# Patient Record
Sex: Female | Born: 1985 | Race: Black or African American | Hispanic: No | Marital: Single | Smoking: Never smoker
Health system: Southern US, Community
[De-identification: ages and names within clinical notes are randomized; demographics above are authoritative.]

## PROBLEM LIST (undated history)

## (undated) DIAGNOSIS — R21 Rash and other nonspecific skin eruption: Secondary | ICD-10-CM

## (undated) DIAGNOSIS — N809 Endometriosis, unspecified: Secondary | ICD-10-CM

## (undated) DIAGNOSIS — G8929 Other chronic pain: Secondary | ICD-10-CM

## (undated) DIAGNOSIS — D649 Anemia, unspecified: Secondary | ICD-10-CM

## (undated) DIAGNOSIS — N803 Endometriosis of pelvic peritoneum: Secondary | ICD-10-CM

## (undated) DIAGNOSIS — N200 Calculus of kidney: Secondary | ICD-10-CM

## (undated) DIAGNOSIS — D352 Benign neoplasm of pituitary gland: Secondary | ICD-10-CM

## (undated) DIAGNOSIS — I10 Essential (primary) hypertension: Secondary | ICD-10-CM

## (undated) DIAGNOSIS — IMO0002 Reserved for concepts with insufficient information to code with codable children: Secondary | ICD-10-CM

## (undated) DIAGNOSIS — R102 Pelvic and perineal pain: Secondary | ICD-10-CM

## (undated) DIAGNOSIS — Z862 Personal history of diseases of the blood and blood-forming organs and certain disorders involving the immune mechanism: Secondary | ICD-10-CM

## (undated) DIAGNOSIS — D573 Sickle-cell trait: Secondary | ICD-10-CM

## (undated) DIAGNOSIS — J45909 Unspecified asthma, uncomplicated: Secondary | ICD-10-CM

## (undated) HISTORY — DX: Endometriosis of pelvic peritoneum: N80.3

## (undated) HISTORY — DX: Benign neoplasm of pituitary gland: D35.2

## (undated) HISTORY — DX: Reserved for concepts with insufficient information to code with codable children: IMO0002

## (undated) HISTORY — PX: EXCISION OF TONGUE LESION: SHX6434

## (undated) HISTORY — DX: Personal history of diseases of the blood and blood-forming organs and certain disorders involving the immune mechanism: Z86.2

## (undated) HISTORY — PX: LAPAROSCOPY ABDOMEN DIAGNOSTIC: PRO50

## (undated) HISTORY — DX: Essential (primary) hypertension: I10

---
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
## 2006-01-04 ENCOUNTER — Emergency Department (HOSPITAL_COMMUNITY): Admission: EM | Admit: 2006-01-04 | Discharge: 2006-01-04 | Payer: Self-pay | Admitting: Family Medicine

## 2006-04-12 ENCOUNTER — Emergency Department (HOSPITAL_COMMUNITY): Admission: EM | Admit: 2006-04-12 | Discharge: 2006-04-13 | Payer: Self-pay | Admitting: Emergency Medicine

## 2006-07-05 ENCOUNTER — Ambulatory Visit: Payer: Self-pay | Admitting: Nurse Practitioner

## 2006-07-06 ENCOUNTER — Ambulatory Visit (HOSPITAL_COMMUNITY): Admission: RE | Admit: 2006-07-06 | Discharge: 2006-07-06 | Payer: Self-pay | Admitting: Family Medicine

## 2006-09-07 ENCOUNTER — Emergency Department (HOSPITAL_COMMUNITY): Admission: EM | Admit: 2006-09-07 | Discharge: 2006-09-07 | Payer: Self-pay | Admitting: Emergency Medicine

## 2006-09-12 ENCOUNTER — Encounter: Payer: Self-pay | Admitting: Emergency Medicine

## 2006-09-14 ENCOUNTER — Inpatient Hospital Stay (HOSPITAL_COMMUNITY): Admission: RE | Admit: 2006-09-14 | Discharge: 2006-09-20 | Payer: Self-pay | Admitting: *Deleted

## 2006-09-14 ENCOUNTER — Ambulatory Visit: Payer: Self-pay | Admitting: *Deleted

## 2007-02-14 ENCOUNTER — Emergency Department (HOSPITAL_COMMUNITY): Admission: EM | Admit: 2007-02-14 | Discharge: 2007-02-14 | Payer: Self-pay | Admitting: Family Medicine

## 2007-02-26 ENCOUNTER — Emergency Department (HOSPITAL_COMMUNITY): Admission: EM | Admit: 2007-02-26 | Discharge: 2007-02-26 | Payer: Self-pay | Admitting: Family Medicine

## 2007-03-09 ENCOUNTER — Inpatient Hospital Stay (HOSPITAL_COMMUNITY): Admission: AD | Admit: 2007-03-09 | Discharge: 2007-03-09 | Payer: Self-pay | Admitting: Gynecology

## 2007-03-10 ENCOUNTER — Emergency Department (HOSPITAL_COMMUNITY): Admission: EM | Admit: 2007-03-10 | Discharge: 2007-03-10 | Payer: Self-pay | Admitting: Family Medicine

## 2007-03-16 ENCOUNTER — Encounter (INDEPENDENT_AMBULATORY_CARE_PROVIDER_SITE_OTHER): Payer: Self-pay | Admitting: *Deleted

## 2007-03-16 ENCOUNTER — Encounter (INDEPENDENT_AMBULATORY_CARE_PROVIDER_SITE_OTHER): Payer: Self-pay | Admitting: Specialist

## 2007-03-16 ENCOUNTER — Ambulatory Visit: Payer: Self-pay | Admitting: *Deleted

## 2007-03-16 ENCOUNTER — Other Ambulatory Visit: Admission: RE | Admit: 2007-03-16 | Discharge: 2007-03-16 | Payer: Self-pay | Admitting: Obstetrics and Gynecology

## 2007-03-18 ENCOUNTER — Inpatient Hospital Stay (HOSPITAL_COMMUNITY): Admission: AD | Admit: 2007-03-18 | Discharge: 2007-03-18 | Payer: Self-pay | Admitting: Obstetrics and Gynecology

## 2007-03-18 ENCOUNTER — Ambulatory Visit: Payer: Self-pay | Admitting: Obstetrics and Gynecology

## 2007-03-21 ENCOUNTER — Inpatient Hospital Stay (HOSPITAL_COMMUNITY): Admission: AD | Admit: 2007-03-21 | Discharge: 2007-03-21 | Payer: Self-pay | Admitting: Obstetrics & Gynecology

## 2007-03-23 ENCOUNTER — Encounter (INDEPENDENT_AMBULATORY_CARE_PROVIDER_SITE_OTHER): Payer: Self-pay | Admitting: Specialist

## 2007-03-23 ENCOUNTER — Ambulatory Visit: Admission: AD | Admit: 2007-03-23 | Discharge: 2007-03-23 | Payer: Self-pay | Admitting: Gynecology

## 2007-03-23 ENCOUNTER — Ambulatory Visit: Payer: Self-pay | Admitting: Gynecology

## 2007-03-23 HISTORY — PX: DILATION AND CURETTAGE OF UTERUS: SHX78

## 2007-03-25 ENCOUNTER — Observation Stay (HOSPITAL_COMMUNITY): Admission: AD | Admit: 2007-03-25 | Discharge: 2007-03-25 | Payer: Self-pay | Admitting: Obstetrics & Gynecology

## 2007-04-04 ENCOUNTER — Inpatient Hospital Stay (HOSPITAL_COMMUNITY): Admission: RE | Admit: 2007-04-04 | Discharge: 2007-04-09 | Payer: Self-pay | Admitting: Psychiatry

## 2007-04-04 ENCOUNTER — Ambulatory Visit: Payer: Self-pay | Admitting: Obstetrics and Gynecology

## 2007-04-04 ENCOUNTER — Ambulatory Visit: Payer: Self-pay | Admitting: Psychiatry

## 2007-04-27 ENCOUNTER — Ambulatory Visit: Payer: Self-pay | Admitting: Obstetrics & Gynecology

## 2007-05-11 ENCOUNTER — Ambulatory Visit: Payer: Self-pay | Admitting: Obstetrics & Gynecology

## 2007-05-20 ENCOUNTER — Ambulatory Visit: Payer: Self-pay | Admitting: Family Medicine

## 2007-05-20 ENCOUNTER — Other Ambulatory Visit: Payer: Self-pay | Admitting: Family Medicine

## 2007-05-21 ENCOUNTER — Emergency Department (HOSPITAL_COMMUNITY): Admission: EM | Admit: 2007-05-21 | Discharge: 2007-05-22 | Payer: Self-pay | Admitting: *Deleted

## 2007-06-21 ENCOUNTER — Emergency Department (HOSPITAL_COMMUNITY): Admission: EM | Admit: 2007-06-21 | Discharge: 2007-06-21 | Payer: Self-pay | Admitting: Emergency Medicine

## 2007-06-25 ENCOUNTER — Emergency Department (HOSPITAL_COMMUNITY): Admission: EM | Admit: 2007-06-25 | Discharge: 2007-06-25 | Payer: Self-pay | Admitting: Emergency Medicine

## 2007-08-09 ENCOUNTER — Emergency Department: Payer: Self-pay | Admitting: Emergency Medicine

## 2007-08-30 ENCOUNTER — Ambulatory Visit: Payer: Self-pay | Admitting: Internal Medicine

## 2007-09-28 ENCOUNTER — Emergency Department: Payer: Self-pay

## 2007-12-05 ENCOUNTER — Emergency Department: Payer: Self-pay | Admitting: Emergency Medicine

## 2008-04-09 ENCOUNTER — Inpatient Hospital Stay (HOSPITAL_COMMUNITY): Admission: AD | Admit: 2008-04-09 | Discharge: 2008-04-09 | Payer: Self-pay | Admitting: Obstetrics & Gynecology

## 2008-04-26 ENCOUNTER — Encounter: Payer: Self-pay | Admitting: Obstetrics & Gynecology

## 2008-04-26 ENCOUNTER — Ambulatory Visit: Payer: Self-pay | Admitting: Obstetrics & Gynecology

## 2008-06-03 ENCOUNTER — Emergency Department: Payer: Self-pay | Admitting: Emergency Medicine

## 2008-07-26 ENCOUNTER — Emergency Department: Payer: Self-pay | Admitting: Emergency Medicine

## 2008-08-05 ENCOUNTER — Emergency Department: Payer: Self-pay | Admitting: Unknown Physician Specialty

## 2008-08-05 ENCOUNTER — Other Ambulatory Visit: Payer: Self-pay

## 2008-10-24 ENCOUNTER — Emergency Department: Payer: Self-pay | Admitting: Emergency Medicine

## 2009-01-08 ENCOUNTER — Emergency Department: Payer: Self-pay | Admitting: Internal Medicine

## 2009-05-17 ENCOUNTER — Emergency Department: Payer: Self-pay | Admitting: Emergency Medicine

## 2009-06-28 ENCOUNTER — Emergency Department (HOSPITAL_COMMUNITY): Admission: EM | Admit: 2009-06-28 | Discharge: 2009-06-28 | Payer: Self-pay | Admitting: Emergency Medicine

## 2009-07-03 ENCOUNTER — Ambulatory Visit: Payer: Self-pay | Admitting: Obstetrics and Gynecology

## 2009-07-04 ENCOUNTER — Ambulatory Visit: Payer: Self-pay | Admitting: Obstetrics and Gynecology

## 2009-07-12 ENCOUNTER — Ambulatory Visit: Payer: Self-pay

## 2009-08-30 ENCOUNTER — Emergency Department: Payer: Self-pay | Admitting: Emergency Medicine

## 2009-12-03 ENCOUNTER — Ambulatory Visit: Payer: Self-pay | Admitting: Family

## 2009-12-03 ENCOUNTER — Inpatient Hospital Stay (HOSPITAL_COMMUNITY): Admission: AD | Admit: 2009-12-03 | Discharge: 2009-12-03 | Payer: Self-pay | Admitting: Family Medicine

## 2010-01-20 ENCOUNTER — Inpatient Hospital Stay (HOSPITAL_COMMUNITY): Admission: AD | Admit: 2010-01-20 | Discharge: 2010-01-20 | Payer: Self-pay | Admitting: Obstetrics and Gynecology

## 2010-01-20 ENCOUNTER — Ambulatory Visit: Payer: Self-pay | Admitting: Physician Assistant

## 2010-02-04 ENCOUNTER — Encounter: Admission: RE | Admit: 2010-02-04 | Discharge: 2010-03-25 | Payer: Self-pay | Admitting: Obstetrics and Gynecology

## 2010-03-10 ENCOUNTER — Ambulatory Visit: Payer: Self-pay | Admitting: Physician Assistant

## 2010-03-10 ENCOUNTER — Inpatient Hospital Stay (HOSPITAL_COMMUNITY): Admission: AD | Admit: 2010-03-10 | Discharge: 2010-03-10 | Payer: Self-pay | Admitting: Family Medicine

## 2010-03-15 ENCOUNTER — Inpatient Hospital Stay (HOSPITAL_COMMUNITY): Admission: AD | Admit: 2010-03-15 | Discharge: 2010-03-15 | Payer: Self-pay | Admitting: Obstetrics & Gynecology

## 2010-06-27 ENCOUNTER — Emergency Department (HOSPITAL_COMMUNITY): Admission: EM | Admit: 2010-06-27 | Discharge: 2010-06-27 | Payer: Self-pay | Admitting: Family Medicine

## 2010-07-12 ENCOUNTER — Emergency Department (HOSPITAL_COMMUNITY): Admission: EM | Admit: 2010-07-12 | Discharge: 2010-07-12 | Payer: Self-pay | Admitting: Emergency Medicine

## 2010-08-12 ENCOUNTER — Emergency Department (HOSPITAL_COMMUNITY): Admission: EM | Admit: 2010-08-12 | Discharge: 2010-08-12 | Payer: Self-pay | Admitting: Emergency Medicine

## 2010-08-28 ENCOUNTER — Emergency Department (HOSPITAL_COMMUNITY): Admission: EM | Admit: 2010-08-28 | Discharge: 2010-08-28 | Payer: Self-pay | Admitting: Family Medicine

## 2010-10-06 ENCOUNTER — Emergency Department (HOSPITAL_COMMUNITY): Admission: EM | Admit: 2010-10-06 | Discharge: 2010-10-06 | Payer: Self-pay | Admitting: Emergency Medicine

## 2010-11-02 ENCOUNTER — Emergency Department (HOSPITAL_COMMUNITY): Admission: EM | Admit: 2010-11-02 | Discharge: 2010-11-02 | Payer: Self-pay | Admitting: Emergency Medicine

## 2010-12-04 ENCOUNTER — Ambulatory Visit: Payer: Self-pay | Admitting: Obstetrics and Gynecology

## 2010-12-18 ENCOUNTER — Ambulatory Visit (HOSPITAL_COMMUNITY)
Admission: RE | Admit: 2010-12-18 | Discharge: 2010-12-18 | Payer: Self-pay | Source: Home / Self Care | Attending: Obstetrics and Gynecology | Admitting: Obstetrics and Gynecology

## 2010-12-18 ENCOUNTER — Ambulatory Visit
Admission: RE | Admit: 2010-12-18 | Discharge: 2010-12-18 | Payer: Self-pay | Source: Home / Self Care | Attending: Obstetrics and Gynecology | Admitting: Obstetrics and Gynecology

## 2011-01-01 ENCOUNTER — Ambulatory Visit: Admit: 2011-01-01 | Payer: Self-pay | Admitting: Obstetrics and Gynecology

## 2011-01-09 ENCOUNTER — Ambulatory Visit
Admission: RE | Admit: 2011-01-09 | Discharge: 2011-01-09 | Payer: Self-pay | Source: Home / Self Care | Attending: Obstetrics & Gynecology | Admitting: Obstetrics & Gynecology

## 2011-01-10 NOTE — Progress Notes (Signed)
Kathryn Suarez, Kathryn Suarez              ACCOUNT NO.:  [id number]  MEDICAL RECORD NO.:  [id number]          PATIENT TYPE:  WOC  LOCATION:  [facility]                   FACILITY:  [facility]  PHYSICIAN:  Catalina Antigua, MD     DATE OF BIRTH:  Apr 23, 1986  DATE OF SERVICE:  01/09/2011                                 CLINIC NOTE  This is a 25 year old nulligravida with longstanding history of dysfunctional uterine bleeding and chronic pelvic pain, who presents today as a followup.  The patient had an IUD placed in October 2011 and has had a few episodes of dysfunctional uterine bleeding since the placement.  The patient was initially managed with supplemental oral contraceptive pill with Loestrin and Yasmin, but herself discontinued secondary to the development of nausea, vomiting, and abdominal pain which she attributed to the birth control pill.  The patient was recently given a prescription for Provera which she states significantly improved her bleeding.  Denies any episodes of vaginal bleeding right now.  The patient desires to continue on Provera.  A prescription for 10 mg of Provera was given to the patient with 2 refills.  The patient is otherwise due to return in May for annual exam.  The patient was advised to return sooner if she develops any concerns or symptoms.  The patient has ready been referred to a Pain Clinic for management of her chronic pelvic pain and the patient will initiate followup care at the [facility] on [address].  The patient was advised in the meantime if she feels like she has sickle cell crisis developing that she should not daily seeking care and to present to emergency room.  The patient verbalized understanding.          ______________________________ Catalina Antigua, MD    PC/MEDQ  D:  01/09/2011  T:  01/10/2011  Job:  [id number]

## 2011-02-24 ENCOUNTER — Ambulatory Visit: Payer: Medicaid Other

## 2011-02-25 LAB — BASIC METABOLIC PANEL
BUN: 6 mg/dL (ref 6–23)
CO2: 28 mEq/L (ref 19–32)
Calcium: 9.1 mg/dL (ref 8.4–10.5)
Chloride: 106 mEq/L (ref 96–112)
Creatinine, Ser: 0.8 mg/dL (ref 0.4–1.2)
GFR calc Af Amer: >60 mL/min (ref >60)
GFR calc non Af Amer: >60 mL/min (ref >60)
Glucose, Bld: 95 mg/dL (ref 70–99)
Potassium: 3.6 mEq/L (ref 3.5–5.1)
Sodium: 139 mEq/L (ref 135–145)

## 2011-02-25 LAB — DIFFERENTIAL
Basophils Absolute: 0 10*3/uL (ref 0.0–0.1)
Basophils Relative: 0 % (ref 0–1)
Eosinophils Absolute: 0.2 10*3/uL (ref 0.0–0.7)
Eosinophils Relative: 3 % (ref 0–5)
Lymphocytes Relative: 17 % (ref 12–46)
Lymphs Abs: 1.5 10*3/uL (ref 0.7–4.0)
Monocytes Absolute: 0.4 10*3/uL (ref 0.1–1.0)
Monocytes Relative: 5 % (ref 3–12)
Neutro Abs: 6.5 10*3/uL (ref 1.7–7.7)
Neutrophils Relative %: 75 % (ref 43–77)

## 2011-02-25 LAB — CBC
HCT: 36.7 % (ref 36.0–46.0)
MCV: 81.1 fL (ref 78.0–100.0)
Platelets: 265 10*3/uL (ref 150–400)
RBC: 4.53 MIL/uL (ref 3.87–5.11)
WBC: 8.6 10*3/uL (ref 4.0–10.5)

## 2011-02-25 LAB — RETICULOCYTES
RBC.: 4.5 MIL/uL (ref 3.87–5.11)
Retic Count, Absolute: 58.5 10*3/uL (ref 19.0–186.0)
Retic Ct Pct: 1.3 % (ref 0.4–3.1)

## 2011-02-26 LAB — CBC
HCT: 34.3 % — ABNORMAL LOW (ref 36.0–46.0)
MCH: 27.4 pg (ref 26.0–34.0)

## 2011-02-26 LAB — DIFFERENTIAL
Basophils Relative: 0 % (ref 0–1)
Monocytes Absolute: 0.7 10*3/uL (ref 0.1–1.0)
Neutro Abs: 8.4 10*3/uL — ABNORMAL HIGH (ref 1.7–7.7)

## 2011-02-28 LAB — BASIC METABOLIC PANEL
CO2: 29 mEq/L (ref 19–32)
Chloride: 108 mEq/L (ref 96–112)
Creatinine, Ser: 0.83 mg/dL (ref 0.4–1.2)
GFR calc Af Amer: >60 mL/min (ref >60)
GFR calc non Af Amer: >60 mL/min (ref >60)
Glucose, Bld: 99 mg/dL (ref 70–99)
Potassium: 4 mEq/L (ref 3.5–5.1)

## 2011-02-28 LAB — URINALYSIS, ROUTINE W REFLEX MICROSCOPIC
Bilirubin Urine: NEGATIVE
Urobilinogen, UA: 0.2 mg/dL (ref 0.0–1.0)
pH: 7 (ref 5.0–8.0)

## 2011-02-28 LAB — DIFFERENTIAL
Basophils Absolute: 0.1 10*3/uL (ref 0.0–0.1)
Eosinophils Absolute: 0.1 10*3/uL (ref 0.0–0.7)
Eosinophils Relative: 2 % (ref 0–5)
Monocytes Relative: 5 % (ref 3–12)

## 2011-02-28 LAB — RETICULOCYTES
RBC.: 4.71 MIL/uL (ref 3.87–5.11)
Retic Ct Pct: 1.2 % (ref 0.4–3.1)

## 2011-02-28 LAB — CBC
HCT: 39.8 % (ref 36.0–46.0)
Platelets: 224 10*3/uL (ref 150–400)
RBC: 4.72 MIL/uL (ref 3.87–5.11)
RDW: 13.4 % (ref 11.5–15.5)

## 2011-02-28 LAB — URINE MICROSCOPIC-ADD ON

## 2011-02-28 LAB — POCT PREGNANCY, URINE: Preg Test, Ur: NEGATIVE

## 2011-03-04 ENCOUNTER — Inpatient Hospital Stay (INDEPENDENT_AMBULATORY_CARE_PROVIDER_SITE_OTHER)
Admission: RE | Admit: 2011-03-04 | Discharge: 2011-03-04 | Disposition: A | Discharge status: Home / Self Care | Payer: Medicaid Other | Source: Ambulatory Visit | Attending: Emergency Medicine | Admitting: Emergency Medicine

## 2011-03-04 DIAGNOSIS — S61209A Unspecified open wound of unspecified finger without damage to nail, initial encounter: Secondary | ICD-10-CM

## 2011-03-04 LAB — URINALYSIS, ROUTINE W REFLEX MICROSCOPIC
Bilirubin Urine: NEGATIVE
Bilirubin Urine: NEGATIVE
Glucose, UA: NEGATIVE mg/dL
Hgb urine dipstick: NEGATIVE
Ketones, ur: 15 mg/dL — AB
Leukocytes, UA: NEGATIVE
Nitrite: NEGATIVE
Protein, ur: NEGATIVE mg/dL
Specific Gravity, Urine: 1.015 (ref 1.005–1.030)
Specific Gravity, Urine: 1.025 (ref 1.005–1.030)
Urobilinogen, UA: 0.2 mg/dL (ref 0.0–1.0)
pH: 5.5 (ref 5.0–8.0)
pH: 6 (ref 5.0–8.0)

## 2011-03-04 LAB — DIFFERENTIAL
Eosinophils Absolute: 0.2 10*3/uL (ref 0.0–0.7)
Eosinophils Relative: 2 % (ref 0–5)
Lymphs Abs: 2.3 10*3/uL (ref 0.7–4.0)
Monocytes Relative: 5 % (ref 3–12)

## 2011-03-04 LAB — CBC
HCT: 44.8 % (ref 36.0–46.0)
MCV: 84.3 fL (ref 78.0–100.0)
RBC: 5.31 MIL/uL — ABNORMAL HIGH (ref 3.87–5.11)
WBC: 12.4 10*3/uL — ABNORMAL HIGH (ref 4.0–10.5)

## 2011-03-08 LAB — URINALYSIS, ROUTINE W REFLEX MICROSCOPIC
Glucose, UA: NEGATIVE mg/dL
Protein, ur: NEGATIVE mg/dL

## 2011-03-08 LAB — WET PREP, GENITAL: Trich, Wet Prep: NONE SEEN

## 2011-03-10 ENCOUNTER — Ambulatory Visit: Payer: Medicaid Other | Attending: Anesthesiology

## 2011-03-13 ENCOUNTER — Emergency Department (HOSPITAL_COMMUNITY)
Admission: EM | Admit: 2011-03-13 | Discharge: 2011-03-13 | Disposition: A | Discharge status: Home / Self Care | Payer: Medicaid Other | Attending: Emergency Medicine | Admitting: Emergency Medicine

## 2011-03-13 DIAGNOSIS — Z23 Encounter for immunization: Secondary | ICD-10-CM | POA: Insufficient documentation

## 2011-03-13 DIAGNOSIS — Z203 Contact with and (suspected) exposure to rabies: Secondary | ICD-10-CM | POA: Insufficient documentation

## 2011-03-16 ENCOUNTER — Inpatient Hospital Stay (INDEPENDENT_AMBULATORY_CARE_PROVIDER_SITE_OTHER)
Admission: RE | Admit: 2011-03-16 | Discharge: 2011-03-16 | Disposition: A | Discharge status: Home / Self Care | Payer: Medicaid Other | Source: Ambulatory Visit | Attending: Family Medicine | Admitting: Family Medicine

## 2011-03-16 DIAGNOSIS — Z23 Encounter for immunization: Secondary | ICD-10-CM

## 2011-03-16 LAB — URINALYSIS, ROUTINE W REFLEX MICROSCOPIC
Glucose, UA: NEGATIVE mg/dL
Hgb urine dipstick: NEGATIVE
Protein, ur: NEGATIVE mg/dL
Specific Gravity, Urine: 1.01 (ref 1.005–1.030)
pH: 6.5 (ref 5.0–8.0)

## 2011-03-16 LAB — GC/CHLAMYDIA PROBE AMP, GENITAL: GC Probe Amp, Genital: NEGATIVE

## 2011-03-16 LAB — WET PREP, GENITAL: Trich, Wet Prep: NONE SEEN

## 2011-03-20 ENCOUNTER — Inpatient Hospital Stay (INDEPENDENT_AMBULATORY_CARE_PROVIDER_SITE_OTHER)
Admission: RE | Admit: 2011-03-20 | Discharge: 2011-03-20 | Disposition: A | Discharge status: Home / Self Care | Payer: Medicaid Other | Source: Ambulatory Visit | Attending: Family Medicine | Admitting: Family Medicine

## 2011-03-20 DIAGNOSIS — Z23 Encounter for immunization: Secondary | ICD-10-CM

## 2011-03-22 LAB — POCT PREGNANCY, URINE: Preg Test, Ur: NEGATIVE

## 2011-03-22 LAB — URINALYSIS, ROUTINE W REFLEX MICROSCOPIC
Glucose, UA: NEGATIVE mg/dL
Specific Gravity, Urine: 1.017 (ref 1.005–1.030)
pH: 6 (ref 5.0–8.0)

## 2011-03-27 ENCOUNTER — Inpatient Hospital Stay (INDEPENDENT_AMBULATORY_CARE_PROVIDER_SITE_OTHER)
Admission: RE | Admit: 2011-03-27 | Discharge: 2011-03-27 | Disposition: A | Discharge status: Home / Self Care | Payer: Medicaid Other | Source: Ambulatory Visit | Attending: Emergency Medicine | Admitting: Emergency Medicine

## 2011-03-27 DIAGNOSIS — Z23 Encounter for immunization: Secondary | ICD-10-CM

## 2011-03-27 DIAGNOSIS — J069 Acute upper respiratory infection, unspecified: Secondary | ICD-10-CM

## 2011-04-28 NOTE — Group Therapy Note (Signed)
NAMESHANIN, SZYMANOWSKI NO.:  [id number]   MEDICAL RECORD NO.:  [id number]          PATIENT TYPE:  WOC   LOCATION:  [facility]                   FACILITY:  [facility]   PHYSICIAN:  Allie Bossier, MD        DATE OF BIRTH:  [birth year]   DATE OF SERVICE:                                  CLINIC NOTE   Linder is a 25 year old single black gravida 0 who comes in for annual  exam and her Depo.  She is in time for her Depo and has no GYN  complaints.   PAST MEDICAL HISTORY:  Schizophrenia/bipolar/ADHD, sickle cell trait  with anemia in the distant past, history of dysfunctional uterine  bleeding.  Obesity is  another problem.   MEDICATIONS:  She takes Motrin 800 as necessary, Depo-Provera, last on  February 06, 2008, in [place].  This is documented.  Iron pill daily,  Abilify, trazodone, Topamax, and albuterol and Nasonex.   ALLERGIES:  No known drug allergies.  SHE HAS A QUESTION OF A LATEX  ALLERGY.   SOCIAL HISTORY:  Negative for tobacco, alcohol, drug use.   REVIEW OF SYSTEMS:  She is strictly in a lesbian relationship.  She is  currently unemployed and on disability.  She only has vaginal bleeding  with orgasm at this point.   FAMILY HISTORY:  Positive for with parents with sickle cell trait.  There is a question of a GYN cancer in a grandmother.   PAST SURGICAL HISTORY:  She had a D&C in 2008.  She has had a  transfusion of packed red blood cells in 2008 x 2, one in [place] and  one at [facility] [place].   PHYSICAL EXAMINATION:  VITAL SIGNS:  Weight 200 pounds, height 5'3,  blood pressure 113/79.  HEENT:  Normal.  HEART:  Regular rate and rhythm.  BREAST:  Exam normal bilaterally.  LUNGS:  Clear to auscultation bilaterally.  ABDOMEN:  Obese, benign.  EXTERNAL GENITALIA:  No lesions.  Cervix nulliparous, no lesions.  Uterus normal size and shape, mid plane, nontender.  Adnexa nontender.  No masses.   ASSESSMENT/PLAN:  Annual exam.  Checked a  Pap smear with cervical  cultures.  With regard to management of her dysfunctional uterine  bleeding, she will continue on her Depo-Provera every 3 months.      Allie Bossier, MD     MCD/MEDQ  D:  04/26/2008  T:  04/26/2008  Job:  [id number]

## 2011-05-01 NOTE — Discharge Summary (Signed)
NAMESHARLYNN, Suarez              ACCOUNT NO.:  [id number]   MEDICAL RECORD NO.:  [id number]          PATIENT TYPE:  IPS   LOCATION:  0301                          [facility]ACILITY[facility]   PHYSICIAN:  Anselm Jungling, MD  DATE OF BIRTH:  [birth year]   DATE OF ADMISSION:  04/04/2007  DATE OF DISCHARGE:  04/09/2007                               DISCHARGE SUMMARY   IDENTIFYING DATA AND REASON FOR ADMISSION:  This was an inpatient  psychiatric admission for Kathryn Suarez, 25 year old single, homeless,  unemployed female who was admitted due to increasing depression and  suicidal ideation, in the context of medication nonadherence.  This was  her second BAC admission, the last one having been in fall of 2007.  The  patient admitted she had been off her medication regimen for 1 month,  which had consisted of Abilify, Prozac, and trazodone.  She reported  auditory hallucinations telling her to kill others, but she had no  desire, intent, or plan to act on those command hallucinations.  She  denied drug and alcohol abuse.  Please refer to the admission note for  further details pertaining to the symptoms, circumstances and history  that led to her hospitalization.  She was given initial Axis I diagnoses  of schizoaffective disorder, depressed, with psychotic features.   MEDICAL AND LABORATORY:  The patient was medically and physically  assessed by the psychiatric nurse practitioner.  She came to [place] with a  history of anemia, and a recent D and C procedure, with some persistent  vaginal bleeding.  This was followed closely by the nurse practitioner  staff during the patient's inpatient stay.  Aside from this, there were  no acute medical issues.   HOSPITAL COURSE:  The patient was admitted to the adult inpatient  psychiatric service.  She presented as a moderately obese, but normally  developed and healthy-appearing young woman who was alert, fully  oriented, but not a very good historian.  She  acknowledged suicidal  ideation but contracted for safety on the unit.  She acknowledged  auditory hallucinations telling her to kill people, but she indicated  that she did not want to act on these and did not think she would have  difficulty controlling or resisting these ideas.  She did not display  any outward signs or symptoms of psychosis.  She was generally pleasant,  but quite reserved, and quite sad.  She verbalized a strong desire for  help.   The patient was restarted on a regimen of Prozac 20 mg daily, Abilify to  20 mg nightly, and trazodone 100 mg nightly.  These were well tolerated.   The first 2 to 3 days of her hospital stay, she stayed in bed quite a  lot, stating that she felt tired, and stating that she was still having  some abdominal discomfort, and some vaginal bleeding.  She did not  appear to be sedated.  However, towards the latter part of her inpatient  stay, she was up, active in the unit and a better participant.  Her  Abilify had been increased from 10 to 20  mg nightly, and she reported  that this improved her sleep and reduced her irritability level.   The social work and case management staff were in touch with the  patient's mother.   DISCHARGE/PLAN:  Aftercare was discussed.  The patient appeared  appropriate for discharge on the 6th hospital day.  At that time, she  was absent auditory hallucinations and suicidal ideation.   AFTERCARE:  The plan was for the patient to follow up with Dr. Lang Snow at  the [facility] on Apr 14, 2007.  She was also to follow up with Dr.  Okey Dupre for medical issues.   DISCHARGE MEDICATIONS:  1. Prozac 20 mg daily.  2. Abilify 20 mg nightly.  3. Trazodone 100 mg nightly.  4. Slow FE 160 mg twice daily.  5. Necon contraceptive daily.   DISCHARGE DIAGNOSES:  AXIS I:  Schizoaffective disorder, most recently  depressed, with psychotic features, resolving.  AXIS II:  Deferred.  AXIS III:  History of anemia,  menorrhagia.  AXIS IV:  Stressors severe.  AXIS V:  Global assessment of functioning on discharge 65.      Anselm Jungling, MD  Electronically Signed     SPB/MEDQ  D:  04/11/2007  T:  04/11/2007  Job:  [id number]

## 2011-05-01 NOTE — Op Note (Signed)
Kathryn Suarez, Kathryn Suarez              ACCOUNT NO.:  [id number]   MEDICAL RECORD NO.:  [id number]          PATIENT TYPE:  AMB   LOCATION:  [facility]                          [facility]   PHYSICIAN:  Ginger Carne, MD  DATE OF BIRTH:  [birth year]   DATE OF PROCEDURE:  03/23/2007  DATE OF DISCHARGE:                               OPERATIVE REPORT   PREOPERATIVE DIAGNOSIS:  Menometrorrhagia.   POSTOPERATIVE DIAGNOSIS:  Menometrorrhagia.   PROCEDURE:  Dilation and curettage.   SURGEON:  Ginger Carne, M.D.   ASSISTANT:  None.   COMPLICATIONS:  None immediate.   ESTIMATED BLOOD LOSS:  Minimal.   SPECIMEN:  Uterine curettings.   ANESTHESIA:  MAC and local 3% Xylocaine paracervical block.   OPERATIVE FINDINGS:  External genitalia, vulva and vagina normal.  Cervix smooth without erosions or lesions.  Uterus sounded to 8 cm,  normal sized cavity.  The uterus was anteverted and flexed with smooth  contour.  Both adnexa were palpable and found to be normal.   OPERATIVE PROCEDURE:  The patient was prepped and draped in the usual  fashion and placed in the lithotomy position.  Betadine solution was  used for antiseptic.  The patient was catheterized prior to the  procedure.  After adequate MAC analgesia, a paracervical block was  utilized. Dilatation to accommodate a curet was performed.  Specimens  were sent to pathology.  At the end of the procedure, minimal bleeding  noted.  Copious amount of tissue was noted.  The patient tolerated the  procedure well and returned to the post anesthesia recovery room in  excellent condition.      Ginger Carne, MD  Electronically Signed     SHB/MEDQ  D:  03/23/2007  T:  03/23/2007  Job:  [id number]

## 2011-05-01 NOTE — H&P (Signed)
NAMEMCKINSEY, KEAGLE              ACCOUNT NO.:  [id number]   MEDICAL RECORD NO.:  [id number]          PATIENT TYPE:  IPS   LOCATION:  0301                          [facility]ILITY[facility]   PHYSICIAN:  Anselm Jungling, MD  DATE OF BIRTH:  1986-05-14   DATE OF ADMISSION:  04/04/2007  DATE OF DISCHARGE:                       PSYCHIATRIC ADMISSION ASSESSMENT   HISTORY OF PRESENT ILLNESS:  The patient presents with a history  depression and suicidal thoughts with no specific plan.  Also having  thoughts to harm others as well.  No one in particular.  She states she  has been off her medications for at least 1 month.  She has been  experiencing auditory hallucinations to hurt others but with no specific  plan.  Denies any substance abuse.  She is having problems with living  arrangements, has been living from friend to friend.  She has little  social support.  She has had a history of vaginal bleeding which she has  had some recent workup.   PAST PSYCHIATRIC HISTORY:  Second admission to [facility],  was here in October 2007.  She is an outpatient at [facility].  Has a history of a suicide attempt by overdosing and  cutting her wrist.   SOCIAL HISTORY:  A 25 year old single African female, has no children,  has been living from friend to friend.  Completed eleventh grade.  No  legal problems.   FAMILY HISTORY:  None aware.   ALCOHOL AND DRUG HISTORY:  Nonsmoker.  Denies any alcohol or drug use.   PRIMARY CARE Zarek Relph:  Currently has been seeing Dr. Okey Dupre over at  [facility] for her vaginal bleeding.  She does not have a primary  care Nuriya Stuck.   MEDICAL PROBLEMS:  Recent vaginal bleeding and asthma.   MEDICATIONS:  Had been on Abilify 10 mg, Prozac 20, trazodone 100 mg at  bedtime.  Has been off her medications for at least 1 month.  Was also  recently prescribed an iron pill.   DRUG ALLERGIES:  LATEX TAPE.   PAST MEDICAL HISTORY:  Vaginal  bleeding, received a blood transfusion.   PAST SURGICAL HISTORY:  D&C.   REVIEW OF SYSTEMS:  No fever, no chills.  No chest pain.  Positive for  shortness of breath.  History of asthma.  Nonsmoker.  No nausea or  vomiting.  No diarrhea.  Positive for depression.  Positive for vaginal  bleeding.  No seizures.  No muscle weakness.  No falls.   PHYSICAL EXAMINATION:  Temperature is 98.2, heart rate 89, respirations  18, blood pressure 140/76, 5 feet 4 inches tall, 196 pounds.  GENERAL APPEARANCE:  Overweight female in no acute distress.  NECK AND HEAD:  The patient has a piercing to her left eyebrow.  She has  areas of dark pigmentation to her forehead and cheek.  CHEST:  Clear.  BREAST EXAM:  Deferred.  HEART:  Regular rate and rhythm.  ABDOMEN:  Soft, nontender abdomen.  GU:  There is a moderate amount of bleeding with a few dime-sized clots  noted.  EXTREMITIES:  The  patient moves all extremities.  No clubbing, no  deformities.  __________.  SKIN:  Warm and dry.  Again with some areas of dark pigmentation noted  to her forehead and cheeks.  NEUROLOGICAL:  Findings are intact and  nonfocal.   Her hemoglobin is 9.3, hematocrit 29.4, MCV 69, RDW is elevated at  27.25.  Fibrinogen is 523, glucose is 100, albumin 3.1.   MENTAL STATUS EXAM:  She is fully alert.  She is cooperative, fair eye  contact.  She is currently dressed in hospital scrubs.  Speech is soft-  spoken and clear.  Mood is depressed.  The patient appears sad.  Thought  process:  Endorsing suicidal and homicidal thoughts with auditory  hallucinations.  Insight:  Appears to be actively responding, is not  hostile.  She is again calm and cooperative.  Cognitive function intact.  Memory is good.  Judgment is good.  Insight is good.  She appears  sincere.   AXIS I:  Major depressive disorder with psychotic features.  AXIS II:  Deferred.  AXIS III:  Vaginal bleeding; asthma.  AXIS IV:  Problems with housing; other  psychosocial problems related to  lack of primary support; medical problems.  AXIS V:  Current is 30.   PLAN:  Contract for safety.  Stabilize her mood/thinking.  We will  resume the patient's medications and reinforce medication compliance.  Will monitor her vaginal flow.  Will contact Dr. Okey Dupre for followup and  any other recommendations.  Will put patient on a pad count at this  time.  Will encourage fluids.  The patient is to follow up at [facility] for followup.  Will consider a family session with the  patient's support group.  Case manager is to look at any housing  arrangements.  Intended length of stay is 3 to 5 days.      Landry Corporal, N.P.      Anselm Jungling, MD  Electronically Signed    JO/MEDQ  D:  04/07/2007  T:  04/07/2007  Job:  [id number]

## 2011-05-14 ENCOUNTER — Ambulatory Visit: Payer: Medicaid Other | Admitting: Occupational Therapy

## 2011-07-03 ENCOUNTER — Telehealth: Payer: Self-pay | Admitting: *Deleted

## 2011-07-03 NOTE — Telephone Encounter (Signed)
Spoke w/pt-she states she has had constipation x2 days.  I advised Miralax per package instructions. Pt voiced understanding

## 2011-09-02 ENCOUNTER — Ambulatory Visit: Payer: Medicaid Other | Admitting: Physician Assistant

## 2011-09-08 LAB — URINALYSIS, ROUTINE W REFLEX MICROSCOPIC
Nitrite: NEGATIVE
Protein, ur: NEGATIVE
Specific Gravity, Urine: 1.02
Urobilinogen, UA: 0.2

## 2011-09-08 LAB — WET PREP, GENITAL: Trich, Wet Prep: NONE SEEN

## 2011-09-08 LAB — URINE MICROSCOPIC-ADD ON

## 2011-09-24 ENCOUNTER — Encounter: Payer: Self-pay | Admitting: *Deleted

## 2011-09-28 ENCOUNTER — Ambulatory Visit (INDEPENDENT_AMBULATORY_CARE_PROVIDER_SITE_OTHER): Payer: Medicaid Other | Admitting: Physician Assistant

## 2011-09-28 ENCOUNTER — Other Ambulatory Visit: Payer: Self-pay | Admitting: Physician Assistant

## 2011-09-28 ENCOUNTER — Encounter: Payer: Self-pay | Admitting: Physician Assistant

## 2011-09-28 VITALS — BP 133/84 | HR 95 | Temp 97.4°F | Wt 231.6 lb

## 2011-09-28 DIAGNOSIS — N6459 Other signs and symptoms in breast: Secondary | ICD-10-CM

## 2011-09-28 DIAGNOSIS — R102 Pelvic and perineal pain: Secondary | ICD-10-CM

## 2011-09-28 DIAGNOSIS — Z113 Encounter for screening for infections with a predominantly sexual mode of transmission: Secondary | ICD-10-CM

## 2011-09-28 DIAGNOSIS — N6452 Nipple discharge: Secondary | ICD-10-CM

## 2011-09-28 DIAGNOSIS — N949 Unspecified condition associated with female genital organs and menstrual cycle: Secondary | ICD-10-CM

## 2011-09-28 NOTE — Progress Notes (Signed)
Chief Complaint:  Nipple discharge  Kathryn Suarez is  25 y.o. G0P0.  Patient's last menstrual period was 09/28/2011.Marland Kitchen  Her pregnancy status is negative.  She presents complaining of nipple discharge. Onset is described as insidious and has been present for  Several  Years, intermittent   Obstetrical/Gynecological History: OB History    Grav Para Term Preterm Abortions TAB SAB Ect Mult Living   0               Past Medical History: Past Medical History  Diagnosis Date  . Chronic kidney disease     kidney stones  . Mental disorder   . Asthma   . Hypertension   . Blood transfusion without reported diagnosis   . Sickle cell anemia     Past Surgical History: Past Surgical History  Procedure Date  . Dilation and curettage of uterus     Family History: Family History  Problem Relation Age of Onset  . Hypertension Mother     Social History: History  Substance Use Topics  . Smoking status: Never Smoker   . Smokeless tobacco: Never Used  . Alcohol Use: No    Allergies:  Allergies  Allergen Reactions  . Latex      Review of Systems - Negative except what has been reviewed in the HPI  Physical Exam   Blood pressure 133/84, pulse 95, temperature 97.4 F (36.3 C), temperature source Oral, weight 231 lb 9.6 oz (105.053 kg), last menstrual period 09/28/2011.  General: General appearance - alert, well appearing, and in no distress, oriented to person, place, and time and overweight Mental status - alert, oriented to person, place, and time, normal mood, behavior, speech, dress, motor activity, and thought processes, affect appropriate to mood Abdomen - soft, nontender, nondistended, no masses or organomegaly Breasts - Non tender, no masses or dimpling, no retractions, flat nipples expressing milky discharge with manipulation  Focused Gynecological Exam: VULVA: normal appearing vulva with no masses, tenderness or lesions, VAGINA: normal appearing vagina with normal  color and discharge, no lesions, CERVIX: normal appearing cervix without discharge or lesions, UTERUS: uterus is normal size, shape, consistency and nontender, ADNEXA: normal adnexa in size, nontender and no masses     Assessment: Nipple Discharge Chronic Pelvic Pain Routine STI Screening  Plan: Labs drawn, discharge spec sent for analysis FU in 2-3 weeks to review results  Shallyn Constancio E. 09/28/2011,3:03 PM

## 2011-09-29 LAB — COMPREHENSIVE METABOLIC PANEL
Albumin: 4.3 g/dL (ref 3.5–5.2)
Alkaline Phosphatase: 79 U/L (ref 39–117)
BUN: 12 mg/dL (ref 6–23)
Calcium: 9.3 mg/dL (ref 8.4–10.5)
Glucose, Bld: 84 mg/dL (ref 70–99)
Potassium: 4.1 mEq/L (ref 3.5–5.3)

## 2011-09-29 LAB — BASIC METABOLIC PANEL
CO2: 26
Chloride: 105
Creatinine, Ser: 0.84
GFR calc Af Amer: >60
Potassium: 3.9

## 2011-09-29 LAB — CBC
HCT: 34.9 — ABNORMAL LOW
Platelets: 260
RBC: 5.06
WBC: 9.5

## 2011-09-29 LAB — RAPID URINE DRUG SCREEN, HOSP PERFORMED
Amphetamines: NOT DETECTED
Cocaine: NOT DETECTED
Opiates: NOT DETECTED
Tetrahydrocannabinol: NOT DETECTED

## 2011-09-29 LAB — DIFFERENTIAL
Basophils Absolute: 0.1
Eosinophils Relative: 2
Lymphocytes Relative: 24
Monocytes Relative: 6

## 2011-09-29 LAB — PROLACTIN: Prolactin: 15.4 ng/mL

## 2011-09-29 LAB — POCT PREGNANCY, URINE: Preg Test, Ur: NEGATIVE

## 2011-10-01 LAB — URINALYSIS, ROUTINE W REFLEX MICROSCOPIC
Ketones, ur: NEGATIVE
Nitrite: NEGATIVE
Protein, ur: NEGATIVE
Urobilinogen, UA: 0.2

## 2011-10-01 LAB — RAPID URINE DRUG SCREEN, HOSP PERFORMED
Amphetamines: NOT DETECTED
Benzodiazepines: NOT DETECTED
Cocaine: NOT DETECTED
Opiates: NOT DETECTED
Tetrahydrocannabinol: NOT DETECTED

## 2011-10-01 LAB — PREGNANCY, URINE: Preg Test, Ur: NEGATIVE

## 2011-10-01 LAB — WOUND CULTURE: Gram Stain: NONE SEEN

## 2011-10-01 LAB — CBC
MCV: 67.7 — ABNORMAL LOW
Platelets: 275
WBC: 9.3

## 2011-10-01 LAB — URINE CULTURE

## 2011-10-01 LAB — DIFFERENTIAL
Basophils Relative: 2 — ABNORMAL HIGH
Eosinophils Absolute: 0.2
Lymphs Abs: 2.5
Neutrophils Relative %: 63

## 2011-10-01 LAB — POCT PREGNANCY, URINE
Operator id: 194561
Preg Test, Ur: NEGATIVE

## 2011-10-01 LAB — BASIC METABOLIC PANEL
BUN: 5 — ABNORMAL LOW
Chloride: 108
Creatinine, Ser: 0.89
GFR calc non Af Amer: >60

## 2011-10-02 DIAGNOSIS — R102 Pelvic and perineal pain: Secondary | ICD-10-CM | POA: Insufficient documentation

## 2011-10-02 DIAGNOSIS — G8929 Other chronic pain: Secondary | ICD-10-CM | POA: Insufficient documentation

## 2011-10-06 ENCOUNTER — Telehealth: Payer: Self-pay | Admitting: *Deleted

## 2011-10-06 NOTE — Telephone Encounter (Signed)
Called pt and pt informed me that she was massaging breast to relieve discomfort and blood came out of her nipples.  The pt does have a f/u appt on 10/16/11.  I informed pt to please go to MAU for evaluation on this abnormal discharge.  Pt stated that "this was the first time she had ever had blood come from her nipples".  Pt stated that she was going to MAU today.  And I also stated to keep her follow up appt. Pt stated understanding.

## 2011-10-06 NOTE — Telephone Encounter (Signed)
Pt left message to call back- did not state the reason.

## 2011-10-16 ENCOUNTER — Ambulatory Visit (INDEPENDENT_AMBULATORY_CARE_PROVIDER_SITE_OTHER): Payer: Medicaid Other | Admitting: Obstetrics and Gynecology

## 2011-10-16 ENCOUNTER — Encounter: Payer: Self-pay | Admitting: Obstetrics and Gynecology

## 2011-10-16 DIAGNOSIS — R300 Dysuria: Secondary | ICD-10-CM

## 2011-10-16 DIAGNOSIS — N6459 Other signs and symptoms in breast: Secondary | ICD-10-CM

## 2011-10-16 DIAGNOSIS — N6452 Nipple discharge: Secondary | ICD-10-CM

## 2011-10-16 NOTE — Progress Notes (Signed)
25 yo G0 with bilateral breast milky discharge presenting today to discuss results. Results reviewed with the patient. Patient reports that because of this issue she has been touching her breast more often. It was explained to the patient that breast stimulation will cause further production of breast discharge. Patient advised to decrease breast stimulation, wear tight bra and even ice packs. Patient advised to RTC in 3-4 weeks of discharge persists.  Patient also complaining of dysuria and urgency. Urine culture ordered. Patient will be contacted with any abnormal results.

## 2011-10-18 LAB — URINE CULTURE

## 2011-11-26 ENCOUNTER — Ambulatory Visit: Payer: Medicaid Other | Admitting: Obstetrics & Gynecology

## 2011-12-16 ENCOUNTER — Ambulatory Visit: Payer: Medicaid Other | Admitting: Obstetrics & Gynecology

## 2012-01-13 ENCOUNTER — Ambulatory Visit: Payer: Medicaid Other | Admitting: Advanced Practice Midwife

## 2012-01-14 ENCOUNTER — Encounter (HOSPITAL_COMMUNITY): Payer: Self-pay

## 2012-01-14 ENCOUNTER — Inpatient Hospital Stay (HOSPITAL_COMMUNITY)
Admission: AD | Admit: 2012-01-14 | Discharge: 2012-01-14 | Disposition: A | Discharge status: Home / Self Care | Payer: Medicaid Other | Source: Ambulatory Visit | Attending: Obstetrics and Gynecology | Admitting: Obstetrics and Gynecology

## 2012-01-14 DIAGNOSIS — R1084 Generalized abdominal pain: Secondary | ICD-10-CM

## 2012-01-14 DIAGNOSIS — R109 Unspecified abdominal pain: Secondary | ICD-10-CM | POA: Insufficient documentation

## 2012-01-14 DIAGNOSIS — Z30432 Encounter for removal of intrauterine contraceptive device: Secondary | ICD-10-CM

## 2012-01-14 MED ORDER — IBUPROFEN 800 MG PO TABS
800.0000 mg | ORAL_TABLET | Freq: Once | ORAL | Status: AC
  Administered 2012-01-14: 800 mg via ORAL
  Filled 2012-01-14: qty 1

## 2012-01-14 NOTE — Progress Notes (Signed)
Patient is in asking for her iud to be removed. She states that had it palced [facility] in 2010. She c/o severe cramping and irritation.

## 2012-01-14 NOTE — ED Provider Notes (Signed)
History   Pt presents today wishing to have her IUD removed. She states she has an appt with a fertility clinic and she knows she will have to have it removed. She also states that she has continued to have pain off and on since having the IUD placed. She denies abnormal bleeding, vag dc, irritation, fever, dysuria, or any other sx at this time.  Chief Complaint  Patient presents with  . Abdominal Cramping   HPI  OB History    Grav Para Term Preterm Abortions TAB SAB Ect Mult Living   0               Past Medical History  Diagnosis Date  . Chronic kidney disease     kidney stones  . Mental disorder   . Asthma   . Hypertension   . Blood transfusion without reported diagnosis   . Sickle cell anemia     Past Surgical History  Procedure Date  . Dilation and curettage of uterus     Family History  Problem Relation Age of Onset  . Hypertension Mother     History  Substance Use Topics  . Smoking status: Never Smoker   . Smokeless tobacco: Never Used  . Alcohol Use: No    Allergies:  Allergies  Allergen Reactions  . Latex Rash    Prescriptions prior to admission  Medication Sig Dispense Refill  . albuterol (PROVENTIL HFA;VENTOLIN HFA) 108 (90 BASE) MCG/ACT inhaler Inhale 2 puffs into the lungs every 6 (six) hours as needed. Shortness of breath      . ibuprofen (ADVIL,MOTRIN) 800 MG tablet Take 800 mg by mouth every 8 (eight) hours as needed. For pain      . Multiple Vitamin (MULTIVITAMIN) tablet Take 1 tablet by mouth daily.        Marland Kitchen oxyCODONE-acetaminophen (PERCOCET) 5-325 MG per tablet Take 1 tablet by mouth every 4 (four) hours as needed. pain        Review of Systems  Constitutional: Negative for fever.  Eyes: Negative for blurred vision and double vision.  Respiratory: Negative for cough, hemoptysis, sputum production, shortness of breath and wheezing.   Cardiovascular: Negative for chest pain and palpitations.  Gastrointestinal: Positive for abdominal pain.  Negative for nausea, vomiting, diarrhea and constipation.  Genitourinary: Negative for dysuria, urgency, frequency and hematuria.  Neurological: Negative for dizziness and headaches.  Psychiatric/Behavioral: Negative for depression and suicidal ideas.   Physical Exam   Blood pressure 128/87, pulse 100, temperature 98.8 F (37.1 C), temperature source Oral, resp. rate 16, height 5\' 4"  (1.626 m), weight 236 lb 3.2 oz (107.14 kg), last menstrual period 12/16/2011, SpO2 97.00%.  Physical Exam  Nursing note and vitals reviewed. Constitutional: She is oriented to person, place, and time. She appears well-developed and well-nourished. No distress.  HENT:  Head: Normocephalic and atraumatic.  Eyes: EOM are normal. Pupils are equal, round, and reactive to light.  GI: Soft. She exhibits no distension and no mass. There is no tenderness. There is no rebound and no guarding.  Genitourinary: No bleeding around the vagina. No vaginal discharge found.       IUD strings easily visualized. IUD was successfully removed without difficulty using ring forceps. Pt tolerated this procedure well. Uterus NL size and shape. No adnexal masses.  Neurological: She is alert and oriented to person, place, and time.  Skin: Skin is warm and dry. She is not diaphoretic.  Psychiatric: She has a normal mood and affect. Her behavior  is normal. Judgment and thought content normal.    MAU Course  Procedures  Results for orders placed during the hospital encounter of 01/14/12 (from the past 24 hour(s))  POCT PREGNANCY, URINE     Status: Normal   Collection Time   01/14/12  9:12 AM      Component Value Range   Preg Test, Ur NEGATIVE  NEGATIVE      Assessment and Plan  IUD removal: discussed safe sex precautions. She has f/u scheduled in the GYN clinic. Discussed diet, activity,risks, and precautions.  Clinton Gallant. Rice III, DrHSc, MPAS, PA-C  01/14/2012, 9:01 AM   Henrietta Hoover, PA 01/14/12 [phone]

## 2012-01-14 NOTE — Progress Notes (Signed)
Patient states she has had a Mirena since 2009. Has been having increasing abdominal cramping and would like it to be removed.

## 2012-01-15 NOTE — ED Provider Notes (Signed)
Agree with above note.  Kathryn Suarez 01/15/2012 6:12 AM   

## 2012-01-28 ENCOUNTER — Encounter: Payer: Self-pay | Admitting: Physician Assistant

## 2012-01-28 ENCOUNTER — Ambulatory Visit (INDEPENDENT_AMBULATORY_CARE_PROVIDER_SITE_OTHER): Payer: Medicaid Other | Admitting: Physician Assistant

## 2012-01-28 ENCOUNTER — Other Ambulatory Visit (HOSPITAL_COMMUNITY)
Admission: RE | Admit: 2012-01-28 | Discharge: 2012-01-28 | Disposition: A | Discharge status: Home / Self Care | Payer: Medicaid Other | Source: Ambulatory Visit | Attending: Advanced Practice Midwife | Admitting: Advanced Practice Midwife

## 2012-01-28 VITALS — BP 139/96 | HR 94 | Temp 98.6°F | Ht 63.0 in | Wt 234.7 lb

## 2012-01-28 DIAGNOSIS — Z01419 Encounter for gynecological examination (general) (routine) without abnormal findings: Secondary | ICD-10-CM | POA: Insufficient documentation

## 2012-01-28 DIAGNOSIS — N979 Female infertility, unspecified: Secondary | ICD-10-CM

## 2012-01-28 DIAGNOSIS — N970 Female infertility associated with anovulation: Secondary | ICD-10-CM

## 2012-01-28 DIAGNOSIS — Z3141 Encounter for fertility testing: Secondary | ICD-10-CM

## 2012-01-28 DIAGNOSIS — Z113 Encounter for screening for infections with a predominantly sexual mode of transmission: Secondary | ICD-10-CM | POA: Insufficient documentation

## 2012-01-28 LAB — COMPREHENSIVE METABOLIC PANEL
ALT: 12 U/L (ref 0–35)
Albumin: 4.4 g/dL (ref 3.5–5.2)
CO2: 28 mEq/L (ref 19–32)
Calcium: 9.4 mg/dL (ref 8.4–10.5)
Chloride: 102 mEq/L (ref 96–112)
Potassium: 3.5 mEq/L (ref 3.5–5.3)
Sodium: 141 mEq/L (ref 135–145)
Total Protein: 7.4 g/dL (ref 6.0–8.3)

## 2012-01-28 LAB — TSH: TSH: 2.858 u[IU]/mL (ref 0.350–4.500)

## 2012-01-28 NOTE — Patient Instructions (Signed)
Home Tests Home testing is a growing area of health care that places new responsibilities on the health care consumer. If you need additional information or would like to learn about recently approved tests, visit the FDA web page Over-the-Counter In Vitro Diagnostic Devices. Another FDA page, Currently Waived Analytes, includes links to a broad range of tests kits and devices that are identified by brand. Not all are for home use -- those that are will be clearly marked. The following home testing kits are available.  KEY: This key applies to all of the tests listed below   SA=stand-alone test, results available to consumer in the home   OTC=over-the-counter, no prescription needed.   N/A= not applicable  Glucose  Condition: Diabetes   Purpose: To monitor blood sugar levels   Format: SA   Availability: OTC, physician recommendation  Comments: Home testing for diabetes has advanced from urine testing to whole blood testing. Less invasive or painful procedures, such as alternate site testing meters and those that use infrared technology to "read" blood sugar through the skin, are becoming more common. In 2001, the FDA approved a watch-like device for testing blood sugar.  Cholesterol  Condition: Heart disease   Purpose: Screening for total cholesterol in blood   Format: SA   Availability: OTC  Comments: Does not separate out cholesterol. Natural variations in cholesterol may make test results hard to interpret. If results indicate potential heart disease, see your doctor.  Luteinizing Hormone   Condition: Ovulation   Purpose: Predicting time when woman is most likely to be fertile   Format: SA   Availability: OTC  Comments: Certain monitoring devices can store information and accessed by the couple and their health provider for retrospective review.  Human Chorionic Gonadotropin (hCG)   Condition: Pregnancy   Purpose: Screening for possibility of pregnancy   Format: SA     Availability: OTC  Comments: Results should be confirmed by doctor or laboratory testing  Fecal Occult Blood   Condition: Colo-rectal cancer   Purpose: Screening for blood in the stool   Format: SA   Availability: OTC  Comments: Subject to false positives; substances in the body or in your system can affect results.  Prothrombin Time (Coagulation Testing)   Condition: Heart disease, stroke, atherosclerosis   Purpose: Measuring the concentration of blood thinner in the body   Format: SA; portable, battery-operated device.   Availability: By prescription  Comments: Relatively new; safety and efficacy are still being evaluated. Requires considerable patient training and compliance to be useful. Cost covered by some insurers.  Drugs of Abuse   Condition: Possible current or past illicit drug use that results in organ systems damage   Purpose: Screens for the possibility that the consumer takes illegal or restricted drugs of abuse   Format: SA; In commercial employer situations, positive results are then referred to laboratories for confirmation.   Availability: OTC  Comments: Results could be affected by medications or foods, such as poppy seeds.  HIV Antibody   Condition: Forerunner of AIDS; susceptibility to other sexually transmitted diseases (STD)   Purpose: Screens for infection by HIV, the virus that causes AIDS   Format: Kit; sample sent to lab for analysis.   Availability: OTC  Comments: Results available by phone, which protects anonymity. Counseling is required if test is positive and recommended if test is negative.  Hepatitis C   Condition: Liver dysfunction   Purpose: Screens for past or current infection by Hepatitis C.   Format: Kit;  sample sent to lab for analysis. Lab runs same tests used by doctors and hospitals to determine whether antibodies to Hepatitis C are present.   Availability: OTC  Comments: Like HIV, combines telephone  registration/pretest counseling, collection of blood sample, shipping, laboratory testing, telephone results retrieval, and post-test counseling.  Other Home Tests   Condition: Various, such as osteoporosis, hormone levels, STD's   Purpose: Screening   Format: N/A. Varies.   Availability: N/A. Some tests may be available internationally but are not yet approved in the [place]  Comments: The FDA continuously updates its list of tests approved and cleared for marketing. For example, recent approvals include a monitoring test for patients who have been diagnosed for bladder cancer, a screening test for blood in urine, etc. Document Released: 01/02/2005 Document Revised: 08/12/2011 Document Reviewed: 11/30/2005 [facility] Patient Information 2012 [place], [place].Pregnancy If you are planning on getting pregnant, it is a good idea to make a preconception appointment with your care- giver to discuss having a healthy lifestyle before getting pregnant. Such as, diet, weight, exercise, taking prenatal vitamins especially folic acid (it helps prevent brain and spinal cord defects), avoiding alcohol, smoking and illegal drugs, medical problems (diabetes, convulsions), family history of genetic problems, working conditions and immunizations. It is better to have knowledge of these things and do something about them before getting pregnant. In your pregnancy, it is important to follow certain guidelines to have a healthy baby. It is very important to get good prenatal care and follow your caregiver's instructions. Prenatal care includes all the medical care you receive before your baby's birth. This helps to prevent problems during the pregnancy and childbirth. HOME CARE INSTRUCTIONS   Start your prenatal visits by the 12th week of pregnancy or before when possible. They are usually scheduled monthly at first. They are more often in the last 2 months before delivery. It is important that you keep your caregiver's  appointments and follow your caregiver's instructions regarding medication use, exercise, and diet.   During pregnancy, you are providing food for you and your baby. Eat a regular, well-balanced diet. Choose foods such as meat, fish, milk and other dairy products, vegetables, fruits, whole-grain breads and cereals. Your caregiver will inform you of the ideal weight gain depending on your current height and weight. Drink lots of liquids. Try to drink 8 glasses of water a day.   Alcohol is associated with a number of birth defects including fetal alcohol syndrome. It is best to avoid alcohol completely. Smoking will cause low birth rate and prematurity. Use of alcohol and nicotine during your pregnancy also increases the chances that your child will be chemically dependent later in their life and may contribute to SIDS (Sudden Infant Death Syndrome).   Do not use illegal drugs.   Only take prescription or over-the-counter medications that are recommended by your caregiver. Other medications can cause genetic and physical problems in the baby.   Morning sickness can often be helped by keeping soda crackers at the bedside. Eat a couple before arising in the morning.   A sexual relationship may be continued until near the end of pregnancy if there are no other problems such as early (premature) leaking of amniotic fluid from the membranes, vaginal bleeding, painful intercourse or belly (abdominal) pain.   Exercise regularly. Check with your caregiver if you are unsure of the safety of some of your exercises.   Do not use hot tubs, steam rooms or saunas. These increase the risk of fainting or  passing out and hurting yourself and the baby. Swimming is OK for exercise. Get plenty of rest, including afternoon naps when possible especially in the third trimester.   Avoid toxic odors and chemicals.   Do not wear high heels. They may cause you to lose your balance and fall.   Do not lift over 5 pounds. If  you do lift anything, lift with your legs and thighs, not your back.   Avoid long trips, especially in the third trimester.   If you have to travel out of the city or state, take a copy of your medical records with you.  SEEK IMMEDIATE MEDICAL CARE IF:   You develop an unexplained oral temperature above 102 F (38.9 C), or as your caregiver suggests.   You have leaking of fluid from the vagina. If leaking membranes are suspected, take your temperature and inform your caregiver of this when you call.   There is vaginal spotting or bleeding. Notify your caregiver of the amount and how many pads are used.   You continue to feel sick to your stomach (nauseous) and have no relief from remedies suggested, or you throw up (vomit) blood or coffee ground like materials.   You develop upper abdominal pain.   You have round ligament discomfort in the lower abdominal area. This still must be evaluated by your caregiver.   You feel contractions of the uterus.   You do not feel the baby move, or there is less movement than before.   You have painful urination.   You have abnormal vaginal discharge.   You have persistent diarrhea.   You get a severe headache.   You have problems with your vision.   You develop muscle weakness.   You feel dizzy and faint.   You develop shortness of breath.   You develop chest pain.   You have back pain that travels down to your leg and feet.   You feel irregular or a very fast heartbeat.   You develop excessive weight gain in a short period of time (5 pounds in 3 to 5 days).   You are involved with a domestic violence situation.  Document Released: 11/30/2005 Document Revised: 08/12/2011 Document Reviewed: 05/24/2009 [facility] Patient Information 2012 [place], [place].

## 2012-01-28 NOTE — Progress Notes (Signed)
Chief Complaint:  Gyn Exam  Kathryn Suarez is  26 y.o. G0P0.   She presents for gyn exam.  Also reports having problem conceiving and wants evaluation.  Past Medical History: Past Medical History  Diagnosis Date  . Asthma   . Hypertension   . Blood transfusion without reported diagnosis   . Ovarian cyst   . Chronic pelvic pain in female   . Kidney stones   . Schizophrenia   . Bipolar disorder   . Sickle cell trait   . Anemia   . DUB (dysfunctional uterine bleeding)     Past Surgical History: Past Surgical History  Procedure Date  . Dilation and curettage of uterus     Family History: Family History  Problem Relation Age of Onset  . Hypertension Mother     Social History: History  Substance Use Topics  . Smoking status: Never Smoker   . Smokeless tobacco: Never Used  . Alcohol Use: No    Allergies:  Allergies  Allergen Reactions  . Latex Rash    (Not in a hospital admission)  Review of Systems - Negative except HPI  Physical Exam   Blood pressure 139/96, pulse 94, temperature 98.6 F (37 C), temperature source Oral, height 5\' 3"  (1.6 m), weight 234 lb 11.2 oz (106.459 kg), last menstrual period 12/16/2011. BP 139/96  Pulse 94  Temp 98.6 F (37 C) (Oral)  Ht 5\' 3"  (1.6 m)  Wt 234 lb 11.2 oz (106.459 kg)  BMI 41.58 kg/m2  LMP 12/16/2011 GENERAL: Well-developed, well-nourished female in no acute distress.  HEENT: Normocephalic, atraumatic. Sclerae anicteric.  NECK: Supple. Normal thyroid.  LUNGS: Clear to auscultation bilaterally.  HEART: Regular rate and rhythm. BREASTS: Symmetric in size. No masses, skin changes, nipple drainage, or lymphadenopathy. ABDOMEN: Soft, obese, nontender, nondistended. No organomegaly. PELVIC: Normal external female genitalia. Vagina is pink and rugated.  Normal discharge. Normal cervix contour. Pap smear obtained. Uterus is normal in size. No adnexal mass or tenderness.  EXTREMITIES: No cyanosis, clubbing, or edema, 2+  distal pulses.   Assessment: Patient Active Problem List  Diagnosis  . Chronic pelvic pain in female  . Breast discharge  . Infertility associated with anovulation    Plan: Will follow up pap Will do infertility evaluation and follow up results  ANYANWU,UGONNA A 01/18/2013,1:59 PM

## 2012-01-29 LAB — ABO AND RH

## 2012-02-03 ENCOUNTER — Telehealth: Payer: Self-pay | Admitting: *Deleted

## 2012-02-03 NOTE — Telephone Encounter (Signed)
Pt called stating wants Kathryn Suarez to call her about tests needing to be done. No other information given.

## 2012-02-04 NOTE — Telephone Encounter (Signed)
Telephoned home # left message. Telephoned mother's and she stated do not call this # again.

## 2012-02-05 NOTE — Telephone Encounter (Signed)
Returned patients call no answer, left voicemail that if she still has concerns that she can call us back.

## 2012-02-15 ENCOUNTER — Telehealth: Payer: Self-pay | Admitting: *Deleted

## 2012-02-15 NOTE — Telephone Encounter (Addendum)
Spoke with patient she states that Rosalita Chessman told her to start taking PNV before she goes to see the infertility doctor. She would like a prescription sent to her pharmacy.

## 2012-02-15 NOTE — Telephone Encounter (Signed)
Pt left message stating that she needs to speak to a nurse about a medicine that was called in for her.

## 2012-02-19 ENCOUNTER — Telehealth: Payer: Self-pay | Admitting: *Deleted

## 2012-02-19 MED ORDER — PRENATAL RX 60-1 MG PO TABS: 1.0000 | ORAL_TABLET | Freq: Every day | ORAL | Status: DC

## 2012-02-19 NOTE — Telephone Encounter (Signed)
Patient called and left a message she is calling re: a prescription she needs sent to pharmacy and test results

## 2012-02-19 NOTE — Telephone Encounter (Signed)
Returned pt call and informed of normal test results. Pt also asked for Rx for prenatal vitamins so that Medicaid would cover. I told pt that I will send Rx today.  Pt voiced understanding.

## 2012-07-07 ENCOUNTER — Telehealth: Payer: Self-pay | Admitting: *Deleted

## 2012-07-07 NOTE — Telephone Encounter (Signed)
Called Kathryn Suarez, states she has seen the infertility doctor and they have had her on some medicines and her period is now sometimes 2 times a month and wants to know if the medicines did that- Instructed her to call back that doctor as we do not have her records from there or medication lists and since they have prescribed those meds they can tell her if those medicines are affecting her period.  Instructed her to call us back if they are unable to assist her. Also patient states she has an appointment here with Rosalita Chessman soon, but needs to change it- transferred her to front office to change appointment .

## 2012-07-07 NOTE — Telephone Encounter (Signed)
Kathryn Suarez called and left a message stating she would like a nurse to call her, that she has questions about her period.

## 2012-07-14 ENCOUNTER — Ambulatory Visit: Payer: Medicaid Other | Admitting: Physician Assistant

## 2012-08-01 ENCOUNTER — Ambulatory Visit (INDEPENDENT_AMBULATORY_CARE_PROVIDER_SITE_OTHER): Payer: Medicaid Other | Admitting: Obstetrics & Gynecology

## 2012-08-01 ENCOUNTER — Encounter: Payer: Self-pay | Admitting: Obstetrics & Gynecology

## 2012-08-01 VITALS — BP 131/91 | HR 95 | Temp 98.6°F | Ht 63.5 in | Wt 228.5 lb

## 2012-08-01 DIAGNOSIS — N970 Female infertility associated with anovulation: Secondary | ICD-10-CM | POA: Insufficient documentation

## 2012-08-01 MED ORDER — PRENATAL VITAMINS 0.8 MG PO TABS: 1.0000 | ORAL_TABLET | Freq: Every day | ORAL | Status: DC

## 2012-08-01 NOTE — Progress Notes (Signed)
Patient needs refill on PNV 

## 2012-08-01 NOTE — Progress Notes (Signed)
  Subjective:    Patient ID: Kathryn Suarez, female    DOB: 07-08-1986, 26 y.o.   MRN: [id number]  [id number] Patient's last menstrual period was 07/10/2012. Patient is seen by Dr. Elesa Hacker in [place]. She has been receiving Femara and hCG for ovulation induction. Last period was middle June and she had insemination at the end of June. She has noticed a nipple discharge from both sides and she had some slight blood in the discharge on the left. She also has some breast soreness. She's had similar symptoms in the past and a normal prolactin level in the past. She scheduled to return in one month. Past Medical History  Diagnosis Date  . Chronic kidney disease     kidney stones  . Mental disorder   . Asthma   . Hypertension   . Blood transfusion without reported diagnosis   . Sickle cell anemia   . Sickle cell anemia   . Ovarian cyst    Past Surgical History  Procedure Date  . Dilation and curettage of uterus    Allergies  Allergen Reactions  . Latex Rash   Current Outpatient Prescriptions on File Prior to Visit  Medication Sig Dispense Refill  . albuterol (PROVENTIL HFA;VENTOLIN HFA) 108 (90 BASE) MCG/ACT inhaler Inhale 2 puffs into the lungs every 6 (six) hours as needed. Shortness of breath      . Multiple Vitamin (MULTIVITAMIN) tablet Take 1 tablet by mouth daily.        Marland Kitchen oxyCODONE-acetaminophen (PERCOCET) 5-325 MG per tablet Take 1 tablet by mouth every 4 (four) hours as needed. pain      . ibuprofen (ADVIL,MOTRIN) 800 MG tablet Take 800 mg by mouth every 8 (eight) hours as needed. For pain          Review of Systems Irregular menses. Weight gain. A nipple discharge. No vaginal discharge.    Objective:   Physical Exam Blood pressure 131/91, pulse 95, temperature 98.6 F (37 C), temperature source Oral, height 5' 3.5" (1.613 m), weight 228 lb 8 oz (103.647 kg), last menstrual period 07/10/2012. No acute distress normal affect  Breasts are symmetric was minimal tenderness  no mass and no nipple discharge was noted.  Negative pregnancy test       Assessment & Plan:  Nipple discharge which appears to be benign but should be followed up. Did not order a repeat prolactin level today. She scheduled to see Maylon Cos next month. She also will followup with Dr. Elesa Hacker.  ARNOLD,JAMES 08/01/2012 3:39 PM

## 2012-08-01 NOTE — Patient Instructions (Signed)
Infertility WHAT IS INFERTILITY?  Infertility is usually defined as not being able to get pregnant after trying for one year of regular sexual intercourse without the use of contraceptives. Or not being able to carry a pregnancy to term and have a baby. The infertility rate in the [place] is around 10%. Pregnancy is the result of a chain of events. A woman must release an egg from one of her ovaries (ovulation). The egg must be fertilized by the female sperm. Then it travels through a fallopian tube into the uterus (womb), where it attaches to the wall of the uterus and grows. A man must have enough sperm, and the sperm must join with (fertilize) the egg along the way, at the proper time. The fertilized egg must then become attached to the inside of the uterus. While this may seem simple, many things can happen to prevent pregnancy from occurring.  WHOSE PROBLEM IS IT?  About 20% of infertility cases are due to problems with the man (female factors) and 65% are due to problems with the woman (female factors). Other cases are due to a combination of female and female factors or to unknown causes.  WHAT CAUSES INFERTILITY IN MEN?  Infertility in men is often caused by problems with making enough normal sperm or getting the sperm to reach the egg. Problems with sperm may exist from birth or develop later in life, due to illness or injury. Some men produce no sperm, or produce too few sperm (oligospermia). Other problems include:  Sexual dysfunction.   Hormonal or endocrine problems.   Age. Female fertility decreases with age, but not at as young an age as female fertility.   Infection.   Congenital problems. Birth defect, such as absence of the tubes that carry the sperm (vas deferens).   Genetic/chromosomal problems.   Antisperm antibody problems.   Retrograde ejaculation (sperm go into the bladder).   Varicoceles, spematoceles, or tumors of the testicles.   Lifestyle can influence the number  and quality of a man's sperm.   Alcohol and drugs can temporarily reduce sperm quality.   Environmental toxins, including pesticides and lead, may cause some cases of infertility in men.  WHAT CAUSES INFERTILITY IN WOMEN?   Problems with ovulation account for most infertility in women. Without ovulation, eggs are not available to be fertilized.   Signs of problems with ovulation include irregular menstrual periods or no periods at all.   Simple lifestyle factors, including stress, diet, or athletic training, can affect a woman's hormonal balance.   Age. Fertility begins to decrease in women in the early 30s and is worse after age 37.   Much less often, a hormonal imbalance from a serious medical problem, such as a pituitary gland tumor, thyroid or other chronic medical disease, can cause ovulation problems.   Pelvic infections.   Polycystic ovary syndrome (increase in female hormones, unable to ovulate).   Alcohol or illegal drugs.   Environmental toxins, radiation, pesticides, and certain chemicals.   Aging is an important factor in female infertility.   The ability of a woman's ovaries to produce eggs declines with age, especially after age 35. About one third of couples where the woman is over 35 will have problems with fertility.   By the time she reaches menopause when her monthly periods stop for good, a woman can no longer produce eggs or become pregnant.   Other problems can also lead to infertility in women. If the fallopian tubes are blocked   at one or both ends, the egg cannot travel through the tubes into the uterus. Scar tissue (adhesions) in the pelvis may cause blocked tubes. This may result from pelvic inflammatory disease, endometriosis, or surgery for an ectopic pregnancy (fertilized egg implanted outside the uterus) or any pelvic or abdominal surgery causing adhesions.   Fibroid tumors or polyps of the uterus.   Congenital (birth defect) abnormalities of the uterus.    Infection of the cervix (cervicitis).   Cervical stenosis (narrowing).   Abnormal cervical mucus.   Polycystic ovary syndrome.   Having sexual intercourse too often (every other day or 4 to 5 times a week).   Obesity.   Anorexia.   Poor nutrition.   Over exercising, with loss of body fat.   DES. Your mother received diethylstilbesterol hormone when pregnant with you.  HOW IS INFERTILITY TESTED?  If you have been trying to have a baby without success, you may want to seek medical help. You should not wait for one year of trying before seeing a health care provider if:  You are over 35.   You have reason to believe that there may be a fertility problem.  A medical evaluation may determine the reasons for a couple's infertility. Usually this process begins with:  Physical exams.   Medical histories of both partners.   Sexual histories of both partners.  If there is no obvious problem, like improperly timed intercourse or absence of ovulation, tests may be needed.   For a man, testing usually begins with tests of his semen to look at:   The number of sperm.   The shape of sperm.   Movement of his sperm.   Taking a complete medical and surgical history.   Physical examination.   Check for infection of the female reproductive organs.  Sometimes hormone tests are done.   For a woman, the first step in testing is to find out if she is ovulating each month. There are several ways to do this. For example, she can keep track of changes in her morning body temperature and in the texture of her cervical mucus. Another tool is a home ovulation test kit, which can be bought at drug or grocery stores.   Checks of ovulation can also be done in the doctor's office, using blood tests for hormone levels or ultrasound tests of the ovaries. If the woman is ovulating, more tests will need to be done. Some common female tests include:   Hysterosalpingogram: An x-ray of the fallopian  tubes and uterus after they are injected with dye. It shows if the tubes are open and shows the shape of the uterus.   Laparoscopy: An exam of the tubes and other female organs for disease. A lighted tube called a laparoscope is used to see inside the abdomen.   Endometrial biopsy: Sample of uterus tissue taken on the first day of the menstrual period, to see if the tissue indicates you are ovulating.   Transvaginal ultrasound: Examines the female organs.   Hysteroscopy: Uses a lighted tube to examine the cervix and inside the uterus, to see if there are any abnormalities inside the uterus.  TREATMENT  Depending on the test results, different treatments can be suggested. The type of treatment depends on the cause. 85 to 90% of infertility cases are treated with drugs or surgery.   Various fertility drugs may be used for women with ovulation problems. It is important to talk with your caregiver about the drug to   be used. You should understand the drug's benefits and side effects. Depending on the type of fertility drug and the dosage of the drug used, multiple births (twins or multiples) can occur in some women.   If needed, surgery can be done to repair damage to a woman's ovaries, fallopian tubes, cervix, or uterus.   Surgery or medical treatment for endometriosis or polycystic ovary syndrome. Sometimes a man has an infertility problem that can be corrected with medicine or by surgery.   Intrauterine insemination (IUI) of sperm, timed with ovulation.   Change in lifestyle, if that is the cause (lose weight, increase exercise, and stop smoking, drinking excessively, or taking illegal drugs).   Other types of surgery:   Removing growths inside and on the uterus.   Removing scar tissue from inside of the uterus.   Fixing blocked tubes.   Removing scar tissue in the pelvis and around the female organs.  WHAT IS ASSISTED REPRODUCTIVE TECHNOLOGY (ART)?  Assisted reproductive technology  (ART) is another form of special methods used to help infertile couples. ART involves handling both the woman's eggs and the man's sperm. Success rates vary and depend on many factors. ART can be expensive and time-consuming. But ART has made it possible for many couples to have children that otherwise would not have been conceived. Some methods are listed below:  In vitro fertilization (IVF). IVF is often used when a woman's fallopian tubes are blocked or when a man has low sperm counts. A drug is used to stimulate the ovaries to produce multiple eggs. Once mature, the eggs are removed and placed in a culture dish with the man's sperm for fertilization. After about 40 hours, the eggs are examined to see if they have become fertilized by the sperm and are dividing into cells. These fertilized eggs (embryos) are then placed in the woman's uterus. This bypasses the fallopian tubes.   Gamete intrafallopian transfer (GIFT) is similar to IVF, but used when the woman has at least one normal fallopian tube. Three to five eggs are placed in the fallopian tube, along with the man's sperm, for fertilization inside the woman's body.   Zygote intrafallopian transfer (ZIFT), also called tubal embryo transfer, combines IVF and GIFT. The eggs retrieved from the woman's ovaries are fertilized in the lab and placed in the fallopian tubes rather than in the uterus.   ART procedures sometimes involve the use of donor eggs (eggs from another woman) or previously frozen embryos. Donor eggs may be used if a woman has impaired ovaries or has a genetic disease that could be passed on to her baby.   When performing ART, you are at higher risk for resulting in multiple pregnancies, twins, triplets or more.   Intracytoplasma sperm injection is a procedure that injects a single sperm into the egg to fertilize it.   Embryo transplant is a procedure that starts after growing an embryo in a special media (chemical solution)  developed to keep the embryo alive for 2 to 5 days, and then transplanting it into the uterus.  In cases where a cause cannot be found and pregnancy does not occur, adoption may be a consideration. Document Released: 12/03/2003 Document Revised: 11/19/2011 Document Reviewed: 10/29/2009 [employer] Patient Information 2012 [employer]. 

## 2012-08-02 ENCOUNTER — Telehealth: Payer: Self-pay | Admitting: *Deleted

## 2012-08-02 DIAGNOSIS — N970 Female infertility associated with anovulation: Secondary | ICD-10-CM

## 2012-08-02 MED ORDER — PRENATAL VIT-FE FUMARATE-FA 60-1 MG PO TABS: 1.0000 | ORAL_TABLET | Freq: Every day | ORAL | Status: DC

## 2012-08-02 NOTE — Telephone Encounter (Signed)
Pt left message stating that she had a question about medication prescribed yesterday. I returned pt's call and she stated that the PNV's prescribed yesterday are not covered by her Medicaid. She would like the same Rx that she had previously been given. I reviewed pt's medication history and told her that I would send the alternate Rx through. She should call us back tomorrow if still having problems. Pt voiced understanding.

## 2012-08-29 ENCOUNTER — Ambulatory Visit (INDEPENDENT_AMBULATORY_CARE_PROVIDER_SITE_OTHER): Payer: Medicaid Other | Admitting: Physician Assistant

## 2012-08-29 ENCOUNTER — Encounter: Payer: Self-pay | Admitting: Physician Assistant

## 2012-08-29 VITALS — BP 121/80 | HR 88 | Temp 99.0°F | Ht 63.5 in | Wt 230.2 lb

## 2012-08-29 DIAGNOSIS — N6452 Nipple discharge: Secondary | ICD-10-CM

## 2012-08-29 DIAGNOSIS — N6459 Other signs and symptoms in breast: Secondary | ICD-10-CM

## 2012-08-29 NOTE — Progress Notes (Signed)
Chief Complaint:  Follow-up   Kathryn Suarez is  26 y.o. G0P0.  Patient's last menstrual period was 07/10/2012..    She presents complaining of Follow-up  Presents for follow-up of bloody nipple. Reports continued and increased bloody nipple discharge. Continues to be seen by Dr. Elesa Hacker for infertility. No ovulation. Continues Famera, provera and ovulation kits.  Obstetrical/Gynecological History: OB History    Grav Para Term Preterm Abortions TAB SAB Ect Mult Living   0               Past Medical History: Past Medical History  Diagnosis Date  . Chronic kidney disease     kidney stones  . Mental disorder   . Asthma   . Hypertension   . Blood transfusion without reported diagnosis   . Sickle cell anemia   . Sickle cell anemia   . Ovarian cyst     Past Surgical History: Past Surgical History  Procedure Date  . Dilation and curettage of uterus     Family History: Family History  Problem Relation Age of Onset  . Hypertension Mother     Social History: History  Substance Use Topics  . Smoking status: Never Smoker   . Smokeless tobacco: Never Used  . Alcohol Use: No    Allergies:  Allergies  Allergen Reactions  . Latex Rash     Review of Systems - Negative except what has been reviewed above  Physical Exam   Blood pressure 121/80, pulse 88, temperature 99 F (37.2 C), temperature source Oral, height 5' 3.5" (1.613 m), weight 230 lb 3.2 oz (104.418 kg), last menstrual period 07/10/2012.  General: General appearance - alert, well appearing, and in no distress, oriented to person, place, and time and overweight Mental status - alert, oriented to person, place, and time, normal mood, behavior, speech, dress, motor activity, and thought processes, affect appropriate to mood Breasts - breasts appear normal, no suspicious masses, no skin or nipple changes or axillary nodes, left breast tenderness. Unable to express discharge from nipples Focused Gynecological  Exam: examination not indicated   Assessment: 1. Bloody discharge from nipple     [facility] Continue diet/exercise for weight-loss    Kathryn Suarez E. 08/29/2012,1:52 PM

## 2012-09-09 ENCOUNTER — Telehealth: Payer: Self-pay | Admitting: *Deleted

## 2012-09-09 NOTE — Telephone Encounter (Signed)
Returned pt's call and discussed her concern. She stated that she would like to have Megace called to her pharmacy. She previously had this medication prescribed by a doctor @ [address] and had refills available from her former pharmacy - [facility]. That pharmacy is no longer in business per pt and she is now having a heavy period. She reports the period started last week and she is changing a pad every 1-2 hours at times. She also states that her periods normally last only 3-4 days. I advised pt that since our practice has not previously prescribed the medication and she has not been evaluated for this problem in our office, we will not be able to prescribe the medication @ this time. She will need appt @ clinic or she may go to [facility] if she is saturating a pad every hour for 3 consecutive hours.  Pt voiced understanding and stated she would like to schedule clinic appt. I transferred her call to [facility] for appt.

## 2012-09-09 NOTE — Telephone Encounter (Signed)
Pt left message stating that she has questions for the nurse. Please call back.

## 2012-10-10 ENCOUNTER — Ambulatory Visit: Payer: Medicaid Other | Admitting: Obstetrics & Gynecology

## 2012-10-20 ENCOUNTER — Ambulatory Visit: Payer: Medicaid Other | Admitting: Obstetrics & Gynecology

## 2012-11-29 ENCOUNTER — Encounter (HOSPITAL_COMMUNITY): Payer: Self-pay | Admitting: Physical Medicine and Rehabilitation

## 2012-11-29 ENCOUNTER — Emergency Department (HOSPITAL_COMMUNITY)
Admission: EM | Admit: 2012-11-29 | Discharge: 2012-11-29 | Disposition: A | Discharge status: Home / Self Care | Payer: Medicaid Other | Attending: Emergency Medicine | Admitting: Emergency Medicine

## 2012-11-29 ENCOUNTER — Emergency Department (HOSPITAL_COMMUNITY): Payer: Medicaid Other

## 2012-11-29 DIAGNOSIS — N949 Unspecified condition associated with female genital organs and menstrual cycle: Secondary | ICD-10-CM | POA: Insufficient documentation

## 2012-11-29 DIAGNOSIS — G8929 Other chronic pain: Secondary | ICD-10-CM | POA: Insufficient documentation

## 2012-11-29 DIAGNOSIS — R109 Unspecified abdominal pain: Secondary | ICD-10-CM | POA: Insufficient documentation

## 2012-11-29 DIAGNOSIS — I10 Essential (primary) hypertension: Secondary | ICD-10-CM | POA: Insufficient documentation

## 2012-11-29 DIAGNOSIS — Z3202 Encounter for pregnancy test, result negative: Secondary | ICD-10-CM | POA: Insufficient documentation

## 2012-11-29 DIAGNOSIS — Z862 Personal history of diseases of the blood and blood-forming organs and certain disorders involving the immune mechanism: Secondary | ICD-10-CM | POA: Insufficient documentation

## 2012-11-29 DIAGNOSIS — Z8659 Personal history of other mental and behavioral disorders: Secondary | ICD-10-CM | POA: Insufficient documentation

## 2012-11-29 DIAGNOSIS — R197 Diarrhea, unspecified: Secondary | ICD-10-CM | POA: Insufficient documentation

## 2012-11-29 DIAGNOSIS — Z79899 Other long term (current) drug therapy: Secondary | ICD-10-CM | POA: Insufficient documentation

## 2012-11-29 DIAGNOSIS — Z8742 Personal history of other diseases of the female genital tract: Secondary | ICD-10-CM | POA: Insufficient documentation

## 2012-11-29 DIAGNOSIS — D573 Sickle-cell trait: Secondary | ICD-10-CM | POA: Insufficient documentation

## 2012-11-29 DIAGNOSIS — R112 Nausea with vomiting, unspecified: Secondary | ICD-10-CM | POA: Insufficient documentation

## 2012-11-29 DIAGNOSIS — Z87442 Personal history of urinary calculi: Secondary | ICD-10-CM | POA: Insufficient documentation

## 2012-11-29 DIAGNOSIS — J40 Bronchitis, not specified as acute or chronic: Secondary | ICD-10-CM | POA: Insufficient documentation

## 2012-11-29 DIAGNOSIS — J45909 Unspecified asthma, uncomplicated: Secondary | ICD-10-CM | POA: Insufficient documentation

## 2012-11-29 HISTORY — DX: Pelvic and perineal pain: R10.2

## 2012-11-29 HISTORY — DX: Anemia, unspecified: D64.9

## 2012-11-29 HISTORY — DX: Calculus of kidney: N20.0

## 2012-11-29 HISTORY — DX: Sickle-cell trait: D57.3

## 2012-11-29 HISTORY — DX: Other chronic pain: G89.29

## 2012-11-29 LAB — CBC WITH DIFFERENTIAL/PLATELET
Basophils Absolute: 0 10*3/uL (ref 0.0–0.1)
Basophils Relative: 0 % (ref 0–1)
Eosinophils Absolute: 0.2 10*3/uL (ref 0.0–0.7)
Eosinophils Relative: 2 % (ref 0–5)
Lymphocytes Relative: 29 % (ref 12–46)
MCH: 26.9 pg (ref 26.0–34.0)
MCV: 78.9 fL (ref 78.0–100.0)
Platelets: 235 10*3/uL (ref 150–400)
RDW: 12.6 % (ref 11.5–15.5)
WBC: 7.9 10*3/uL (ref 4.0–10.5)

## 2012-11-29 LAB — URINALYSIS, ROUTINE W REFLEX MICROSCOPIC
Bilirubin Urine: NEGATIVE
Glucose, UA: NEGATIVE mg/dL
Hgb urine dipstick: NEGATIVE
Ketones, ur: NEGATIVE mg/dL
Leukocytes, UA: NEGATIVE
pH: 6 (ref 5.0–8.0)

## 2012-11-29 LAB — COMPREHENSIVE METABOLIC PANEL
Albumin: 3.8 g/dL (ref 3.5–5.2)
Alkaline Phosphatase: 90 U/L (ref 39–117)
BUN: 8 mg/dL (ref 6–23)
Chloride: 100 mEq/L (ref 96–112)
Potassium: 3.8 mEq/L (ref 3.5–5.1)
Total Bilirubin: 0.3 mg/dL (ref 0.3–1.2)

## 2012-11-29 LAB — RETICULOCYTES
Retic Count, Absolute: 60.5 10*3/uL (ref 19.0–186.0)
Retic Ct Pct: 1.3 % (ref 0.4–3.1)

## 2012-11-29 LAB — LIPASE, BLOOD: Lipase: 33 U/L (ref 11–59)

## 2012-11-29 LAB — TROPONIN I: Troponin I: <0.3 ng/mL (ref <0.30)

## 2012-11-29 LAB — D-DIMER, QUANTITATIVE: D-Dimer, Quant: 0.86 ug/mL-FEU — ABNORMAL HIGH (ref 0.00–0.48)

## 2012-11-29 MED ORDER — FAMOTIDINE IN NACL 20-0.9 MG/50ML-% IV SOLN
20.0000 mg | Freq: Once | INTRAVENOUS | Status: AC
  Administered 2012-11-29: 20 mg via INTRAVENOUS
  Filled 2012-11-29: qty 50

## 2012-11-29 MED ORDER — MORPHINE SULFATE 4 MG/ML IJ SOLN: 4.0000 mg | INTRAMUSCULAR | Status: DC | PRN

## 2012-11-29 MED ORDER — PROMETHAZINE HCL 25 MG RE SUPP: 25.0000 mg | Freq: Four times a day (QID) | RECTAL | Status: DC | PRN

## 2012-11-29 MED ORDER — IPRATROPIUM BROMIDE 0.02 % IN SOLN
0.5000 mg | Freq: Once | RESPIRATORY_TRACT | Status: AC
  Administered 2012-11-29: 0.5 mg via RESPIRATORY_TRACT
  Filled 2012-11-29: qty 2.5

## 2012-11-29 MED ORDER — ONDANSETRON HCL 4 MG/2ML IJ SOLN
4.0000 mg | INTRAMUSCULAR | Status: DC | PRN
  Administered 2012-11-29: 4 mg via INTRAVENOUS
  Filled 2012-11-29: qty 2

## 2012-11-29 MED ORDER — IOHEXOL 350 MG/ML SOLN
100.0000 mL | Freq: Once | INTRAVENOUS | Status: AC | PRN
  Administered 2012-11-29: 100 mL via INTRAVENOUS

## 2012-11-29 MED ORDER — PROMETHAZINE HCL 25 MG PO TABS: 25.0000 mg | ORAL_TABLET | Freq: Four times a day (QID) | ORAL | Status: DC | PRN

## 2012-11-29 MED ORDER — SODIUM CHLORIDE 0.9 % IV SOLN
INTRAVENOUS | Status: DC
  Administered 2012-11-29: 13:00:00 via INTRAVENOUS

## 2012-11-29 MED ORDER — SODIUM CHLORIDE 0.9 % IV BOLUS (SEPSIS)
1000.0000 mL | Freq: Once | INTRAVENOUS | Status: AC
  Administered 2012-11-29: 1000 mL via INTRAVENOUS

## 2012-11-29 MED ORDER — SODIUM CHLORIDE 0.9 % IV BOLUS (SEPSIS): 1000.0000 mL | Freq: Once | INTRAVENOUS | Status: DC

## 2012-11-29 MED ORDER — ALBUTEROL SULFATE (5 MG/ML) 0.5% IN NEBU
5.0000 mg | INHALATION_SOLUTION | Freq: Once | RESPIRATORY_TRACT | Status: AC
  Administered 2012-11-29: 5 mg via RESPIRATORY_TRACT
  Filled 2012-11-29: qty 1

## 2012-11-29 NOTE — ED Notes (Signed)
Pt ambulated in hall, c/o worsening dizziness, nausea and weakness worse if up too long. Pt also stated that she has been sleeping downstairs in her house because she is unable to go up the step d/t weakness and dizziness.

## 2012-11-29 NOTE — ED Provider Notes (Signed)
History     CSN: [id number]  Arrival date & time 11/29/12  1046   First MD Initiated Contact with Patient 11/29/12 1137      Chief Complaint  Patient presents with  . Abdominal Pain  . Emesis     HPI Pt was seen at 1155.   Per pt, c/o gradual onset and persistence of constant upper abd pain for the past 2 days.  Has been associated with vague constant chest "pain," multiple intermittent episodes of N/V, generalized body aches and fatigue, cough, sinus congestion and runny/stuffy nose for the past 2 days.  States home temps have been to "100."  Denies diarrhea, no dysuria, no back pain, no fevers, no palpitations, no SOB, no rash, no black or blood in stools or emesis.       Past Medical History  Diagnosis Date  . Asthma   . Hypertension   . Blood transfusion without reported diagnosis   . Ovarian cyst   . Chronic pelvic pain in female   . Kidney stones   . Schizophrenia   . Bipolar disorder   . Sickle cell trait   . Anemia   . DUB (dysfunctional uterine bleeding)     Past Surgical History  Procedure Date  . Dilation and curettage of uterus     Family History  Problem Relation Age of Onset  . Hypertension Mother     History  Substance Use Topics  . Smoking status: Never Smoker   . Smokeless tobacco: Never Used  . Alcohol Use: No    OB History    Grav Para Term Preterm Abortions TAB SAB Ect Mult Living   0               Review of Systems ROS: Statement: All systems negative except as marked or noted in the HPI; Constitutional: Negative for fever and chills. +generalized body aches and fatigue. ; ; Eyes: Negative for eye pain, redness and discharge. ; ; ENMT: Negative for ear pain, hoarseness, sore throat. +nasal congestion, sinus pressure. ; ; Cardiovascular: +CP. Negative for palpitations, diaphoresis, dyspnea and peripheral edema. ; ; Respiratory: +cough. Negative for wheezing and stridor. ; ; Gastrointestinal: +N/V, abd pain. Negative for diarrhea, blood  in stool, hematemesis, jaundice and rectal bleeding. . ; ; Genitourinary: Negative for dysuria, flank pain and hematuria. ; ; Musculoskeletal: Negative for back pain and neck pain. Negative for swelling and trauma.; ; Skin: Negative for pruritus, rash, abrasions, blisters, bruising and skin lesion.; ; Neuro: Negative for headache, lightheadedness and neck stiffness. Negative for weakness, altered level of consciousness , altered mental status, extremity weakness, paresthesias, involuntary movement, seizure and syncope.     Allergies  Latex  Home Medications   Current Outpatient Rx  Name  Route  Sig  Dispense  Refill  . ALBUTEROL SULFATE HFA 108 (90 BASE) MCG/ACT IN AERS   Inhalation   Inhale 2 puffs into the lungs every 6 (six) hours as needed. Shortness of breath         . ONE-DAILY MULTI VITAMINS PO TABS   Oral   Take 1 tablet by mouth daily.          . OXYCODONE-ACETAMINOPHEN 5-325 MG PO TABS   Oral   Take 1 tablet by mouth every 6 (six) hours as needed. pain           BP 102/56  Pulse 93  Temp 98.9 F (37.2 C) (Oral)  Resp 15  Ht 5\' 3"  (1.6 m)  Wt 230 lb (104.327 kg)  BMI 40.74 kg/m2  SpO2 99%  Physical Exam 1200: Physical examination:  Nursing notes reviewed; Vital signs and O2 SAT reviewed;  Constitutional: Well developed, Well nourished, Well hydrated, In no acute distress; Head:  Normocephalic, atraumatic; Eyes: EOMI, PERRL, No scleral icterus; ENMT: TM's clear bilat. +edemetous nasal turbinates bilat with clear rhinorrhea.  Mouth and pharynx without lesions. No tonsillar exudates. No intra-oral edema. No hoarse voice, no drooling, no stridor. Mouth and pharynx normal, Mucous membranes moist; Neck: Supple, Full range of motion, No lymphadenopathy; Cardiovascular: Regular rate and rhythm, No murmur, rub, or gallop; Respiratory: Breath sounds clear & equal bilaterally, No rales, rhonchi, wheezes.  Speaking full sentences with ease, Normal respiratory effort/excursion;  Chest: Nontender, Movement normal; Abdomen: Soft, +mild mid-epigastric area tenderness to palp. No rebound or guarding. Nondistended, Normal bowel sounds; Genitourinary: No CVA tenderness; Extremities: Pulses normal, No tenderness, No edema, No calf edema or asymmetry.; Neuro: AA&Ox3, Major CN grossly intact.  Speech clear. No gross focal motor or sensory deficits in extremities.; Skin: Color normal, Warm, Dry.   ED Course  Procedures   1300:  Pt stated to Triage RN, ED RN and myself that she had a hx of "sickle cell."  Upon EPIC chart review, pt has hx of SS TRAIT and anemia which was attributed to dysfunctional uterine bleeding.  Pt confronted with this information.  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  Work up already in progress.    MDM  MDM Reviewed: previous chart, nursing note and vitals Interpretation: labs, ECG and x-ray      Date: 11/29/2012  Rate: 68  Rhythm: normal sinus rhythm  QRS Axis: normal  Intervals: normal  ST/T Wave abnormalities: nonspecific ST/T changes  Conduction Disutrbances:none  Narrative Interpretation:   Old EKG Reviewed: none available.  Results for orders placed during the hospital encounter of 11/29/12  URINALYSIS, ROUTINE W REFLEX MICROSCOPIC      Component Value Range   Color, Urine YELLOW  YELLOW   APPearance CLEAR  CLEAR   Specific Gravity, Urine 1.017  1.005 - 1.030   pH 6.0  5.0 - 8.0   Glucose, UA NEGATIVE  NEGATIVE mg/dL   Hgb urine dipstick NEGATIVE  NEGATIVE   Bilirubin Urine NEGATIVE  NEGATIVE   Ketones, ur NEGATIVE  NEGATIVE mg/dL   Protein, ur NEGATIVE  NEGATIVE mg/dL   Urobilinogen, UA 0.2  0.0 - 1.0 mg/dL   Nitrite NEGATIVE  NEGATIVE   Leukocytes, UA NEGATIVE  NEGATIVE  POCT PREGNANCY, URINE      Component Value Range   Preg Test, Ur NEGATIVE  NEGATIVE  COMPREHENSIVE METABOLIC PANEL      Component Value Range   Sodium 138  135 - 145 mEq/L   Potassium 3.8  3.5 - 5.1 mEq/L   Chloride 100  96 - 112 mEq/L    CO2 27  19 - 32 mEq/L   Glucose, Bld 88  70 - 99 mg/dL   BUN 8  6 - 23 mg/dL   Creatinine, Ser 1.61  0.50 - 1.10 mg/dL   Calcium 9.5  8.4 - 09.6 mg/dL   Total Protein 7.5  6.0 - 8.3 g/dL   Albumin 3.8  3.5 - 5.2 g/dL   AST 14  0 - 37 U/L   ALT 9  0 - 35 U/L   Alkaline Phosphatase 90  39 - 117 U/L   Total Bilirubin 0.3  0.3 - 1.2 mg/dL  GFR calc non Af Amer 88 (*) >90 mL/min   GFR calc Af Amer >90  >90 mL/min  CBC WITH DIFFERENTIAL      Component Value Range   WBC 7.9  4.0 - 10.5 K/uL   RBC 4.65  3.87 - 5.11 MIL/uL   Hemoglobin 12.5  12.0 - 15.0 g/dL   HCT 16.1  09.6 - 04.5 %   MCV 78.9  78.0 - 100.0 fL   MCH 26.9  26.0 - 34.0 pg   MCHC 34.1  30.0 - 36.0 g/dL   RDW 40.9  81.1 - 91.4 %   Platelets 235  150 - 400 K/uL   Neutrophils Relative 63  43 - 77 %   Neutro Abs 5.0  1.7 - 7.7 K/uL   Lymphocytes Relative 29  12 - 46 %   Lymphs Abs 2.3  0.7 - 4.0 K/uL   Monocytes Relative 6  3 - 12 %   Monocytes Absolute 0.4  0.1 - 1.0 K/uL   Eosinophils Relative 2  0 - 5 %   Eosinophils Absolute 0.2  0.0 - 0.7 K/uL   Basophils Relative 0  0 - 1 %   Basophils Absolute 0.0  0.0 - 0.1 K/uL  LIPASE, BLOOD      Component Value Range   Lipase 33  11 - 59 U/L  TROPONIN I      Component Value Range   Troponin I <0.30  <0.30 ng/mL  RETICULOCYTES      Component Value Range   Retic Ct Pct 1.3  0.4 - 3.1 %   RBC. 4.65  3.87 - 5.11 MIL/uL   Retic Count, Manual 60.5  19.0 - 186.0 K/uL  D-DIMER, QUANTITATIVE      Component Value Range   D-Dimer, Quant 0.86 (*) 0.00 - 0.48 ug/mL-FEU   Dg Chest 2 View 11/29/2012  *RADIOLOGY REPORT*  Clinical Data: Chest pain and shortness of breath  CHEST - 2 VIEW  Comparison: August 32,011  Findings: Lungs clear.  Heart size and pulmonary vascularity are normal.  No adenopathy.  No pneumothorax.  No bone lesions.  IMPRESSION: No abnormality noted.   Original Report Authenticated By: Bretta Bang, M.D.     Results for GOLDA, ZAVALZA (MRN [id number]) as  of 11/29/2012 18:48  Ref. Range 07/12/2010 12:27 10/06/2010 12:45 11/29/2012 12:36  RBC. Latest Range: 3.87-5.11 MIL/uL 4.71 4.50 4.65  Retic Ct Pct Latest Range: 0.4-3.1 % 1.2 1.3 1.3  Retic Count, Manual Latest Range: 19.0-186.0 K/uL 56.5 58.5 60.5    1615:  Pt has tol PO well while in the ED without N/V.  No stooling while in the ED.  Not orthostatic on VS.  Remains afebrile.  IVF given for mild hypotension.  H/H stable.  RBC/retic count consistent with her previous.  CT-A chest pending.  Dispo based on results.  Appears viral illness at this time. Sign out to Dr. Lynelle Doctor.           Laray Anger, DO 11/29/12 1851

## 2012-11-29 NOTE — ED Notes (Signed)
Pt presents to department for evaluation of abdominal pain, nausea and vomiting. Ongoing x1 day. Denies urinary symptoms. Denies vaginal discharge. Also states she feels very dizzy and has pain to both legs. She is conscious alert and oriented x4. History of sickle cell. 8/10 pain at the time.

## 2012-11-29 NOTE — ED Provider Notes (Signed)
Dg Chest 2 View  11/29/2012  *RADIOLOGY REPORT*  Clinical Data: Chest pain and shortness of breath  CHEST - 2 VIEW  Comparison: August 32,011  Findings: Lungs clear.  Heart size and pulmonary vascularity are normal.  No adenopathy.  No pneumothorax.  No bone lesions.  IMPRESSION: No abnormality noted.   Original Report Authenticated By: Bretta Bang, M.D.    Ct Angio Chest Pe W/cm &/or Wo Cm  11/29/2012  *RADIOLOGY REPORT*  Clinical Data: Abdominal pain, nausea and vomiting.  Dizziness. Elevated D-dimer.  Sickle cell disease.  CT ANGIOGRAPHY CHEST  Technique:  Multidetector CT imaging of the chest using the standard protocol during bolus administration of intravenous contrast. Multiplanar reconstructed images including MIPs were obtained and reviewed to evaluate the vascular anatomy.  Contrast: OMNIPAQUE IOHEXOL 350 MG/ML SOLN  Comparison: PA and lateral chest this same day.  Findings: No pulmonary embolus is identified.  Heart size is upper normal.  No hilar, axillary or mediastinal lymphadenopathy.  No pleural or pericardial effusion.  The lungs are clear.  Visualized upper abdomen is unremarkable.  No focal bony abnormality.  IMPRESSION: Negative for pulmonary dose.  Negative study.   Original Report Authenticated By: Holley Dexter, M.D.    Patient feeling better. Getting nebulizer treatments we'll discharge and treat for viral illness.  Diagnoses that have been ruled out:  None  Diagnoses that are still under consideration:  None  Final diagnoses:  Nausea vomiting and diarrhea  Bronchitis    Plan discharge  Devoria Albe, MD, Franz Dell, MD 11/29/12 1715

## 2013-04-12 ENCOUNTER — Ambulatory Visit: Payer: Medicaid Other | Admitting: Obstetrics & Gynecology

## 2013-04-28 ENCOUNTER — Ambulatory Visit: Payer: Medicaid Other | Admitting: Obstetrics & Gynecology

## 2013-05-22 ENCOUNTER — Telehealth: Payer: Self-pay | Admitting: *Deleted

## 2013-05-22 ENCOUNTER — Encounter: Payer: Self-pay | Admitting: Obstetrics & Gynecology

## 2013-05-22 ENCOUNTER — Ambulatory Visit (INDEPENDENT_AMBULATORY_CARE_PROVIDER_SITE_OTHER): Payer: Medicaid Other | Admitting: Obstetrics & Gynecology

## 2013-05-22 VITALS — BP 147/95 | HR 100 | Temp 97.5°F | Ht 63.0 in | Wt 223.9 lb

## 2013-05-22 DIAGNOSIS — Z30011 Encounter for initial prescription of contraceptive pills: Secondary | ICD-10-CM

## 2013-05-22 DIAGNOSIS — Z7251 High risk heterosexual behavior: Secondary | ICD-10-CM

## 2013-05-22 DIAGNOSIS — Z3200 Encounter for pregnancy test, result unknown: Secondary | ICD-10-CM

## 2013-05-22 DIAGNOSIS — D571 Sickle-cell disease without crisis: Secondary | ICD-10-CM

## 2013-05-22 DIAGNOSIS — Z3009 Encounter for other general counseling and advice on contraception: Secondary | ICD-10-CM

## 2013-05-22 LAB — POCT PREGNANCY, URINE: Preg Test, Ur: NEGATIVE

## 2013-05-22 MED ORDER — NORGESTIMATE-ETH ESTRADIOL 0.25-35 MG-MCG PO TABS: 1.0000 | ORAL_TABLET | Freq: Every day | ORAL | Status: DC

## 2013-05-22 NOTE — Telephone Encounter (Signed)
Made referral to [facility] 06/20/13 at 0915 with Dr. Margarita Grizzle-

## 2013-05-22 NOTE — Patient Instructions (Addendum)
Pelvic Pain, Female Female pelvic pain can be caused by many different things and start from a variety of places. Pelvic pain refers to pain that is located in the lower half of the abdomen and between your hips. The pain may occur over a short period of time (acute) or may be reoccurring (chronic). The cause of pelvic pain may be related to disorders affecting the female reproductive organs (gynecologic), but it may also be related to the bladder, kidney stones, an intestinal complication, or muscle or skeletal problems. Getting help right away for pelvic pain is important, especially if there has been severe, sharp, or a sudden onset of unusual pain. It is also important to get help right away because some types of pelvic pain can be life threatening.  CAUSES  Below are only some of the causes of pelvic pain. The causes of pelvic pain can be in one of several categories.   Gynecologic.  Pelvic inflammatory disease.  Sexually transmitted infection.  Ovarian cyst or a twisted ovarian ligament (ovarian torsion).  Uterine lining that grows outside the uterus (endometriosis).  Fibroids, cysts, or tumors.  Ovulation.  Pregnancy.  Pregnancy that occurs outside the uterus (ectopic pregnancy).  Miscarriage.  Labor.  Abruption of the placenta or ruptured uterus.  Infection.  Uterine infection (endometritis).  Bladder infection.  Diverticulitis.  Miscarriage related to a uterine infection (septic abortion).  Bladder.  Inflammation of the bladder (cystitis).  Kidney stone(s).  Gastrointenstinal.  Constipation.  Diverticulitis.  Neurologic.  Trauma.  Feeling pelvic pain because of mental or emotional causes (psychosomatic).  Cancers of the bowel or pelvis. EVALUATION  Your caregiver will want to take a careful history of your concerns. This includes recent changes in your health, a careful gynecologic history of your periods (menses), and a sexual history. Obtaining  your family history and medical history is also important. Your caregiver may suggest a pelvic exam. A pelvic exam will help identify the location and severity of the pain. It also helps in the evaluation of which organ system may be involved. In order to identify the cause of the pelvic pain and be properly treated, your caregiver may order tests. These tests may include:   A pregnancy test.  Pelvic ultrasonography.  An X-ray exam of the abdomen.  A urinalysis or evaluation of vaginal discharge.  Blood tests. HOME CARE INSTRUCTIONS   Only take over-the-counter or prescription medicines for pain, discomfort, or fever as directed by your caregiver.   Rest as directed by your caregiver.   Eat a balanced diet.   Drink enough fluids to make your urine clear or pale yellow, or as directed.   Avoid sexual intercourse if it causes pain.   Apply warm or cold compresses to the lower abdomen depending on which one helps the pain.   Avoid stressful situations.   Keep a journal of your pelvic pain. Write down when it started, where the pain is located, and if there are things that seem to be associated with the pain, such as food or your menstrual cycle.  Follow up with your caregiver as directed.  SEEK MEDICAL CARE IF:  Your medicine does not help your pain.  You have abnormal vaginal discharge. SEEK IMMEDIATE MEDICAL CARE IF:   You have heavy bleeding from the vagina.   Your pelvic pain increases.   You feel lightheaded or faint.   You have chills.   You have pain with urination or blood in your urine.   You have uncontrolled  diarrhea or vomiting.   You have a fever or persistent symptoms for more than 3 days.  You have a fever and your symptoms suddenly get worse.   You are being physically or sexually abused.  MAKE SURE YOU:  Understand these instructions.  Will watch your condition.  Will get help if you are not doing well or get worse. Document  Released: 10/27/2004 Document Revised: 05/31/2012 Document Reviewed: 03/21/2012 [facility] Patient Information 2014 [place], [place]. Endometriosis Endometriosis is a disease that occurs when the endometrium (lining of the uterus) is misplaced outside of its normal location. It may occur in many locations close to the uterus (womb), but commonly on the ovaries, fallopian tubes, vagina (birth canal) and bowel located close to the uterus. Because the uterus sloughs (expels) its lining every month (menses), there is bleeding whereever the endometrial tissue is located. SYMPTOMS  Often there are no symptoms. However, because blood is irritating to tissues not normally exposed to it, when symptoms occur they vary with the location of the misplaced endometrium. Symptoms often include back and abdominal pain. Periods may be heavier and intercourse may be painful. Infertility may be present. You may have all of these symptoms at one time or another or you may have months with no symptoms at all. Although the symptoms occur mainly during menses, they can occur mid-cycle as well, and usually terminate with menopause. DIAGNOSIS  Your caregiver may recommend a blood test and urine test (urinalysis) to help rule out other conditions. Another common test is ultrasound, a painless procedure that uses sound waves to make a sonogram "picture" of abnormal tissue that could be endometriosis. If your bowel movements are painful around your periods, your caregiver may advise a barium enema (an X-ray of the lower bowel), to try to find the source of your pain. This is sometimes confirmed by laparoscopy. Laparoscopy is a procedure where your caregiver looks into your abdomen with a laparoscope (a small pencil sized telescope). Your caregiver may take a tiny piece of tissue (biopsy) from any abnormal tissue to confirm or document your problem. These tissues are sent to the lab and a pathologist looks at them under the microscope to  give a microscopic diagnosis. TREATMENT  Once the diagnosis is made, it can be treated by destruction of the misplaced endometrial tissue using heat (diathermy), laser, cutting (excision), or chemical means. It may also be treated with hormonal therapy. When using hormonal therapy menses are eliminated, therefore eliminating the monthly exposure to blood by the misplaced endometrial tissue. Only in severe cases is it necessary to perform a hysterectomy with removal of the tubes, uterus and ovaries. HOME CARE INSTRUCTIONS   Only take over-the-counter or prescription medicines for pain, discomfort, or fever as directed by your caregiver.  Avoid activities that produce pain, including a physical sexual relationship.  Do not take aspirin as this may increase bleeding when not on hormonal therapy.  See your caregiver for pain or problems not controlled with treatment. SEEK IMMEDIATE MEDICAL CARE IF:   Your pain is severe and is not responding to pain medicine.  You develop severe nausea and vomiting, or you cannot keep foods down.  Your pain localizes to the right lower part of your abdomen (possible appendicitis).  You have swelling or increasing pain in the abdomen.  You have a fever.  You see blood in your stool. MAKE SURE YOU:   Understand these instructions.  Will watch your condition.  Will get help right away if you are  not doing well or get worse. Document Released: 11/27/2000 Document Revised: 02/22/2012 Document Reviewed: 07/18/2008 [facility] Patient Information 2014 [employer], [place].

## 2013-05-22 NOTE — Progress Notes (Signed)
Patient ID: Kathryn Suarez, female   DOB: 1986/11/16, 27 y.o.   MRN: [id number]  Chief Complaint  Patient presents with  . Abdominal Pain  . Exposure to STD    STD screening   2 HPI Kathryn Suarez is a 27 y.o. female.  G0P0 Previously treated for anovulation.Wants cycle control now. Menstrual cramps and intermittent pain. States h/o endometriosis  HPI  Past Medical History  Diagnosis Date  . Asthma   . Hypertension   . Blood transfusion without reported diagnosis   . Ovarian cyst   . Chronic pelvic pain in female   . Kidney stones   . Schizophrenia   . Bipolar disorder   . Sickle cell trait   . Anemia   . DUB (dysfunctional uterine bleeding)     Past Surgical History  Procedure Laterality Date  . Dilation and curettage of uterus    . Laparoscopy abdomen diagnostic N/A     Family History  Problem Relation Age of Onset  . Hypertension Mother     Social History History  Substance Use Topics  . Smoking status: Never Smoker   . Smokeless tobacco: Never Used  . Alcohol Use: No  Same sex partner  Allergies  Allergen Reactions  . Latex Rash    Current Outpatient Prescriptions  Medication Sig Dispense Refill  . albuterol (PROVENTIL HFA;VENTOLIN HFA) 108 (90 BASE) MCG/ACT inhaler Inhale 2 puffs into the lungs every 6 (six) hours as needed. Shortness of breath      . Multiple Vitamin (MULTIVITAMIN) tablet Take 1 tablet by mouth daily.       Marland Kitchen oxyCODONE-acetaminophen (PERCOCET) 5-325 MG per tablet Take 1 tablet by mouth every 6 (six) hours as needed. pain      . norgestimate-ethinyl estradiol (ORTHO-CYCLEN,SPRINTEC,PREVIFEM) 0.25-35 MG-MCG tablet Take 1 tablet by mouth daily.  1 Package  11  . promethazine (PHENERGAN) 25 MG suppository Place 1 suppository (25 mg total) rectally every 6 (six) hours as needed for nausea.  6 each  0  . promethazine (PHENERGAN) 25 MG tablet Take 1 tablet (25 mg total) by mouth every 6 (six) hours as needed for nausea.  6 tablet  0   No  current facility-administered medications for this visit.    Review of Systems Review of Systems  Genitourinary: Positive for menstrual problem and pelvic pain. Negative for vaginal bleeding and vaginal discharge.    Blood pressure 147/95, pulse 100, temperature 97.5 F (36.4 C), temperature source Oral, height 5\' 3"  (1.6 m), weight 223 lb 14.4 oz (101.56 kg), last menstrual period 02/14/2013.  Physical Exam Physical Exam  Constitutional: She is oriented to person, place, and time. She appears well-developed and well-nourished.  obese  Pulmonary/Chest: Effort normal. No respiratory distress.  Abdominal: Soft. She exhibits no mass.  Genitourinary: Vagina normal and uterus normal. No vaginal discharge found.  No mass  Neurological: She is alert and oriented to person, place, and time.  Skin: Skin is warm and dry.  Psychiatric: She has a normal mood and affect. Her behavior is normal.    Data Reviewed Previous notes  Assessment    Pelvic pain, menorrhagia   Wants STD screening  Plan    OCP RTC 6 months    Check labs    Meer Reindl 05/22/2013, 5:10 PM

## 2013-05-23 LAB — HIV ANTIBODY (ROUTINE TESTING W REFLEX): HIV: NONREACTIVE

## 2013-05-23 LAB — HEPATITIS C ANTIBODY: HCV Ab: NEGATIVE

## 2013-05-23 LAB — RPR

## 2013-06-27 ENCOUNTER — Emergency Department (HOSPITAL_COMMUNITY)
Admission: EM | Admit: 2013-06-27 | Discharge: 2013-06-27 | Disposition: A | Discharge status: Home / Self Care | Payer: Medicaid Other | Source: Home / Self Care | Attending: Emergency Medicine | Admitting: Emergency Medicine

## 2013-06-27 ENCOUNTER — Encounter (HOSPITAL_COMMUNITY): Payer: Self-pay | Admitting: Emergency Medicine

## 2013-06-27 DIAGNOSIS — A088 Other specified intestinal infections: Secondary | ICD-10-CM

## 2013-06-27 DIAGNOSIS — A084 Viral intestinal infection, unspecified: Secondary | ICD-10-CM

## 2013-06-27 LAB — POCT URINALYSIS DIP (DEVICE)
Bilirubin Urine: NEGATIVE
Hgb urine dipstick: NEGATIVE
Ketones, ur: NEGATIVE mg/dL
Protein, ur: NEGATIVE mg/dL
pH: 7 (ref 5.0–8.0)

## 2013-06-27 LAB — CBC WITH DIFFERENTIAL/PLATELET
Basophils Absolute: 0 10*3/uL (ref 0.0–0.1)
HCT: 36.8 % (ref 36.0–46.0)
Lymphocytes Relative: 25 % (ref 12–46)
Lymphs Abs: 2.7 10*3/uL (ref 0.7–4.0)
Neutro Abs: 7.5 10*3/uL (ref 1.7–7.7)
Platelets: 239 10*3/uL (ref 150–400)
RBC: 4.73 MIL/uL (ref 3.87–5.11)
RDW: 12.7 % (ref 11.5–15.5)
WBC: 11 10*3/uL — ABNORMAL HIGH (ref 4.0–10.5)

## 2013-06-27 LAB — POCT I-STAT, CHEM 8
BUN: 8 mg/dL (ref 6–23)
Creatinine, Ser: 0.9 mg/dL (ref 0.50–1.10)
Glucose, Bld: 92 mg/dL (ref 70–99)
Potassium: 3.6 mEq/L (ref 3.5–5.1)
Sodium: 142 mEq/L (ref 135–145)

## 2013-06-27 MED ORDER — MECLIZINE HCL 25 MG PO TABS: 25.0000 mg | ORAL_TABLET | Freq: Three times a day (TID) | ORAL | Status: DC | PRN

## 2013-06-27 MED ORDER — ONDANSETRON 4 MG PO TBDP
8.0000 mg | ORAL_TABLET | Freq: Once | ORAL | Status: AC
  Administered 2013-06-27: 8 mg via ORAL

## 2013-06-27 MED ORDER — ONDANSETRON HCL 8 MG PO TABS: 8.0000 mg | ORAL_TABLET | Freq: Three times a day (TID) | ORAL | Status: DC | PRN

## 2013-06-27 MED ORDER — SODIUM CHLORIDE 0.9 % IV SOLN
INTRAVENOUS | Status: DC
  Administered 2013-06-27: 19:00:00 via INTRAVENOUS

## 2013-06-27 NOTE — ED Notes (Signed)
C/o dizziness onset Sat. While sitting and standing.  Vomited x 1 Mon.  C/o fatigue and sleepy.

## 2013-06-27 NOTE — ED Provider Notes (Signed)
Chief Complaint:   Chief Complaint  Patient presents with  . Dizziness  . Fatigue  . Emesis    History of Present Illness:    Kathryn Suarez is a 27 year old female who and has a two-day history of dizziness, fever, headache, nausea, and vomiting. The dizziness feels like spinning. It comes and goes. Is worse if she stands up or she lies flat. She also feels lightheaded and tired. She denies any difficulty hearing, ear pain, ringing in the ears. She's had a headache which is "like a migraine headache." She's also had a fever by her report of 102.1 at home, however today her temperature is normal. She has lower abdominal pain which has been going on for years. She has a history of endometriosis and is in pain management getting Percocet, 3-4 per day. She also gives a history of sickle cell anemia, however this diagnosis is in doubt. Reviewing her CBCs in the past they've all been normal or near normal with only mild decrease in her hemoglobin which may be due to iron deficiency. Her reticulocyte counts have always been within the normal range. At one point she was confronted about this by the emergency room staff and told them that she had sickle cell trait instead of sickle cell anemia. Today she states she has sickle cell anemia. She states all her records of this are in [place]. She has never had a hemoglobin electrophoresis done here. She is not in a sickle cell clinic or program and has never taken hydroxyurea. The patient states her last menstrual period was in January. The patient is sexually active but only with a single female partner. She's had some nausea and vomiting the past couple days with one episode of vomiting yesterday and one today. She denies any hematemesis, coffee-ground emesis, or bilious emesis. She's had no diarrhea, blood in the stool, or melena. She denies any URI symptoms, coughing, wheezing, shortness of breath, chest pain, palpitations, or focal neurological  symptoms.  Review of Systems:  Other than noted above, the patient denies any of the following symptoms: Systemic:  No fever, chills, fatigue, or weight loss. Eye:  No blurred vision, visual change or diplopia. ENT:  No ear pain, tinnitus, hearing loss, nasal congestion, or rhinorrhea. Cardiac:  No chest pain, dyspnea, palpitations or syncope. Neuro:  No headache, paresthesias, weakness, trouble with speech, coordination or ambulation.  PMFSH:  Past medical history, family history, social history, meds, and allergies were reviewed.    Physical Exam:   Vital signs:  BP 131/89  Pulse 105  Temp(Src) 97.9 F (36.6 C) (Oral)  Resp 16  SpO2 100%  LMP 01/16/2013 Filed Vitals:   06/27/13 1734 06/27/13 1820 Supine  06/27/13 1822 Sitting  06/27/13 1824 Standing   BP: 134/96 124/63 140/103 131/89  Pulse: 71 68 89 105  Temp: 97.9 F (36.6 C)     TempSrc: Oral     Resp: 16     SpO2: 100%      General:  Alert, oriented times 3, in no distress. Eye:  PERRL, full EOM, no nystagmus. ENT:  TMs and canals normal.  Nasal mucosa normal.  Pharynx clear. Neck:  No adenopathy, tenderness, or mass.  Thyroid normal.  No carotid bruit. Lungs:  Breath sounds clear and equal bilaterally.  No wheezes, rales or rhonchi. Heart:  Regular rhythm.  No gallops, murmers, or rubs. Abdomen: Soft, flat, nondistended. There is mild tenderness to palpation across the entire lower abdomen without guarding or rebound. No  organomegaly or mass. Bowel sounds are hyperactive. Neuro:  Alert and oriented times 3.  Cranial nerves intact.  No pronator drift.  Finger to nose normal.  No focal weakness.  Sensation intact to light touch.  Romberg's sign negative, gait normal.  Able to do tandem gait well.  Hallpike Dix maneuver was negative.  Labs:   Results for orders placed during the hospital encounter of 06/27/13  CBC WITH DIFFERENTIAL      Result Value Range   WBC 11.0 (*) 4.0 - 10.5 K/uL   RBC 4.73  3.87 - 5.11 MIL/uL    Hemoglobin 13.1  12.0 - 15.0 g/dL   HCT 36.6  44.0 - 34.7 %   MCV 77.8 (*) 78.0 - 100.0 fL   MCH 27.7  26.0 - 34.0 pg   MCHC 35.6  30.0 - 36.0 g/dL   RDW 42.5  95.6 - 38.7 %   Platelets 239  150 - 400 K/uL   Neutrophils Relative % 68  43 - 77 %   Neutro Abs 7.5  1.7 - 7.7 K/uL   Lymphocytes Relative 25  12 - 46 %   Lymphs Abs 2.7  0.7 - 4.0 K/uL   Monocytes Relative 5  3 - 12 %   Monocytes Absolute 0.5  0.1 - 1.0 K/uL   Eosinophils Relative 2  0 - 5 %   Eosinophils Absolute 0.3  0.0 - 0.7 K/uL   Basophils Relative 0  0 - 1 %   Basophils Absolute 0.0  0.0 - 0.1 K/uL  POCT I-STAT, CHEM 8      Result Value Range   Sodium 142  135 - 145 mEq/L   Potassium 3.6  3.5 - 5.1 mEq/L   Chloride 101  96 - 112 mEq/L   BUN 8  6 - 23 mg/dL   Creatinine, Ser 5.64  0.50 - 1.10 mg/dL   Glucose, Bld 92  70 - 99 mg/dL   Calcium, Ion 3.32  9.51 - 1.23 mmol/L   TCO2 29  0 - 100 mmol/L   Hemoglobin 13.6  12.0 - 15.0 g/dL   HCT 88.4  16.6 - 06.3 %  POCT URINALYSIS DIP (DEVICE)      Result Value Range   Glucose, UA NEGATIVE  NEGATIVE mg/dL   Bilirubin Urine NEGATIVE  NEGATIVE   Ketones, ur NEGATIVE  NEGATIVE mg/dL   Specific Gravity, Urine 1.020  1.005 - 1.030   Hgb urine dipstick NEGATIVE  NEGATIVE   pH 7.0  5.0 - 8.0   Protein, ur NEGATIVE  NEGATIVE mg/dL   Urobilinogen, UA 0.2  0.0 - 1.0 mg/dL   Nitrite NEGATIVE  NEGATIVE   Leukocytes, UA NEGATIVE  NEGATIVE      Course in Urgent Care Center:   She was given Zofran ODT 8 mg sublingually and normal saline 1 L. Thereafter she felt better, felt like she could eat or drink.  Assessment:  The encounter diagnosis was Viral gastroenteritis.  She was a little orthostatic with her postural vital signs, thus the IV fluids. She did feel better thereafter. I think she had a viral syndrome with mild dehydration. I don't think she has sickle cell anemia. She'll need a hemoglobin electrophoresis to rule this out completely.  Plan:   1.  The following  meds were prescribed:   Discharge Medication List as of 06/27/2013  7:59 PM    START taking these medications   Details  meclizine (ANTIVERT) 25 MG tablet Take 1 tablet (25  mg total) by mouth 3 (three) times daily as needed for dizziness., Starting 06/27/2013, Until Discontinued, Normal    ondansetron (ZOFRAN) 8 MG tablet Take 1 tablet (8 mg total) by mouth every 8 (eight) hours as needed for nausea., Starting 06/27/2013, Until Discontinued, Normal       2.  The patient was instructed in symptomatic care and handouts were given. 3.  The patient was told to return if becoming worse in any way, if no better in 3 or 4 days, and given some red flag symptoms such as persistent vomiting or worsening abdominal pain that would indicate earlier return. 4.  Follow up here if necessary.    Reuben Likes, MD 06/27/13 2108

## 2013-07-24 ENCOUNTER — Encounter: Payer: Self-pay | Admitting: *Deleted

## 2013-08-11 ENCOUNTER — Telehealth: Payer: Self-pay

## 2013-08-11 NOTE — Telephone Encounter (Signed)
Pt called and stated that she has an appt scheduled two weeks out and she has been bleeding for 16 days off and on.  Called pt and left message that we are returning her call that we closed currently and will not reopen til Tuesday morning.

## 2013-08-13 ENCOUNTER — Inpatient Hospital Stay (HOSPITAL_COMMUNITY)
Admission: AD | Admit: 2013-08-13 | Discharge: 2013-08-14 | Disposition: A | Discharge status: Home / Self Care | Payer: Medicaid Other | Source: Ambulatory Visit | Attending: Obstetrics & Gynecology | Admitting: Obstetrics & Gynecology

## 2013-08-13 ENCOUNTER — Encounter (HOSPITAL_COMMUNITY): Payer: Self-pay | Admitting: *Deleted

## 2013-08-13 DIAGNOSIS — N949 Unspecified condition associated with female genital organs and menstrual cycle: Secondary | ICD-10-CM | POA: Insufficient documentation

## 2013-08-13 DIAGNOSIS — N938 Other specified abnormal uterine and vaginal bleeding: Secondary | ICD-10-CM | POA: Insufficient documentation

## 2013-08-13 LAB — CBC
MCH: 26.8 pg (ref 26.0–34.0)
MCHC: 34.7 g/dL (ref 30.0–36.0)
MCV: 77.2 fL — ABNORMAL LOW (ref 78.0–100.0)
Platelets: 208 10*3/uL (ref 150–400)
RBC: 4.26 MIL/uL (ref 3.87–5.11)

## 2013-08-13 LAB — URINALYSIS, ROUTINE W REFLEX MICROSCOPIC
Bilirubin Urine: NEGATIVE
Glucose, UA: NEGATIVE mg/dL
Nitrite: NEGATIVE
Specific Gravity, Urine: 1.015 (ref 1.005–1.030)
pH: 6 (ref 5.0–8.0)

## 2013-08-13 LAB — WET PREP, GENITAL
Trich, Wet Prep: NONE SEEN
Yeast Wet Prep HPF POC: NONE SEEN

## 2013-08-13 LAB — URINE MICROSCOPIC-ADD ON

## 2013-08-13 MED ORDER — ONDANSETRON HCL 4 MG PO TABS: 4.0000 mg | ORAL_TABLET | Freq: Four times a day (QID) | ORAL | Status: DC

## 2013-08-13 MED ORDER — IBUPROFEN 400 MG PO TABS
400.0000 mg | ORAL_TABLET | Freq: Once | ORAL | Status: AC
  Administered 2013-08-13: 400 mg via ORAL
  Filled 2013-08-13: qty 1

## 2013-08-13 MED ORDER — ONDANSETRON 8 MG PO TBDP
8.0000 mg | ORAL_TABLET | Freq: Once | ORAL | Status: AC
  Administered 2013-08-13: 8 mg via ORAL
  Filled 2013-08-13: qty 1

## 2013-08-13 NOTE — MAU Note (Addendum)
Bleeding x 15 days, saturates pad (overnight) 1/ hr. Nauseated, dizzy, no appetite Face swollen on left, states this happens from time to time LMP January,had period due to medication for infertility, female partner.  No other infertility workup since January D&C Dr Okey Dupre x 2 years ago, and again in ( 2013) [place],  Goes to Pain clinic for chronic abd pain

## 2013-08-13 NOTE — MAU Provider Note (Signed)
History     CSN: [id number]  Arrival date and time: 08/13/13 2112   First Provider Initiated Contact with Patient 08/13/13 2235      No chief complaint on file.  HPI Comments: Kathryn Suarez 27 y.o. G0P0 a patient of Dr Brayton Layman who comes to MAU today for vaginal bleeding ongoing x 15 days. She states she has had to change pads every hour with large blood clots. She was seen by Dr Debroah Loop in the Clinic in June, he put her on [facility] and she took them for one week and stopped. She feels the BCPs actually bring on her menses and the only way to stop it is with a D&C. She has a long standing history of abdominal pain which has been worked up here and in [facility]. She is currently going to the Pain Clinic and takes 3-4 Percocet per day.        Past Medical History  Diagnosis Date  . Asthma   . Hypertension   . Ovarian cyst   . Chronic pelvic pain in female   . Kidney stones   . Schizophrenia   . Bipolar disorder   . Sickle cell trait   . Anemia   . DUB (dysfunctional uterine bleeding)   . Blood transfusion without reported diagnosis     pt has had more than 10 transfusions    Past Surgical History  Procedure Laterality Date  . Dilation and curettage of uterus    . Laparoscopy abdomen diagnostic N/A     Family History  Problem Relation Age of Onset  . Hypertension Mother   . Sickle cell trait Father     History  Substance Use Topics  . Smoking status: Never Smoker   . Smokeless tobacco: Never Used  . Alcohol Use: No    Allergies:  Allergies  Allergen Reactions  . Latex Rash    Prescriptions prior to admission  Medication Sig Dispense Refill  . albuterol (PROVENTIL HFA;VENTOLIN HFA) 108 (90 BASE) MCG/ACT inhaler Inhale 2 puffs into the lungs every 6 (six) hours as needed. Shortness of breath      . meclizine (ANTIVERT) 25 MG tablet Take 1 tablet (25 mg total) by mouth 3 (three) times daily as needed for dizziness.  28 tablet  0  . Multiple Vitamin  (MULTIVITAMIN) tablet Take 1 tablet by mouth daily.       . norgestimate-ethinyl estradiol (ORTHO-CYCLEN,SPRINTEC,PREVIFEM) 0.25-35 MG-MCG tablet Take 1 tablet by mouth daily.  1 Package  11  . ondansetron (ZOFRAN) 8 MG tablet Take 1 tablet (8 mg total) by mouth every 8 (eight) hours as needed for nausea.  20 tablet  0  . oxyCODONE-acetaminophen (PERCOCET) 5-325 MG per tablet Take 1 tablet by mouth every 6 (six) hours as needed. pain      . promethazine (PHENERGAN) 25 MG suppository Place 1 suppository (25 mg total) rectally every 6 (six) hours as needed for nausea.  6 each  0  . promethazine (PHENERGAN) 25 MG tablet Take 1 tablet (25 mg total) by mouth every 6 (six) hours as needed for nausea.  6 tablet  0    Review of Systems  Constitutional: Negative.   Gastrointestinal: Positive for nausea and abdominal pain.  Genitourinary:       Vaginal bleeding   Musculoskeletal: Positive for back pain.  Skin: Negative.   Neurological: Positive for headaches.   Physical Exam   Blood pressure 104/74, pulse 81, resp. rate 18, height 5\' 3"  (1.6 m),  weight 230 lb (104.327 kg), last menstrual period 07/29/2013, SpO2 100.00%.  Physical Exam  Constitutional: She is oriented to person, place, and time. She appears well-developed and well-nourished.  HENT:  Head: Normocephalic.  Eyes: Pupils are equal, round, and reactive to light.  Respiratory: Effort normal and breath sounds normal.  GI: Soft. Bowel sounds are normal. She exhibits no distension. There is no tenderness. There is no rebound and no guarding.  Genitourinary: No vaginal discharge found.  Vaginal bleeding is moderate  Musculoskeletal: Normal range of motion.  Neurological: She is alert and oriented to person, place, and time.  Skin: Skin is warm and dry.  Psychiatric: She has a normal mood and affect.   Results for orders placed during the hospital encounter of 08/13/13 (from the past 24 hour(s))  URINALYSIS, ROUTINE W REFLEX  MICROSCOPIC     Status: Abnormal   Collection Time    08/13/13 10:25 PM      Result Value Range   Color, Urine YELLOW  YELLOW   APPearance CLEAR  CLEAR   Specific Gravity, Urine 1.015  1.005 - 1.030   pH 6.0  5.0 - 8.0   Glucose, UA NEGATIVE  NEGATIVE mg/dL   Hgb urine dipstick LARGE (*) NEGATIVE   Bilirubin Urine NEGATIVE  NEGATIVE   Ketones, ur NEGATIVE  NEGATIVE mg/dL   Protein, ur NEGATIVE  NEGATIVE mg/dL   Urobilinogen, UA 0.2  0.0 - 1.0 mg/dL   Nitrite NEGATIVE  NEGATIVE   Leukocytes, UA NEGATIVE  NEGATIVE  URINE MICROSCOPIC-ADD ON     Status: Abnormal   Collection Time    08/13/13 10:25 PM      Result Value Range   Squamous Epithelial / LPF FEW (*) RARE   WBC, UA 0-2  <3 WBC/hpf   RBC / HPF 21-50  <3 RBC/hpf   Bacteria, UA FEW (*) RARE  WET PREP, GENITAL     Status: None   Collection Time    08/13/13 10:55 PM      Result Value Range   Yeast Wet Prep HPF POC NONE SEEN  NONE SEEN   Trich, Wet Prep NONE SEEN  NONE SEEN   Clue Cells Wet Prep HPF POC NONE SEEN  NONE SEEN   WBC, Wet Prep HPF POC NONE SEEN  NONE SEEN  CBC     Status: Abnormal   Collection Time    08/13/13 11:24 PM      Result Value Range   WBC 9.7  4.0 - 10.5 K/uL   RBC 4.26  3.87 - 5.11 MIL/uL   Hemoglobin 11.4 (*) 12.0 - 15.0 g/dL   HCT 16.1 (*) 09.6 - 04.5 %   MCV 77.2 (*) 78.0 - 100.0 fL   MCH 26.8  26.0 - 34.0 pg   MCHC 34.7  30.0 - 36.0 g/dL   RDW 40.9  81.1 - 91.4 %   Platelets 208  150 - 400 K/uL     MAU Course  Procedures  MDM Wet prep, declines GC/Chlamydia, CBC Zofran, Motrin  Assessment and Plan  A: DUB P: Pt has an appointment at [facility] in 4 days, she is asked to keep that  Requests prescription for Zofran  Carolynn Serve 08/13/2013, 10:59 PM

## 2013-08-15 NOTE — Telephone Encounter (Signed)
Patient called and left message stating she wants someone to call her, she was at the hospital Saturday or Sunday with this problem and is still having bleeding and was told to call us here at the clinic

## 2013-08-17 ENCOUNTER — Ambulatory Visit (INDEPENDENT_AMBULATORY_CARE_PROVIDER_SITE_OTHER): Payer: Medicaid Other | Admitting: Obstetrics & Gynecology

## 2013-08-17 ENCOUNTER — Encounter: Payer: Self-pay | Admitting: Obstetrics & Gynecology

## 2013-08-17 VITALS — BP 129/76 | HR 93 | Temp 99.1°F | Ht 63.0 in | Wt 231.6 lb

## 2013-08-17 DIAGNOSIS — N921 Excessive and frequent menstruation with irregular cycle: Secondary | ICD-10-CM | POA: Insufficient documentation

## 2013-08-17 DIAGNOSIS — N949 Unspecified condition associated with female genital organs and menstrual cycle: Secondary | ICD-10-CM

## 2013-08-17 DIAGNOSIS — N920 Excessive and frequent menstruation with regular cycle: Secondary | ICD-10-CM

## 2013-08-17 DIAGNOSIS — Z3009 Encounter for other general counseling and advice on contraception: Secondary | ICD-10-CM

## 2013-08-17 DIAGNOSIS — N83209 Unspecified ovarian cyst, unspecified side: Secondary | ICD-10-CM

## 2013-08-17 DIAGNOSIS — G8929 Other chronic pain: Secondary | ICD-10-CM

## 2013-08-17 DIAGNOSIS — N83299 Other ovarian cyst, unspecified side: Secondary | ICD-10-CM

## 2013-08-17 MED ORDER — MEGESTROL ACETATE 40 MG PO TABS: 40.0000 mg | ORAL_TABLET | Freq: Two times a day (BID) | ORAL | Status: DC

## 2013-08-17 NOTE — Progress Notes (Signed)
Still having bleeding issues, stopped birth contol pills in May, wants to restart to see if helps her bleeding.

## 2013-08-17 NOTE — Telephone Encounter (Signed)
Called pt and pt informed me that she has been bleeding for several days now.  I asked pt if she was taking her BCP.  Pt stated "no I stopped taking them in May".  I informed pt that the reason for the BCP was to have her to start having regular cycles.  Pt stated "o, I did not know."  I advised pt that she would need to come in for further management since she did want to take BCP for management.  Pt stated understanding and did not have any other questions.

## 2013-08-17 NOTE — Patient Instructions (Signed)

## 2013-08-18 NOTE — Progress Notes (Signed)
GYNECOLOGY CLINIC ENCOUNTER NOTE  History:  27 y.o. obese G0 with history of sickle cell disease here today for management of AUB.  She has been treated with OCPs in the past, but stopped this when she was undergoing infertility management with Dr. Elesa Hacker.  She has stopped infertility treatments and now wants hormonal treatment of her AUB. Recent hemoglobin on 08/13/13 was 11.4, no signs/symptoms of anemia.  No current heavy bleeding.  The following portions of the patient's history were reviewed and updated as appropriate: allergies, current medications, past family history, past medical history, past social history, past surgical history and problem list.  Review of Systems:  Pertinent items are noted in HPI.  Objective:   BP 129/76  Pulse 93  Temp(Src) 99.1 F (37.3 C)  Ht 5\' 3"  (1.6 m)  Wt 231 lb 9.6 oz (105.053 kg)  BMI 41.04 kg/m2  LMP 07/29/2013 Physical Exam deferred  Assessment & Plan:  Recommended progestin therapy in lieu of OCPs; estrogen may increase risk of thromboembolism especially given that she has SCD.  Patient declines any surgical management for now.  Patient was counseled about Mirena IUD placement, she agreed to this modality and will return for placement.  In the meantime,  Megace was prescribed for patient.   Pelvic ultrasound also ordered to ensure that there are no undiagnosed uterine/ovarian anomalies.  Bleeding precautions reviewed.

## 2013-08-23 ENCOUNTER — Encounter: Payer: Self-pay | Admitting: *Deleted

## 2013-08-23 ENCOUNTER — Ambulatory Visit (HOSPITAL_COMMUNITY)
Admission: RE | Admit: 2013-08-23 | Discharge: 2013-08-23 | Disposition: A | Discharge status: Home / Self Care | Payer: Medicaid Other | Source: Ambulatory Visit | Attending: Obstetrics & Gynecology | Admitting: Obstetrics & Gynecology

## 2013-08-23 DIAGNOSIS — N921 Excessive and frequent menstruation with irregular cycle: Secondary | ICD-10-CM

## 2013-08-23 DIAGNOSIS — G8929 Other chronic pain: Secondary | ICD-10-CM

## 2013-08-23 DIAGNOSIS — N920 Excessive and frequent menstruation with regular cycle: Secondary | ICD-10-CM | POA: Insufficient documentation

## 2013-08-23 DIAGNOSIS — N83209 Unspecified ovarian cyst, unspecified side: Secondary | ICD-10-CM | POA: Insufficient documentation

## 2013-08-23 DIAGNOSIS — D251 Intramural leiomyoma of uterus: Secondary | ICD-10-CM | POA: Insufficient documentation

## 2013-08-23 DIAGNOSIS — N949 Unspecified condition associated with female genital organs and menstrual cycle: Secondary | ICD-10-CM | POA: Insufficient documentation

## 2013-08-24 NOTE — Addendum Note (Signed)
Addended by: Jaynie Collins A on: 08/24/2013 02:21 PM   Modules accepted: Orders

## 2013-08-27 ENCOUNTER — Emergency Department (HOSPITAL_COMMUNITY)
Admission: EM | Admit: 2013-08-27 | Discharge: 2013-08-27 | Disposition: A | Discharge status: Home / Self Care | Payer: Medicaid Other | Attending: Emergency Medicine | Admitting: Emergency Medicine

## 2013-08-27 ENCOUNTER — Encounter (HOSPITAL_COMMUNITY): Payer: Self-pay | Admitting: Physical Medicine and Rehabilitation

## 2013-08-27 DIAGNOSIS — R5381 Other malaise: Secondary | ICD-10-CM | POA: Insufficient documentation

## 2013-08-27 DIAGNOSIS — M6281 Muscle weakness (generalized): Secondary | ICD-10-CM | POA: Insufficient documentation

## 2013-08-27 DIAGNOSIS — Z8742 Personal history of other diseases of the female genital tract: Secondary | ICD-10-CM | POA: Insufficient documentation

## 2013-08-27 DIAGNOSIS — R112 Nausea with vomiting, unspecified: Secondary | ICD-10-CM | POA: Insufficient documentation

## 2013-08-27 DIAGNOSIS — R0789 Other chest pain: Secondary | ICD-10-CM | POA: Insufficient documentation

## 2013-08-27 DIAGNOSIS — D649 Anemia, unspecified: Secondary | ICD-10-CM | POA: Insufficient documentation

## 2013-08-27 DIAGNOSIS — Z91040 Latex allergy status: Secondary | ICD-10-CM | POA: Insufficient documentation

## 2013-08-27 DIAGNOSIS — R42 Dizziness and giddiness: Secondary | ICD-10-CM | POA: Insufficient documentation

## 2013-08-27 DIAGNOSIS — Z5189 Encounter for other specified aftercare: Secondary | ICD-10-CM | POA: Insufficient documentation

## 2013-08-27 DIAGNOSIS — N938 Other specified abnormal uterine and vaginal bleeding: Secondary | ICD-10-CM | POA: Insufficient documentation

## 2013-08-27 DIAGNOSIS — N939 Abnormal uterine and vaginal bleeding, unspecified: Secondary | ICD-10-CM

## 2013-08-27 DIAGNOSIS — N83209 Unspecified ovarian cyst, unspecified side: Secondary | ICD-10-CM | POA: Insufficient documentation

## 2013-08-27 DIAGNOSIS — Z87442 Personal history of urinary calculi: Secondary | ICD-10-CM | POA: Insufficient documentation

## 2013-08-27 DIAGNOSIS — N949 Unspecified condition associated with female genital organs and menstrual cycle: Secondary | ICD-10-CM | POA: Insufficient documentation

## 2013-08-27 DIAGNOSIS — Z3202 Encounter for pregnancy test, result negative: Secondary | ICD-10-CM | POA: Insufficient documentation

## 2013-08-27 DIAGNOSIS — R109 Unspecified abdominal pain: Secondary | ICD-10-CM | POA: Insufficient documentation

## 2013-08-27 DIAGNOSIS — F209 Schizophrenia, unspecified: Secondary | ICD-10-CM | POA: Insufficient documentation

## 2013-08-27 DIAGNOSIS — D5700 Hb-SS disease with crisis, unspecified: Secondary | ICD-10-CM | POA: Insufficient documentation

## 2013-08-27 DIAGNOSIS — G8929 Other chronic pain: Secondary | ICD-10-CM | POA: Insufficient documentation

## 2013-08-27 DIAGNOSIS — Z79899 Other long term (current) drug therapy: Secondary | ICD-10-CM | POA: Insufficient documentation

## 2013-08-27 DIAGNOSIS — J45909 Unspecified asthma, uncomplicated: Secondary | ICD-10-CM | POA: Insufficient documentation

## 2013-08-27 DIAGNOSIS — I10 Essential (primary) hypertension: Secondary | ICD-10-CM | POA: Insufficient documentation

## 2013-08-27 DIAGNOSIS — R0602 Shortness of breath: Secondary | ICD-10-CM | POA: Insufficient documentation

## 2013-08-27 DIAGNOSIS — F319 Bipolar disorder, unspecified: Secondary | ICD-10-CM | POA: Insufficient documentation

## 2013-08-27 LAB — CBC WITH DIFFERENTIAL/PLATELET
Basophils Absolute: 0 10*3/uL (ref 0.0–0.1)
Basophils Relative: 0 % (ref 0–1)
Eosinophils Relative: 5 % (ref 0–5)
HCT: 39.2 % (ref 36.0–46.0)
MCHC: 34.4 g/dL (ref 30.0–36.0)
Monocytes Absolute: 0.4 10*3/uL (ref 0.1–1.0)
Neutro Abs: 5.2 10*3/uL (ref 1.7–7.7)
RDW: 13.4 % (ref 11.5–15.5)

## 2013-08-27 LAB — COMPREHENSIVE METABOLIC PANEL
ALT: 13 U/L (ref 0–35)
AST: 23 U/L (ref 0–37)
Albumin: 4.1 g/dL (ref 3.5–5.2)
Alkaline Phosphatase: 91 U/L (ref 39–117)
Potassium: 3.7 mEq/L (ref 3.5–5.1)
Sodium: 137 mEq/L (ref 135–145)
Total Protein: 8.1 g/dL (ref 6.0–8.3)

## 2013-08-27 LAB — WET PREP, GENITAL
Trich, Wet Prep: NONE SEEN
WBC, Wet Prep HPF POC: NONE SEEN
Yeast Wet Prep HPF POC: NONE SEEN

## 2013-08-27 LAB — RETICULOCYTES: Retic Ct Pct: 1.3 % (ref 0.4–3.1)

## 2013-08-27 LAB — HCG, QUANTITATIVE, PREGNANCY: hCG, Beta Chain, Quant, S: <1 m[IU]/mL (ref <5)

## 2013-08-27 LAB — TYPE AND SCREEN: Antibody Screen: NEGATIVE

## 2013-08-27 MED ORDER — HYDROCODONE-ACETAMINOPHEN 5-325 MG PO TABS
2.0000 | ORAL_TABLET | Freq: Once | ORAL | Status: DC
  Filled 2013-08-27: qty 2

## 2013-08-27 MED ORDER — ONDANSETRON HCL 4 MG/2ML IJ SOLN
4.0000 mg | Freq: Once | INTRAMUSCULAR | Status: AC
  Administered 2013-08-27: 4 mg via INTRAVENOUS
  Filled 2013-08-27: qty 2

## 2013-08-27 MED ORDER — SODIUM CHLORIDE 0.9 % IV BOLUS (SEPSIS)
1000.0000 mL | Freq: Once | INTRAVENOUS | Status: AC
  Administered 2013-08-27: 1000 mL via INTRAVENOUS

## 2013-08-27 MED ORDER — MORPHINE SULFATE 4 MG/ML IJ SOLN
4.0000 mg | Freq: Once | INTRAMUSCULAR | Status: AC
  Administered 2013-08-27: 4 mg via INTRAVENOUS
  Filled 2013-08-27: qty 1

## 2013-08-27 MED ORDER — MEGESTROL ACETATE 20 MG PO TABS: ORAL_TABLET | ORAL | Status: DC

## 2013-08-27 NOTE — ED Notes (Signed)
Pt ambulated to restroom, states slight dizziness.

## 2013-08-27 NOTE — ED Notes (Addendum)
Pt states vaginal bleeding x1 month. States she passed large clots this morning. Also states 9/10 pain to chest and abdomen. States she is worried because this doesn't feel like sickle cell pain. Pt moaning and crying upon arrival to ED. She is alert and oriented x4. Able to move all extremities. Abdomen soft and non tender to palpation. Denies urinary symptoms. Pt also states decreased appetite.

## 2013-08-27 NOTE — ED Notes (Signed)
Pt presents to department via [facility] for evaluation of vaginal bleeding, possible sickle cell exacerbation, chest tightness and nausea/vomiting. Pt is conscious alert and oriented x4. 8/10 pain upon arrival to ED.

## 2013-08-27 NOTE — ED Notes (Signed)
Pelvic exam performed. Pt tolerated without difficulty. Cultures sent to lab.

## 2013-08-27 NOTE — ED Notes (Signed)
Reminded PT of need for urine specimen. PT verbalized understanding

## 2013-08-27 NOTE — ED Provider Notes (Signed)
CSN: [id number]     Arrival date & time 08/27/13  0730 History   First MD Initiated Contact with Patient 08/27/13 [phone]     Chief Complaint  Patient presents with  . Vaginal Bleeding  . Sickle Cell Pain Crisis  . Emesis   (Consider location/radiation/quality/duration/timing/severity/associated sxs/prior Treatment) HPI Comments: Patient arrives via EMS for vaginal bleeding and abdominal pain. She reports she's had vaginal bleeding for several months and has required transfusions in the past. She believes it got worse today she saw a large clot of blood and fell lightheaded and dizzy with some chest tightness. She is unable to quantify how much bleeding she has a daily basis but states it varies. Overnight she used 2 pads in one hour. Her last transfusion was in 2010. She denies any dysuria, nausea. She did have 2 episodes of vomiting overnight however. Denies any change in bowel habits. Her ultrasound performed 10 days ago that showed a thickened endometrial stripe and a complex ovarian cyst. She is not currently on birth control.  The history is provided by the patient and the EMS personnel.    Past Medical History  Diagnosis Date  . Asthma   . Hypertension   . Ovarian cyst   . Chronic pelvic pain in female   . Kidney stones   . Schizophrenia   . Bipolar disorder   . Sickle cell trait   . Anemia   . DUB (dysfunctional uterine bleeding)   . Blood transfusion without reported diagnosis     pt has had more than 10 transfusions   Past Surgical History  Procedure Laterality Date  . Dilation and curettage of uterus    . Laparoscopy abdomen diagnostic N/A    Family History  Problem Relation Age of Onset  . Hypertension Mother   . Sickle cell trait Father    History  Substance Use Topics  . Smoking status: Never Smoker   . Smokeless tobacco: Never Used  . Alcohol Use: No   OB History   Grav Para Term Preterm Abortions TAB SAB Ect Mult Living   0              Review of  Systems  Constitutional: Positive for fatigue. Negative for fever, activity change and appetite change.  HENT: Negative for congestion and rhinorrhea.   Respiratory: Positive for chest tightness and shortness of breath. Negative for cough.   Cardiovascular: Negative for chest pain.  Gastrointestinal: Positive for nausea, vomiting and abdominal pain. Negative for diarrhea.  Genitourinary: Positive for vaginal bleeding, menstrual problem and pelvic pain. Negative for dysuria and vaginal pain.  Musculoskeletal: Negative for back pain.  Skin: Negative for rash.  Neurological: Positive for dizziness, weakness and light-headedness. Negative for headaches.   A complete 10 system review of systems was obtained and all systems are negative except as noted in the HPI and PMH.   Allergies  Latex  Home Medications   Current Outpatient Rx  Name  Route  Sig  Dispense  Refill  . albuterol (PROVENTIL HFA;VENTOLIN HFA) 108 (90 BASE) MCG/ACT inhaler   Inhalation   Inhale 2 puffs into the lungs every 6 (six) hours as needed. Shortness of breath         . Multiple Vitamin (MULTIVITAMIN) tablet   Oral   Take 1 tablet by mouth daily.          Marland Kitchen oxyCODONE-acetaminophen (PERCOCET) 5-325 MG per tablet   Oral   Take 1 tablet by mouth every 6 (six)  hours as needed. pain         . megestrol (MEGACE) 20 MG tablet      Take 3 tablets daily for 5 days, then 2 tablets daily for 5 days, then 1 tablet daily PO   45 tablet   0    BP 110/67  Pulse 77  Temp(Src) 98.9 F (37.2 C) (Oral)  Resp 18  SpO2 100%  LMP 07/29/2013 Physical Exam  Constitutional: She is oriented to person, place, and time. She appears well-developed and well-nourished. No distress.  HENT:  Head: Normocephalic and atraumatic.  Mouth/Throat: Oropharynx is clear and moist. No oropharyngeal exudate.  No pallor.  Eyes: Conjunctivae and EOM are normal. Pupils are equal, round, and reactive to light.  Neck: Normal range of  motion. Neck supple.  Cardiovascular: Normal rate, regular rhythm and normal heart sounds.   No murmur heard. Pulmonary/Chest: Effort normal and breath sounds normal. No respiratory distress.  Abdominal: Soft. There is no tenderness. There is no guarding.  Mild lower abdominal tenderness, no guarding or rebound  Genitourinary:  Normal external genitalia. Dark blood in the vaginal vault with some dark blood oozing from the cervix. Cervix closed. No CMT or lateralizing adnexal tenderness.  Musculoskeletal: Normal range of motion. She exhibits no edema and no tenderness.  Neurological: She is alert and oriented to person, place, and time. No cranial nerve deficit. She exhibits normal muscle tone. Coordination normal.  Skin: Skin is warm.    ED Course  Procedures (including critical care time) Labs Review Labs Reviewed  WET PREP, GENITAL  GC/CHLAMYDIA PROBE AMP  CBC WITH DIFFERENTIAL  PROTIME-INR  RETICULOCYTES  TROPONIN I  COMPREHENSIVE METABOLIC PANEL  HCG, QUANTITATIVE, PREGNANCY  URINALYSIS, ROUTINE W REFLEX MICROSCOPIC  PREGNANCY, URINE  TYPE AND SCREEN   Imaging Review No results found.  MDM   1. Vaginal bleeding    Vaginal bleeding, acute on chronic, with lightheadeness and lower abdominal pain.  Abdomen soft without peritoneal signs. Hemoglobin stable. Orthostatics positive by heart rate. Per gynecology notes, patient did not want to be on OCPs and is waiting to get an IUD placed. Case discussed with Dr. Despina Hidden. He recommends continuing Megestrol until IUD is placed.  Vitals have remained stable in the ED. Patient ambulatory and tolerating by mouth. Will followup with gynecology this week. Return precautions discussed. He'll return with worsening bleeding, chest tightness, shortness of breath, dizziness or any other concerns. Patient given prescription for Megestrol with instructions to followup with gynecology.    Date: 08/27/2013  Rate: 85  Rhythm: normal sinus  rhythm  QRS Axis: normal  Intervals: normal  ST/T Wave abnormalities: normal  Conduction Disutrbances:none  Narrative Interpretation:   Old EKG Reviewed: none available   BP 110/67  Pulse 77  Temp(Src) 98.9 F (37.2 C) (Oral)  Resp 18  SpO2 100%  LMP 07/29/2013   Glynn Octave, MD 08/27/13 1524

## 2013-08-28 ENCOUNTER — Ambulatory Visit (INDEPENDENT_AMBULATORY_CARE_PROVIDER_SITE_OTHER): Payer: Medicaid Other | Admitting: Obstetrics & Gynecology

## 2013-08-28 ENCOUNTER — Encounter: Payer: Self-pay | Admitting: Obstetrics & Gynecology

## 2013-08-28 VITALS — BP 146/77 | HR 81 | Temp 99.4°F | Ht 61.0 in | Wt 229.9 lb

## 2013-08-28 DIAGNOSIS — N921 Excessive and frequent menstruation with irregular cycle: Secondary | ICD-10-CM

## 2013-08-28 DIAGNOSIS — Z3049 Encounter for surveillance of other contraceptives: Secondary | ICD-10-CM

## 2013-08-28 DIAGNOSIS — N920 Excessive and frequent menstruation with regular cycle: Secondary | ICD-10-CM

## 2013-08-28 DIAGNOSIS — G8929 Other chronic pain: Secondary | ICD-10-CM

## 2013-08-28 DIAGNOSIS — Z01419 Encounter for gynecological examination (general) (routine) without abnormal findings: Secondary | ICD-10-CM

## 2013-08-28 DIAGNOSIS — N949 Unspecified condition associated with female genital organs and menstrual cycle: Secondary | ICD-10-CM

## 2013-08-28 DIAGNOSIS — Z30430 Encounter for insertion of intrauterine contraceptive device: Secondary | ICD-10-CM

## 2013-08-28 MED ORDER — LEVONORGESTREL 20 MCG/24HR IU IUD: 1.0000 | INTRAUTERINE_SYSTEM | Freq: Once | INTRAUTERINE | Status: DC

## 2013-08-28 MED ORDER — LEVONORGESTREL 20 MCG/24HR IU IUD
INTRAUTERINE_SYSTEM | Freq: Once | INTRAUTERINE | Status: AC
  Administered 2013-08-28: 16:00:00 via INTRAUTERINE

## 2013-08-28 NOTE — Progress Notes (Signed)
Patient ID: Kathryn Suarez, female   DOB: Jan 25, 1986, 27 y.o.   MRN: [id number] IUD Insertion Procedure Note  Pre-operative Diagnosis: Menorrhagia, desires Mirena  Post-operative Diagnosis: same  Indications: contraception  Procedure Details  Urine pregnancy test was done yesterday and result was negative.  The risks (including infection, bleeding, pain, and uterine perforation) and benefits of the procedure were explained to the patient and Written informed consent was obtained.    Cervix cleansed with Betadine. Uterus sounded to 8 cm. IUD inserted without difficulty. String visible and trimmed. Patient tolerated procedure well.  IUD Information: Mirena.  Condition: Stable  Complications: None  Plan:  The patient was advised to call for any fever or for prolonged or severe pain or bleeding. She was advised to use NSAID as needed for mild to moderate pain.   Attending Physician Documentation: I was present for or participated in the entire procedure, including opening and closing.   Adam Phenix, MD 08/28/2013

## 2013-08-28 NOTE — Patient Instructions (Signed)
Levonorgestrel intrauterine device (IUD) What is this medicine? LEVONORGESTREL IUD (LEE voe nor jes trel) is a contraceptive (birth control) device. The device is placed inside the uterus by a healthcare professional. It is used to prevent pregnancy and can also be used to treat heavy bleeding that occurs during your period. Depending on the device, it can be used for 3 to 5 years. This medicine may be used for other purposes; ask your health care provider or pharmacist if you have questions. What should I tell my health care provider before I take this medicine? They need to know if you have any of these conditions: -abnormal Pap smear -cancer of the breast, uterus, or cervix -diabetes -endometritis -genital or pelvic infection now or in the past -have more than one sexual partner or your partner has more than one partner -heart disease -history of an ectopic or tubal pregnancy -immune system problems -IUD in place -liver disease or tumor -problems with blood clots or take blood-thinners -use intravenous drugs -uterus of unusual shape -vaginal bleeding that has not been explained -an unusual or allergic reaction to levonorgestrel, other hormones, silicone, or polyethylene, medicines, foods, dyes, or preservatives -pregnant or trying to get pregnant -breast-feeding How should I use this medicine? This device is placed inside the uterus by a health care professional. Talk to your pediatrician regarding the use of this medicine in children. Special care may be needed. Overdosage: If you think you have taken too much of this medicine contact a poison control center or emergency room at once. NOTE: This medicine is only for you. Do not share this medicine with others. What if I miss a dose? This does not apply. What may interact with this medicine? Do not take this medicine with any of the following medications: -amprenavir -bosentan -fosamprenavir This medicine may also interact with  the following medications: -aprepitant -barbiturate medicines for inducing sleep or treating seizures -bexarotene -griseofulvin -medicines to treat seizures like carbamazepine, ethotoin, felbamate, oxcarbazepine, phenytoin, topiramate -modafinil -pioglitazone -rifabutin -rifampin -rifapentine -some medicines to treat HIV infection like atazanavir, indinavir, lopinavir, nelfinavir, tipranavir, ritonavir -St. John's wort -warfarin This list may not describe all possible interactions. Give your health care provider a list of all the medicines, herbs, non-prescription drugs, or dietary supplements you use. Also tell them if you smoke, drink alcohol, or use illegal drugs. Some items may interact with your medicine. What should I watch for while using this medicine? Visit your doctor or health care professional for regular check ups. See your doctor if you or your partner has sexual contact with others, becomes HIV positive, or gets a sexual transmitted disease. This product does not protect you against HIV infection (AIDS) or other sexually transmitted diseases. You can check the placement of the IUD yourself by reaching up to the top of your vagina with clean fingers to feel the threads. Do not pull on the threads. It is a good habit to check placement after each menstrual period. Call your doctor right away if you feel more of the IUD than just the threads or if you cannot feel the threads at all. The IUD may come out by itself. You may become pregnant if the device comes out. If you notice that the IUD has come out use a backup birth control method like condoms and call your health care provider. Using tampons will not change the position of the IUD and are okay to use during your period. What side effects may I notice from receiving this medicine?   Side effects that you should report to your doctor or health care professional as soon as possible: -allergic reactions like skin rash, itching or  hives, swelling of the face, lips, or tongue -fever, flu-like symptoms -genital sores -high blood pressure -no menstrual period for 6 weeks during use -pain, swelling, warmth in the leg -pelvic pain or tenderness -severe or sudden headache -signs of pregnancy -stomach cramping -sudden shortness of breath -trouble with balance, talking, or walking -unusual vaginal bleeding, discharge -yellowing of the eyes or skin Side effects that usually do not require medical attention (report to your doctor or health care professional if they continue or are bothersome): -acne -breast pain -change in sex drive or performance -changes in weight -cramping, dizziness, or faintness while the device is being inserted -headache -irregular menstrual bleeding within first 3 to 6 months of use -nausea This list may not describe all possible side effects. Call your doctor for medical advice about side effects. You may report side effects to FDA at 1-800-FDA-1088. Where should I keep my medicine? This does not apply. NOTE: This sheet is a summary. It may not cover all possible information. If you have questions about this medicine, talk to your doctor, pharmacist, or health care provider.  2013, [employer]. (12/31/2011 1:54:04 PM)  

## 2013-09-05 ENCOUNTER — Telehealth: Payer: Self-pay | Admitting: General Practice

## 2013-09-05 NOTE — Telephone Encounter (Signed)
Message copied by Kathee Delton on Tue Sep 05, 2013 10:29 AM ------      Message from: Jaynie Collins A      Created: Thu Aug 24, 2013  2:17 PM       Please schedule patient for MRI of pelvis to work up her left ovarian complex cyst.  Order placed. ------

## 2013-09-05 NOTE — Telephone Encounter (Signed)
MRI appt made for 9/29 @ 5. Called patient and informed her of ultrasound results and need for MRI for further evaluation and appt made for 9/29 at [facility] long. Patient verbalized understanding and had no further questions

## 2013-09-11 ENCOUNTER — Ambulatory Visit (HOSPITAL_COMMUNITY)
Admission: RE | Admit: 2013-09-11 | Discharge: 2013-09-11 | Disposition: A | Discharge status: Home / Self Care | Payer: Medicaid Other | Source: Ambulatory Visit | Attending: Obstetrics & Gynecology | Admitting: Obstetrics & Gynecology

## 2013-09-11 DIAGNOSIS — G8929 Other chronic pain: Secondary | ICD-10-CM

## 2013-09-11 DIAGNOSIS — N949 Unspecified condition associated with female genital organs and menstrual cycle: Secondary | ICD-10-CM | POA: Insufficient documentation

## 2013-09-11 DIAGNOSIS — E282 Polycystic ovarian syndrome: Secondary | ICD-10-CM | POA: Insufficient documentation

## 2013-09-11 DIAGNOSIS — N83299 Other ovarian cyst, unspecified side: Secondary | ICD-10-CM

## 2013-09-11 MED ORDER — GADOBENATE DIMEGLUMINE 529 MG/ML IV SOLN
20.0000 mL | Freq: Once | INTRAVENOUS | Status: AC | PRN
  Administered 2013-09-11: 20 mL via INTRAVENOUS

## 2013-09-12 ENCOUNTER — Encounter: Payer: Self-pay | Admitting: Obstetrics & Gynecology

## 2013-09-12 DIAGNOSIS — N83201 Unspecified ovarian cyst, right side: Secondary | ICD-10-CM | POA: Insufficient documentation

## 2013-09-13 ENCOUNTER — Telehealth: Payer: Self-pay | Admitting: Obstetrics and Gynecology

## 2013-09-13 NOTE — Telephone Encounter (Addendum)
Message copied by Toula Moos on Wed Sep 13, 2013  8:46 AM   ------ Called patient and notified of result below. Patient states understanding. She will call in December to make a follow up appt in January. Patient agrees and satisfied.       Message from: Jaynie Collins A      Created: Tue Sep 12, 2013 11:09 AM       MRI showed 3 cm right ovarian complex cyst decreased from 6 cm on prior scan done on 08/24/13. It is resolving on its own. Recommend follow up ultrasound in 3 months to ensure resolution.  Please call to inform patient of results and recommendations. ------

## 2013-09-29 ENCOUNTER — Ambulatory Visit: Payer: Medicaid Other | Admitting: Obstetrics and Gynecology

## 2013-10-05 ENCOUNTER — Ambulatory Visit (INDEPENDENT_AMBULATORY_CARE_PROVIDER_SITE_OTHER): Payer: Medicaid Other | Admitting: Obstetrics & Gynecology

## 2013-10-05 ENCOUNTER — Encounter: Payer: Self-pay | Admitting: Obstetrics & Gynecology

## 2013-10-05 VITALS — BP 128/88 | HR 87 | Temp 99.0°F | Ht 63.0 in | Wt 231.4 lb

## 2013-10-05 DIAGNOSIS — A499 Bacterial infection, unspecified: Secondary | ICD-10-CM

## 2013-10-05 DIAGNOSIS — B9689 Other specified bacterial agents as the cause of diseases classified elsewhere: Secondary | ICD-10-CM

## 2013-10-05 DIAGNOSIS — N760 Acute vaginitis: Secondary | ICD-10-CM

## 2013-10-05 MED ORDER — METRONIDAZOLE 500 MG PO TABS: 500.0000 mg | ORAL_TABLET | Freq: Two times a day (BID) | ORAL | Status: AC

## 2013-10-05 NOTE — Progress Notes (Signed)
  Subjective:    Patient ID: Kathryn Suarez, female    DOB: 1986/04/09, 27 y.o.   MRN: [id number]  HPI G0P0 Patient's last menstrual period was 09/04/2013. Patient comes for IUD string check, placed last month. Some cramps and discharge with odor.   Review of Systems  Genitourinary: Positive for vaginal bleeding (scant), vaginal discharge and pelvic pain.       Objective:   Physical Exam  Constitutional: She is oriented to person, place, and time. She appears well-developed. No distress.  Abdominal: Soft. There is no tenderness.  Genitourinary: Uterus normal. Vaginal discharge (minimal) found.  String seen  Neurological: She is alert and oriented to person, place, and time.  Skin: Skin is warm and dry.  Psychiatric: She has a normal mood and affect. Her behavior is normal.          Assessment & Plan:  Possible BV, Flagyl 500 mg BID 7 days .Adam Phenix, MD 10/05/2013

## 2013-10-11 ENCOUNTER — Encounter: Payer: Self-pay | Admitting: *Deleted

## 2013-12-05 ENCOUNTER — Encounter (HOSPITAL_COMMUNITY): Payer: Self-pay | Admitting: Emergency Medicine

## 2013-12-05 ENCOUNTER — Emergency Department (HOSPITAL_COMMUNITY)
Admission: EM | Admit: 2013-12-05 | Discharge: 2013-12-05 | Discharge status: Against Medical Advice | Payer: Medicaid Other | Attending: Emergency Medicine | Admitting: Emergency Medicine

## 2013-12-05 ENCOUNTER — Emergency Department (HOSPITAL_COMMUNITY)
Admission: EM | Admit: 2013-12-05 | Discharge: 2013-12-05 | Disposition: A | Discharge status: Home / Self Care | Payer: Medicaid Other | Source: Home / Self Care

## 2013-12-05 DIAGNOSIS — M79609 Pain in unspecified limb: Secondary | ICD-10-CM | POA: Insufficient documentation

## 2013-12-05 DIAGNOSIS — D649 Anemia, unspecified: Secondary | ICD-10-CM | POA: Insufficient documentation

## 2013-12-05 DIAGNOSIS — Z8742 Personal history of other diseases of the female genital tract: Secondary | ICD-10-CM | POA: Insufficient documentation

## 2013-12-05 DIAGNOSIS — F319 Bipolar disorder, unspecified: Secondary | ICD-10-CM | POA: Insufficient documentation

## 2013-12-05 DIAGNOSIS — Z5189 Encounter for other specified aftercare: Secondary | ICD-10-CM | POA: Insufficient documentation

## 2013-12-05 DIAGNOSIS — R079 Chest pain, unspecified: Secondary | ICD-10-CM | POA: Insufficient documentation

## 2013-12-05 DIAGNOSIS — K920 Hematemesis: Secondary | ICD-10-CM | POA: Insufficient documentation

## 2013-12-05 DIAGNOSIS — R42 Dizziness and giddiness: Secondary | ICD-10-CM | POA: Insufficient documentation

## 2013-12-05 DIAGNOSIS — J45909 Unspecified asthma, uncomplicated: Secondary | ICD-10-CM | POA: Insufficient documentation

## 2013-12-05 DIAGNOSIS — F209 Schizophrenia, unspecified: Secondary | ICD-10-CM | POA: Insufficient documentation

## 2013-12-05 DIAGNOSIS — I10 Essential (primary) hypertension: Secondary | ICD-10-CM | POA: Insufficient documentation

## 2013-12-05 DIAGNOSIS — M6281 Muscle weakness (generalized): Secondary | ICD-10-CM | POA: Insufficient documentation

## 2013-12-05 DIAGNOSIS — Z87442 Personal history of urinary calculi: Secondary | ICD-10-CM | POA: Insufficient documentation

## 2013-12-05 DIAGNOSIS — IMO0001 Reserved for inherently not codable concepts without codable children: Secondary | ICD-10-CM | POA: Insufficient documentation

## 2013-12-05 DIAGNOSIS — R109 Unspecified abdominal pain: Secondary | ICD-10-CM | POA: Insufficient documentation

## 2013-12-05 DIAGNOSIS — D5700 Hb-SS disease with crisis, unspecified: Secondary | ICD-10-CM | POA: Insufficient documentation

## 2013-12-05 NOTE — ED Notes (Signed)
Pt continues with no answer.

## 2013-12-05 NOTE — ED Notes (Signed)
Chart  Review    For  [facility]  [employer]

## 2013-12-05 NOTE — ED Notes (Signed)
Pt is here lower abdominal pain and pain into legs.  Pt reports vomiting.  Pt reports body aches.  Pt states she did vomit some blood.  Pt thinks it feels a little like her sickle cell pain and now reports weakness, lightheaded and chest hurts

## 2013-12-05 NOTE — ED Notes (Signed)
Pt called x2 for ready room. No answer

## 2014-02-02 ENCOUNTER — Ambulatory Visit: Payer: Medicaid Other | Admitting: Obstetrics & Gynecology

## 2014-03-08 ENCOUNTER — Ambulatory Visit: Payer: Medicaid Other | Admitting: Obstetrics & Gynecology

## 2014-04-07 ENCOUNTER — Emergency Department (HOSPITAL_COMMUNITY): Payer: Medicaid Other

## 2014-04-07 ENCOUNTER — Encounter (HOSPITAL_COMMUNITY): Payer: Self-pay | Admitting: Emergency Medicine

## 2014-04-07 ENCOUNTER — Emergency Department (HOSPITAL_COMMUNITY)
Admission: EM | Admit: 2014-04-07 | Discharge: 2014-04-08 | Disposition: A | Discharge status: Home / Self Care | Payer: Medicaid Other | Attending: Emergency Medicine | Admitting: Emergency Medicine

## 2014-04-07 DIAGNOSIS — Z8742 Personal history of other diseases of the female genital tract: Secondary | ICD-10-CM | POA: Insufficient documentation

## 2014-04-07 DIAGNOSIS — I10 Essential (primary) hypertension: Secondary | ICD-10-CM | POA: Insufficient documentation

## 2014-04-07 DIAGNOSIS — Z8659 Personal history of other mental and behavioral disorders: Secondary | ICD-10-CM | POA: Insufficient documentation

## 2014-04-07 DIAGNOSIS — Z3202 Encounter for pregnancy test, result negative: Secondary | ICD-10-CM | POA: Insufficient documentation

## 2014-04-07 DIAGNOSIS — J45909 Unspecified asthma, uncomplicated: Secondary | ICD-10-CM | POA: Insufficient documentation

## 2014-04-07 DIAGNOSIS — Z79899 Other long term (current) drug therapy: Secondary | ICD-10-CM | POA: Insufficient documentation

## 2014-04-07 DIAGNOSIS — R071 Chest pain on breathing: Secondary | ICD-10-CM | POA: Insufficient documentation

## 2014-04-07 DIAGNOSIS — Z862 Personal history of diseases of the blood and blood-forming organs and certain disorders involving the immune mechanism: Secondary | ICD-10-CM | POA: Insufficient documentation

## 2014-04-07 DIAGNOSIS — Z87442 Personal history of urinary calculi: Secondary | ICD-10-CM | POA: Insufficient documentation

## 2014-04-07 DIAGNOSIS — R0789 Other chest pain: Secondary | ICD-10-CM

## 2014-04-07 DIAGNOSIS — Z91040 Latex allergy status: Secondary | ICD-10-CM | POA: Insufficient documentation

## 2014-04-07 LAB — BASIC METABOLIC PANEL
BUN: 11 mg/dL (ref 6–23)
CALCIUM: 9.6 mg/dL (ref 8.4–10.5)
CO2: 27 meq/L (ref 19–32)
Chloride: 98 mEq/L (ref 96–112)
Creatinine, Ser: 0.89 mg/dL (ref 0.50–1.10)
GFR calc Af Amer: >90 mL/min (ref >90)
GFR calc non Af Amer: 88 mL/min — ABNORMAL LOW (ref >90)
GLUCOSE: 133 mg/dL — AB (ref 70–99)
Potassium: 4 mEq/L (ref 3.7–5.3)
SODIUM: 138 meq/L (ref 137–147)

## 2014-04-07 LAB — CBC
HCT: 39.8 % (ref 36.0–46.0)
Hemoglobin: 13.5 g/dL (ref 12.0–15.0)
MCH: 26.9 pg (ref 26.0–34.0)
MCHC: 33.9 g/dL (ref 30.0–36.0)
MCV: 79.3 fL (ref 78.0–100.0)
PLATELETS: 267 10*3/uL (ref 150–400)
RBC: 5.02 MIL/uL (ref 3.87–5.11)
RDW: 13.2 % (ref 11.5–15.5)
WBC: 12.5 10*3/uL — ABNORMAL HIGH (ref 4.0–10.5)

## 2014-04-07 LAB — RETICULOCYTES
RBC.: 4.66 MIL/uL (ref 3.87–5.11)
Retic Count, Absolute: 74.6 10*3/uL (ref 19.0–186.0)
Retic Ct Pct: 1.6 % (ref 0.4–3.1)

## 2014-04-07 LAB — I-STAT TROPONIN, ED: TROPONIN I, POC: 0.01 ng/mL (ref 0.00–0.08)

## 2014-04-07 LAB — POC URINE PREG, ED: PREG TEST UR: NEGATIVE

## 2014-04-07 LAB — D-DIMER, QUANTITATIVE: D-Dimer, Quant: 0.83 ug/mL-FEU — ABNORMAL HIGH (ref 0.00–0.48)

## 2014-04-07 MED ORDER — KETOROLAC TROMETHAMINE 30 MG/ML IJ SOLN
30.0000 mg | Freq: Once | INTRAMUSCULAR | Status: AC
  Administered 2014-04-07: 30 mg via INTRAVENOUS
  Filled 2014-04-07: qty 1

## 2014-04-07 MED ORDER — NAPROXEN 500 MG PO TABS: 500.0000 mg | ORAL_TABLET | Freq: Two times a day (BID) | ORAL | Status: DC

## 2014-04-07 MED ORDER — CYCLOBENZAPRINE HCL 5 MG PO TABS: 5.0000 mg | ORAL_TABLET | Freq: Three times a day (TID) | ORAL | Status: DC | PRN

## 2014-04-07 MED ORDER — CYCLOBENZAPRINE HCL 10 MG PO TABS
10.0000 mg | ORAL_TABLET | Freq: Once | ORAL | Status: AC
  Administered 2014-04-07: 10 mg via ORAL
  Filled 2014-04-07: qty 1

## 2014-04-07 NOTE — ED Notes (Signed)
patient reports chest pain that started this morning. States she has generalized chest pain and severe back pain, states she is unable to reach behind her back with her arm due to pain. Patient states she has been unable to eat or drink d/t pain.

## 2014-04-07 NOTE — Discharge Instructions (Signed)
Ice and heat to the sore areas. Take the medications as prescribed. Return if you get a fever, cough or struggle to breathe.

## 2014-04-07 NOTE — ED Notes (Signed)
Pt is unable to urinate at this time. 

## 2014-04-07 NOTE — ED Provider Notes (Signed)
CSN: [id number]     Arrival date & time 04/07/14  2050 History   First MD Initiated Contact with Patient 04/07/14 2104     Chief Complaint  Patient presents with  . Chest Pain     (Consider location/radiation/quality/duration/timing/severity/associated sxs/prior Treatment) HPI Patient reports she started getting chest pain at noon today. She states the pain is in the center of her chest and radiates into her back. States the pain started slowly and got worse. She states she has a history of reactive airway disease and thought maybe she was having any asthma flare. She used her inhaler x3 and in nebulizer times one without relief. She states she had a cough mildly this morning but it's gone now. She has dyspnea on exertion. She denies fever, sore throat, or rhinorrhea. She states laying flat makes the pain feel worse, sitting up makes the pain feel better. She denies any recent travel. She states that she partied hard about a week ago because it was her birthday and got extremely intoxicated however she felt fine between then and now. She states she's never had this pain before. She denies any other change in activity. She states she has an IUD for dysfunctional uterine bleeding.  PCP Dr Jeanie Cooks  Past Medical History  Diagnosis Date  . Asthma   . Hypertension   . Ovarian cyst   . Chronic pelvic pain in female   . Kidney stones   . Schizophrenia   . Bipolar disorder   . Sickle cell trait   . Anemia   . DUB (dysfunctional uterine bleeding)   . Blood transfusion without reported diagnosis     pt has had more than 10 transfusions   Past Surgical History  Procedure Laterality Date  . Dilation and curettage of uterus    . Laparoscopy abdomen diagnostic N/A    Family History  Problem Relation Age of Onset  . Hypertension Mother   . Sickle cell trait Father    History  Substance Use Topics  . Smoking status: Never Smoker   . Smokeless tobacco: Never Used  . Alcohol Use: No   Pt  on disability for sickle cell disease  OB History   Grav Para Term Preterm Abortions TAB SAB Ect Mult Living   0              Review of Systems  All other systems reviewed and are negative.     Allergies  Latex  Home Medications   Prior to Admission medications   Medication Sig Start Date End Date Taking? Authorizing Provider  albuterol (PROVENTIL HFA;VENTOLIN HFA) 108 (90 BASE) MCG/ACT inhaler Inhale 2 puffs into the lungs every 6 (six) hours as needed. Shortness of breath   Yes Historical Provider, MD  ibuprofen (ADVIL,MOTRIN) 800 MG tablet Take 800 mg by mouth every 8 (eight) hours as needed for mild pain.   Yes Historical Provider, MD  Multiple Vitamin (MULTIVITAMIN) tablet Take 1 tablet by mouth daily.    Yes Historical Provider, MD  oxyCODONE-acetaminophen (PERCOCET) 5-325 MG per tablet Take 1 tablet by mouth every 6 (six) hours as needed. pain   Yes Historical Provider, MD   BP 117/71  Pulse 84  Temp(Src) 98.3 F (36.8 C) (Oral)  Resp 12  Ht 5\' 3"  (1.6 m)  Wt 220 lb (99.791 kg)  BMI 38.98 kg/m2  SpO2 98%  Vital signs normal   Physical Exam  Nursing note and vitals reviewed. Constitutional: She is oriented to person, place, and  time. She appears well-developed and well-nourished.  Non-toxic appearance. She does not appear ill. No distress.  HENT:  Head: Normocephalic and atraumatic.  Right Ear: External ear normal.  Left Ear: External ear normal.  Nose: Nose normal. No mucosal edema or rhinorrhea.  Mouth/Throat: Oropharynx is clear and moist and mucous membranes are normal. No dental abscesses or uvula swelling.  Eyes: Conjunctivae and EOM are normal. Pupils are equal, round, and reactive to light.  Neck: Normal range of motion and full passive range of motion without pain. Neck supple.  Cardiovascular: Normal rate, regular rhythm and normal heart sounds.  Exam reveals no gallop and no friction rub.   No murmur heard. Pulmonary/Chest: Effort normal and breath  sounds normal. No respiratory distress. She has no wheezes. She has no rhonchi. She has no rales. She exhibits tenderness. She exhibits no crepitus.    Abdominal: Soft. Normal appearance and bowel sounds are normal. She exhibits no distension. There is no tenderness. There is no rebound and no guarding.  Musculoskeletal: Normal range of motion. She exhibits tenderness. She exhibits no edema.       Back:  Moves all extremities well.   Neurological: She is alert and oriented to person, place, and time. She has normal strength. No cranial nerve deficit.  Skin: Skin is warm, dry and intact. No rash noted. No erythema. No pallor.  Psychiatric: She has a normal mood and affect. Her speech is normal and behavior is normal. Her mood appears not anxious.    ED Course  Procedures (including critical care time)  Medications  ketorolac (TORADOL) 30 MG/ML injection 30 mg (30 mg Intravenous Given 04/07/14 2213)  cyclobenzaprine (FLEXERIL) tablet 10 mg (10 mg Oral Given 04/07/14 2213)   Patient reports she has been very in the past 2 weeks more than usual. She recently moved and got her new living quarters set up, she also had the party last weekend for her birthday. She states her chest and her back hurts with movement. At this point my index of suspicion for PE is very low. When patient was initially seen she did not want any narcotic pain medication because "it knocks me out". She states her pain is improving with the IV Toradol and Flexeril. She feels good enough to go home.   Labs Review Results for orders placed during the hospital encounter of 04/07/14  CBC      Result Value Ref Range   WBC 12.5 (*) 4.0 - 10.5 K/uL   RBC 5.02  3.87 - 5.11 MIL/uL   Hemoglobin 13.5  12.0 - 15.0 g/dL   HCT 39.8  36.0 - 46.0 %   MCV 79.3  78.0 - 100.0 fL   MCH 26.9  26.0 - 34.0 pg   MCHC 33.9  30.0 - 36.0 g/dL   RDW 13.2  11.5 - 15.5 %   Platelets 267  150 - 400 K/uL  BASIC METABOLIC PANEL      Result Value  Ref Range   Sodium 138  137 - 147 mEq/L   Potassium 4.0  3.7 - 5.3 mEq/L   Chloride 98  96 - 112 mEq/L   CO2 27  19 - 32 mEq/L   Glucose, Bld 133 (*) 70 - 99 mg/dL   BUN 11  6 - 23 mg/dL   Creatinine, Ser 0.89  0.50 - 1.10 mg/dL   Calcium 9.6  8.4 - 10.5 mg/dL   GFR calc non Af Amer 88 (*) >90 mL/min   GFR  calc Af Amer >90  >90 mL/min  RETICULOCYTES      Result Value Ref Range   Retic Ct Pct 1.6  0.4 - 3.1 %   RBC. 4.66  3.87 - 5.11 MIL/uL   Retic Count, Manual 74.6  19.0 - 186.0 K/uL  D-DIMER, QUANTITATIVE      Result Value Ref Range   D-Dimer, Quant 0.83 (*) 0.00 - 0.48 ug/mL-FEU  I-STAT TROPOININ, ED      Result Value Ref Range   Troponin i, poc 0.01  0.00 - 0.08 ng/mL   Comment 3           POC URINE PREG, ED      Result Value Ref Range   Preg Test, Ur NEGATIVE  NEGATIVE   Laboratory interpretation all normal except leukocytosis, elevated ddimer (also elevated in Dec 2013 and had a negative CT angio chest).     Imaging Review Dg Chest 2 View  04/07/2014   CLINICAL DATA:  Chest pain, shortness of breath, congestion for 1 day, history hypertension, asthma, sickle cell trait, kidney stones 11/29/2012  EXAM: CHEST  2 VIEW  COMPARISON:  None.  FINDINGS: Normal heart size, mediastinal contours, and pulmonary vascularity.  Lungs clear.  No pneumothorax.  Bones unremarkable.  IMPRESSION: Normal exam.   Electronically Signed   By: Lavonia Dana M.D.   On: 04/07/2014 23:13     EKG Interpretation   Date/Time:  Saturday April 07 2014 20:57:31 EDT Ventricular Rate:  87 PR Interval:  154 QRS Duration: 83 QT Interval:  376 QTC Calculation: 452 R Axis:   77 Text Interpretation:  Sinus rhythm Nonspecific T wave abnormality  Otherwise within normal limits No significant change since last tracing 05 Dec 2013 Confirmed by Lee-Ann Gal  MD-I, Kamarii Buren ([id number]) on 04/07/2014 10:46:44 PM      MDM   Final diagnoses:  Chest wall pain    New Prescriptions   CYCLOBENZAPRINE (FLEXERIL) 5 MG  TABLET    Take 1 tablet (5 mg total) by mouth 3 (three) times daily as needed for muscle spasms.   NAPROXEN (NAPROSYN) 500 MG TABLET    Take 1 tablet (500 mg total) by mouth 2 (two) times daily.    Plan discharge  Rolland Porter, MD, Alanson Aly, MD 04/08/14 [phone]

## 2014-08-15 ENCOUNTER — Ambulatory Visit: Payer: Medicaid Other | Admitting: Obstetrics & Gynecology

## 2014-08-18 ENCOUNTER — Encounter (HOSPITAL_COMMUNITY): Payer: Self-pay | Admitting: Emergency Medicine

## 2014-08-18 ENCOUNTER — Emergency Department (INDEPENDENT_AMBULATORY_CARE_PROVIDER_SITE_OTHER)
Admission: EM | Admit: 2014-08-18 | Discharge: 2014-08-18 | Disposition: A | Discharge status: Home / Self Care | Payer: Medicaid Other | Source: Home / Self Care

## 2014-08-18 DIAGNOSIS — IMO0001 Reserved for inherently not codable concepts without codable children: Secondary | ICD-10-CM

## 2014-08-18 DIAGNOSIS — L03019 Cellulitis of unspecified finger: Secondary | ICD-10-CM

## 2014-08-18 MED ORDER — CEPHALEXIN 500 MG PO CAPS: 500.0000 mg | ORAL_CAPSULE | Freq: Four times a day (QID) | ORAL | Status: DC

## 2014-08-18 MED ORDER — HYDROCODONE-ACETAMINOPHEN 7.5-325 MG PO TABS: 1.0000 | ORAL_TABLET | ORAL | Status: DC | PRN

## 2014-08-18 MED ORDER — IBUPROFEN 800 MG PO TABS
800.0000 mg | ORAL_TABLET | Freq: Once | ORAL | Status: AC
  Administered 2014-08-18: 800 mg via ORAL

## 2014-08-18 MED ORDER — IBUPROFEN 800 MG PO TABS
ORAL_TABLET | ORAL | Status: AC
  Filled 2014-08-18: qty 1

## 2014-08-18 NOTE — ED Provider Notes (Signed)
Medical screening examination/treatment/procedure(s) were performed by resident physician or non-physician practitioner and as supervising physician I was immediately available for consultation/collaboration.   Pauline Good MD.   Billy Fischer, MD 08/18/14 [phone]

## 2014-08-18 NOTE — Discharge Instructions (Signed)

## 2014-08-18 NOTE — ED Notes (Signed)
C/o pain on right 3 digit onset yest Has swelling around nailbed; reports she tried to "pop" it Tender; 8/10 Alert, no signs of acute distress.

## 2014-08-18 NOTE — ED Provider Notes (Signed)
CSN: [id number]     Arrival date & time 08/18/14  1814 History   First MD Initiated Contact with Patient 08/18/14 1858     Chief Complaint  Patient presents with  . Hand Pain   (Consider location/radiation/quality/duration/timing/severity/associated sxs/prior Treatment) HPI Comments: Picking at the nail of the right 3rd finger recently. Became sore and tender yesterday with green drainage.   Past Medical History  Diagnosis Date  . Asthma   . Hypertension   . Ovarian cyst   . Chronic pelvic pain in female   . Kidney stones   . Schizophrenia   . Bipolar disorder   . Sickle cell trait   . Anemia   . DUB (dysfunctional uterine bleeding)   . Blood transfusion without reported diagnosis     pt has had more than 10 transfusions   Past Surgical History  Procedure Laterality Date  . Dilation and curettage of uterus    . Laparoscopy abdomen diagnostic N/A    Family History  Problem Relation Age of Onset  . Hypertension Mother   . Sickle cell trait Father    History  Substance Use Topics  . Smoking status: Never Smoker   . Smokeless tobacco: Never Used  . Alcohol Use: No   OB History   Grav Para Term Preterm Abortions TAB SAB Ect Mult Living   0              Review of Systems  Constitutional: Negative.   Skin: Negative for rash.       As per HPI  All other systems reviewed and are negative.   Allergies  Latex  Home Medications   Prior to Admission medications   Medication Sig Start Date End Date Taking? Authorizing Provider  albuterol (PROVENTIL HFA;VENTOLIN HFA) 108 (90 BASE) MCG/ACT inhaler Inhale 2 puffs into the lungs every 6 (six) hours as needed. Shortness of breath    Historical Provider, MD  cephALEXin (KEFLEX) 500 MG capsule Take 1 capsule (500 mg total) by mouth 4 (four) times daily. 08/18/14   Janne Napoleon, NP  cyclobenzaprine (FLEXERIL) 5 MG tablet Take 1 tablet (5 mg total) by mouth 3 (three) times daily as needed for muscle spasms. 04/07/14   Janice Norrie,  MD  HYDROcodone-acetaminophen (NORCO) 7.5-325 MG per tablet Take 1 tablet by mouth every 4 (four) hours as needed. 08/18/14   Janne Napoleon, NP  ibuprofen (ADVIL,MOTRIN) 800 MG tablet Take 800 mg by mouth every 8 (eight) hours as needed for mild pain.    Historical Provider, MD  Multiple Vitamin (MULTIVITAMIN) tablet Take 1 tablet by mouth daily.     Historical Provider, MD  naproxen (NAPROSYN) 500 MG tablet Take 1 tablet (500 mg total) by mouth 2 (two) times daily. 04/07/14   Janice Norrie, MD  oxyCODONE-acetaminophen (PERCOCET) 5-325 MG per tablet Take 1 tablet by mouth every 6 (six) hours as needed. pain    Historical Provider, MD   BP 128/96  Pulse 91  Temp(Src) 99.2 F (37.3 C) (Oral)  SpO2 96% Physical Exam  Nursing note and vitals reviewed. Constitutional: She is oriented to person, place, and time. She appears well-developed and well-nourished. No distress.  Pulmonary/Chest: Effort normal. No respiratory distress.  Musculoskeletal: She exhibits no edema.  Tenderness to distal phalynx of the right third finger. Swelling, redness and tenderness around the base of the nail. No current drainage. Flex and ext of digit full, intact. No joint tenderness or swelling.  Neurological: She is alert and oriented  to person, place, and time. She exhibits normal muscle tone.  Skin: Skin is warm and dry. No rash noted.    ED Course  Drain paronychia Date/Time: 08/18/2014 7:20 PM Performed by: Marcha Dutton Lamija Besse Authorized by: Ihor Gully D Consent: Verbal consent obtained. Risks and benefits: risks, benefits and alternatives were discussed Consent given by: patient Patient understanding: patient states understanding of the procedure being performed Patient identity confirmed: verbally with patient Preparation: Patient was prepped and draped in the usual sterile fashion. Local anesthesia used: yes Patient sedated: no Patient tolerance: Patient tolerated the procedure well with no immediate  complications. Comments: Betadine. Freeze spray. Tangential puncture to the the paronychial swelling with #11 blade. Moderate amount of purulent exudate expressed.    (including critical care time) Labs Review Labs Reviewed - No data to display  Imaging Review No results found.   MDM   1. Paronychia of third toe, right     I and D Keflex as dir norco 7.5 mg #10 Ibuprofen 800 mg po now Warm soaks tonight. Return if not improving.       Janne Napoleon, NP 08/18/14 Curly [place]

## 2014-08-27 ENCOUNTER — Encounter (HOSPITAL_COMMUNITY): Payer: Self-pay | Admitting: Emergency Medicine

## 2014-08-27 ENCOUNTER — Emergency Department (INDEPENDENT_AMBULATORY_CARE_PROVIDER_SITE_OTHER)
Admission: EM | Admit: 2014-08-27 | Discharge: 2014-08-27 | Disposition: A | Discharge status: Home / Self Care | Payer: Medicaid Other | Source: Home / Self Care

## 2014-08-27 DIAGNOSIS — B3731 Acute candidiasis of vulva and vagina: Secondary | ICD-10-CM

## 2014-08-27 DIAGNOSIS — B373 Candidiasis of vulva and vagina: Secondary | ICD-10-CM

## 2014-08-27 LAB — POCT URINALYSIS DIP (DEVICE)
BILIRUBIN URINE: NEGATIVE
GLUCOSE, UA: NEGATIVE mg/dL
Hgb urine dipstick: NEGATIVE
KETONES UR: NEGATIVE mg/dL
NITRITE: NEGATIVE
PH: 6.5 (ref 5.0–8.0)
Protein, ur: NEGATIVE mg/dL
Specific Gravity, Urine: 1.015 (ref 1.005–1.030)
Urobilinogen, UA: 1 mg/dL (ref 0.0–1.0)

## 2014-08-27 MED ORDER — FLUCONAZOLE 150 MG PO TABS: 150.0000 mg | ORAL_TABLET | Freq: Once | ORAL | Status: DC

## 2014-08-27 MED ORDER — TERCONAZOLE 80 MG VA SUPP
80.0000 mg | Freq: Every day | VAGINAL | Status: DC
Start: 2014-08-27 — End: 2015-02-20

## 2014-08-27 NOTE — ED Notes (Signed)
Concern for poss UTI

## 2014-08-27 NOTE — ED Provider Notes (Signed)
CSN: [id number]     Arrival date & time 08/27/14  1722 History   None    No chief complaint on file.  (Consider location/radiation/quality/duration/timing/severity/associated sxs/prior Treatment) Patient is a 28 y.o. female presenting with dysuria. The history is provided by the patient.  Dysuria Pain quality:  Burning Pain severity:  Mild Onset quality:  Gradual Duration:  2 days Chronicity:  New (s/p keflex for finger infection seen 9/5, . sl itching.) Recent urinary tract infections: no   Relieved by:  None tried Worsened by:  Nothing tried Urinary symptoms: no foul-smelling urine   Associated symptoms: vaginal discharge   Associated symptoms: no fever and no flank pain   Risk factors: no recurrent urinary tract infections and no sexually transmitted infections     Past Medical History  Diagnosis Date  . Asthma   . Hypertension   . Ovarian cyst   . Chronic pelvic pain in female   . Kidney stones   . Schizophrenia   . Bipolar disorder   . Sickle cell trait   . Anemia   . DUB (dysfunctional uterine bleeding)   . Blood transfusion without reported diagnosis     pt has had more than 10 transfusions   Past Surgical History  Procedure Laterality Date  . Dilation and curettage of uterus    . Laparoscopy abdomen diagnostic N/A    Family History  Problem Relation Age of Onset  . Hypertension Mother   . Sickle cell trait Father    History  Substance Use Topics  . Smoking status: Never Smoker   . Smokeless tobacco: Never Used  . Alcohol Use: No   OB History   Grav Para Term Preterm Abortions TAB SAB Ect Mult Living   0              Review of Systems  Constitutional: Negative.  Negative for fever.  Gastrointestinal: Negative.   Genitourinary: Positive for dysuria and vaginal discharge. Negative for flank pain, vaginal bleeding, menstrual problem and pelvic pain.    Allergies  Latex  Home Medications   Prior to Admission medications   Medication Sig Start  Date End Date Taking? Authorizing Provider  albuterol (PROVENTIL HFA;VENTOLIN HFA) 108 (90 BASE) MCG/ACT inhaler Inhale 2 puffs into the lungs every 6 (six) hours as needed. Shortness of breath    Historical Provider, MD  cephALEXin (KEFLEX) 500 MG capsule Take 1 capsule (500 mg total) by mouth 4 (four) times daily. 08/18/14   Janne Napoleon, NP  cyclobenzaprine (FLEXERIL) 5 MG tablet Take 1 tablet (5 mg total) by mouth 3 (three) times daily as needed for muscle spasms. 04/07/14   Janice Norrie, MD  fluconazole (DIFLUCAN) 150 MG tablet Take 1 tablet (150 mg total) by mouth once. Repeat in 1 week if needed. 08/27/14   Billy Fischer, MD  HYDROcodone-acetaminophen ([place]) 7.5-325 MG per tablet Take 1 tablet by mouth every 4 (four) hours as needed. 08/18/14   Janne Napoleon, NP  ibuprofen (ADVIL,MOTRIN) 800 MG tablet Take 800 mg by mouth every 8 (eight) hours as needed for mild pain.    Historical Provider, MD  Multiple Vitamin (MULTIVITAMIN) tablet Take 1 tablet by mouth daily.     Historical Provider, MD  naproxen (NAPROSYN) 500 MG tablet Take 1 tablet (500 mg total) by mouth 2 (two) times daily. 04/07/14   Janice Norrie, MD  oxyCODONE-acetaminophen (PERCOCET) 5-325 MG per tablet Take 1 tablet by mouth every 6 (six) hours as needed. pain  Historical Provider, MD  terconazole (TERAZOL 3) 80 MG vaginal suppository Place 1 suppository (80 mg total) vaginally at bedtime. 08/27/14   Billy Fischer, MD   BP 118/81  Pulse 78  Temp(Src) 98.1 F (36.7 C) (Oral)  Resp 18  SpO2 100% Physical Exam  Nursing note and vitals reviewed. Constitutional: She is oriented to person, place, and time. She appears well-developed and well-nourished.  Abdominal: There is no tenderness.  Neurological: She is alert and oriented to person, place, and time.  Skin: Skin is warm and dry.    ED Course  Procedures (including critical care time) Labs Review Labs Reviewed  POCT URINALYSIS DIP (DEVICE) - Abnormal; Notable for the following:     Leukocytes, UA TRACE (*)    All other components within normal limits    Imaging Review No results found.   MDM   1. Candidal vaginitis        Billy Fischer, MD 08/27/14 [phone]

## 2014-09-20 ENCOUNTER — Ambulatory Visit: Payer: Medicaid Other | Admitting: Obstetrics & Gynecology

## 2015-02-05 ENCOUNTER — Encounter (HOSPITAL_COMMUNITY): Payer: Self-pay | Admitting: Emergency Medicine

## 2015-02-05 ENCOUNTER — Emergency Department (INDEPENDENT_AMBULATORY_CARE_PROVIDER_SITE_OTHER): Payer: Medicaid Other

## 2015-02-05 ENCOUNTER — Emergency Department (HOSPITAL_COMMUNITY)
Admission: EM | Admit: 2015-02-05 | Discharge: 2015-02-05 | Disposition: A | Discharge status: Home / Self Care | Payer: Medicaid Other | Source: Home / Self Care | Attending: Family Medicine | Admitting: Family Medicine

## 2015-02-05 DIAGNOSIS — J111 Influenza due to unidentified influenza virus with other respiratory manifestations: Secondary | ICD-10-CM | POA: Diagnosis not present

## 2015-02-05 DIAGNOSIS — R69 Illness, unspecified: Principal | ICD-10-CM

## 2015-02-05 LAB — CBC WITH DIFFERENTIAL/PLATELET
BASOS ABS: 0 10*3/uL (ref 0.0–0.1)
BASOS PCT: 0 % (ref 0–1)
EOS ABS: 0.2 10*3/uL (ref 0.0–0.7)
EOS PCT: 4 % (ref 0–5)
HCT: 37.5 % (ref 36.0–46.0)
Hemoglobin: 12.5 g/dL (ref 12.0–15.0)
LYMPHS ABS: 1.2 10*3/uL (ref 0.7–4.0)
LYMPHS PCT: 30 % (ref 12–46)
MCH: 26.3 pg (ref 26.0–34.0)
MCHC: 33.3 g/dL (ref 30.0–36.0)
MCV: 78.9 fL (ref 78.0–100.0)
Monocytes Absolute: 0.6 10*3/uL (ref 0.1–1.0)
Monocytes Relative: 14 % — ABNORMAL HIGH (ref 3–12)
NEUTROS PCT: 52 % (ref 43–77)
Neutro Abs: 2.1 10*3/uL (ref 1.7–7.7)
PLATELETS: 214 10*3/uL (ref 150–400)
RBC: 4.75 MIL/uL (ref 3.87–5.11)
RDW: 12.9 % (ref 11.5–15.5)
WBC: 4 10*3/uL (ref 4.0–10.5)

## 2015-02-05 LAB — POCT I-STAT, CHEM 8
BUN: 6 mg/dL (ref 6–23)
CALCIUM ION: 1.12 mmol/L (ref 1.12–1.23)
CREATININE: 0.9 mg/dL (ref 0.50–1.10)
Chloride: 99 mmol/L (ref 96–112)
Glucose, Bld: 101 mg/dL — ABNORMAL HIGH (ref 70–99)
HEMATOCRIT: 44 % (ref 36.0–46.0)
HEMOGLOBIN: 15 g/dL (ref 12.0–15.0)
Potassium: 3.6 mmol/L (ref 3.5–5.1)
Sodium: 140 mmol/L (ref 135–145)
TCO2: 25 mmol/L (ref 0–100)

## 2015-02-05 LAB — POCT RAPID STREP A: Streptococcus, Group A Screen (Direct): NEGATIVE

## 2015-02-05 MED ORDER — OSELTAMIVIR PHOSPHATE 75 MG PO CAPS: 75.0000 mg | ORAL_CAPSULE | Freq: Two times a day (BID) | ORAL | Status: DC

## 2015-02-05 NOTE — ED Provider Notes (Signed)
Kathryn Suarez is a 29 y.o. female who presents to Urgent Care today for cough fevers and bodyaches congestion. Patient had one episode of blood tinge vomiting yesterday. She denies any significant shortness of breath. She also notes some sore throat. She has a history of sickle cell trait currently managed at [facility]. She takes Percocet for pain control.   Past Medical History  Diagnosis Date  . Asthma   . Hypertension   . Ovarian cyst   . Chronic pelvic pain in female   . Kidney stones   . Schizophrenia   . Bipolar disorder   . Sickle cell trait   . Anemia   . DUB (dysfunctional uterine bleeding)   . Blood transfusion without reported diagnosis     pt has had more than 10 transfusions   Past Surgical History  Procedure Laterality Date  . Dilation and curettage of uterus    . Laparoscopy abdomen diagnostic N/A    History  Substance Use Topics  . Smoking status: Never Smoker   . Smokeless tobacco: Never Used  . Alcohol Use: No   ROS as above Medications: No current facility-administered medications for this encounter.   Current Outpatient Prescriptions  Medication Sig Dispense Refill  . albuterol (PROVENTIL HFA;VENTOLIN HFA) 108 (90 BASE) MCG/ACT inhaler Inhale 2 puffs into the lungs every 6 (six) hours as needed. Shortness of breath    . cyclobenzaprine (FLEXERIL) 5 MG tablet Take 1 tablet (5 mg total) by mouth 3 (three) times daily as needed for muscle spasms. 30 tablet 0  . fluconazole (DIFLUCAN) 150 MG tablet Take 1 tablet (150 mg total) by mouth once. Repeat in 1 week if needed. 1 tablet 1  . HYDROcodone-acetaminophen (NORCO) 7.5-325 MG per tablet Take 1 tablet by mouth every 4 (four) hours as needed. 10 tablet 0  . ibuprofen (ADVIL,MOTRIN) 800 MG tablet Take 800 mg by mouth every 8 (eight) hours as needed for mild pain.    . Multiple Vitamin (MULTIVITAMIN) tablet Take 1 tablet by mouth daily.     . naproxen (NAPROSYN) 500 MG tablet Take 1 tablet (500 mg total) by mouth 2  (two) times daily. 30 tablet 0  . oseltamivir (TAMIFLU) 75 MG capsule Take 1 capsule (75 mg total) by mouth every 12 (twelve) hours. 10 capsule 0  . oxyCODONE-acetaminophen (PERCOCET) 5-325 MG per tablet Take 1 tablet by mouth every 6 (six) hours as needed. pain    . terconazole (TERAZOL 3) 80 MG vaginal suppository Place 1 suppository (80 mg total) vaginally at bedtime. 3 suppository 0   Allergies  Allergen Reactions  . Latex Rash     Exam:  BP 121/81 mmHg  Pulse 99  Temp(Src) 98.2 F (36.8 C) (Oral)  Resp 16  SpO2 94% Gen: Well NAD HEENT: EOMI,  MMM posterior pharynx is mildly erythematous normal tympanic membranes bilaterally Lungs: Normal work of breathing. CTABL Heart: RRR no MRG Abd: NABS, Soft. Nondistended, Nontender Exts: Brisk capillary refill, warm and well perfused.   Results for orders placed or performed during the hospital encounter of 02/05/15 (from the past 24 hour(s))  CBC with Differential     Status: Abnormal   Collection Time: 02/05/15  8:53 AM  Result Value Ref Range   WBC 4.0 4.0 - 10.5 K/uL   RBC 4.75 3.87 - 5.11 MIL/uL   Hemoglobin 12.5 12.0 - 15.0 g/dL   HCT 37.5 36.0 - 46.0 %   MCV 78.9 78.0 - 100.0 fL   MCH 26.3 26.0 -  34.0 pg   MCHC 33.3 30.0 - 36.0 g/dL   RDW 12.9 11.5 - 15.5 %   Platelets 214 150 - 400 K/uL   Neutrophils Relative % 52 43 - 77 %   Neutro Abs 2.1 1.7 - 7.7 K/uL   Lymphocytes Relative 30 12 - 46 %   Lymphs Abs 1.2 0.7 - 4.0 K/uL   Monocytes Relative 14 (H) 3 - 12 %   Monocytes Absolute 0.6 0.1 - 1.0 K/uL   Eosinophils Relative 4 0 - 5 %   Eosinophils Absolute 0.2 0.0 - 0.7 K/uL   Basophils Relative 0 0 - 1 %   Basophils Absolute 0.0 0.0 - 0.1 K/uL  I-STAT, chem 8     Status: Abnormal   Collection Time: 02/05/15  9:13 AM  Result Value Ref Range   Sodium 140 135 - 145 mmol/L   Potassium 3.6 3.5 - 5.1 mmol/L   Chloride 99 96 - 112 mmol/L   BUN 6 6 - 23 mg/dL   Creatinine, Ser 0.90 0.50 - 1.10 mg/dL   Glucose, Bld 101  (H) 70 - 99 mg/dL   Calcium, Ion 1.12 1.12 - 1.23 mmol/L   TCO2 25 0 - 100 mmol/L   Hemoglobin 15.0 12.0 - 15.0 g/dL   HCT 44.0 36.0 - 46.0 %  POCT rapid strep A [facility])     Status: None   Collection Time: 02/05/15  9:57 AM  Result Value Ref Range   Streptococcus, Group A Screen (Direct) NEGATIVE NEGATIVE   Dg Chest 2 View  02/05/2015   CLINICAL DATA:  Cough and central chest pain  EXAM: CHEST  2 VIEW  COMPARISON:  04/07/2014  FINDINGS: The heart size and mediastinal contours are within normal limits. Both lungs are clear. The visualized skeletal structures are unremarkable.  IMPRESSION: No active cardiopulmonary disease.   Electronically Signed   By: Inez Catalina M.D.   On: 02/05/2015 09:56    Assessment and Plan: 29 y.o. female with influenza-like illness. Treat with Tamiflu. Tylenol or ibuprofen for pain. Work note provided. Follow-up with PCP.  Discussed warning signs or symptoms. Please see discharge instructions. Patient expresses understanding.     Gregor Hams, MD 02/05/15 1037

## 2015-02-05 NOTE — ED Notes (Signed)
Onset Sunday of symptoms: body aches, sore throat, runny nose, cough,  nausea, vomiting (no vomiting today).

## 2015-02-05 NOTE — Discharge Instructions (Signed)
Thank you for coming in today. Take tamiflu as directed.  Use tylenol or ibuprofen for pain.    Influenza Influenza ("the flu") is a viral infection of the respiratory tract. It occurs more often in winter months because people spend more time in close contact with one another. Influenza can make you feel very sick. Influenza easily spreads from person to person (contagious). CAUSES  Influenza is caused by a virus that infects the respiratory tract. You can catch the virus by breathing in droplets from an infected person's cough or sneeze. You can also catch the virus by touching something that was recently contaminated with the virus and then touching your mouth, nose, or eyes. RISKS AND COMPLICATIONS You may be at risk for a more severe case of influenza if you smoke cigarettes, have diabetes, have chronic heart disease (such as heart failure) or lung disease (such as asthma), or if you have a weakened immune system. Elderly people and pregnant women are also at risk for more serious infections. The most common problem of influenza is a lung infection (pneumonia). Sometimes, this problem can require emergency medical care and may be life threatening. SIGNS AND SYMPTOMS  Symptoms typically last 4 to 10 days and may include:  Fever.  Chills.  Headache, body aches, and muscle aches.  Sore throat.  Chest discomfort and cough.  Poor appetite.  Weakness or feeling tired.  Dizziness.  Nausea or vomiting. DIAGNOSIS  Diagnosis of influenza is often made based on your history and a physical exam. A nose or throat swab test can be done to confirm the diagnosis. TREATMENT  In mild cases, influenza goes away on its own. Treatment is directed at relieving symptoms. For more severe cases, your health care provider may prescribe antiviral medicines to shorten the sickness. Antibiotic medicines are not effective because the infection is caused by a virus, not by bacteria. HOME CARE  INSTRUCTIONS  Take medicines only as directed by your health care provider.  Use a cool mist humidifier to make breathing easier.  Get plenty of rest until your temperature returns to normal. This usually takes 3 to 4 days.  Drink enough fluid to keep your urine clear or pale yellow.  Cover yourmouth and nosewhen coughing or sneezing,and wash your handswellto prevent thevirusfrom spreading.  Stay homefromwork orschool untilthe fever is gonefor at least 68full day. PREVENTION  An annual influenza vaccination (flu shot) is the best way to avoid getting influenza. An annual flu shot is now routinely recommended for all adults in the [place] IF:  You experiencechest pain, yourcough worsens,or you producemore mucus.  Youhave nausea,vomiting, ordiarrhea.  Your fever returns or gets worse. SEEK IMMEDIATE MEDICAL CARE IF:  You havetrouble breathing, you become short of breath,or your skin ornails becomebluish.  You have severe painor stiffnessin the neck.  You develop a sudden headache, or pain in the face or ear.  You have nausea or vomiting that you cannot control. MAKE SURE YOU:   Understand these instructions.  Will watch your condition.  Will get help right away if you are not doing well or get worse. Document Released: 11/27/2000 Document Revised: 04/16/2014 Document Reviewed: 02/29/2012 [facility] Patient Information 2015 [place], [place]. This information is not intended to replace advice given to you by your health care provider. Make sure you discuss any questions you have with your health care provider.

## 2015-02-08 LAB — CULTURE, GROUP A STREP: Strep A Culture: NEGATIVE

## 2015-02-20 ENCOUNTER — Ambulatory Visit (INDEPENDENT_AMBULATORY_CARE_PROVIDER_SITE_OTHER): Payer: Medicaid Other | Admitting: Obstetrics & Gynecology

## 2015-02-20 ENCOUNTER — Encounter: Payer: Self-pay | Admitting: Obstetrics & Gynecology

## 2015-02-20 ENCOUNTER — Other Ambulatory Visit (HOSPITAL_COMMUNITY)
Admission: RE | Admit: 2015-02-20 | Discharge: 2015-02-20 | Disposition: A | Discharge status: Home / Self Care | Payer: Medicaid Other | Source: Ambulatory Visit | Attending: Obstetrics & Gynecology | Admitting: Obstetrics & Gynecology

## 2015-02-20 VITALS — BP 119/77 | HR 96 | Temp 98.6°F | Ht 63.0 in | Wt 240.7 lb

## 2015-02-20 DIAGNOSIS — Z01419 Encounter for gynecological examination (general) (routine) without abnormal findings: Secondary | ICD-10-CM | POA: Insufficient documentation

## 2015-02-20 DIAGNOSIS — G8929 Other chronic pain: Secondary | ICD-10-CM | POA: Diagnosis not present

## 2015-02-20 DIAGNOSIS — Z124 Encounter for screening for malignant neoplasm of cervix: Secondary | ICD-10-CM | POA: Diagnosis not present

## 2015-02-20 DIAGNOSIS — N949 Unspecified condition associated with female genital organs and menstrual cycle: Secondary | ICD-10-CM

## 2015-02-20 DIAGNOSIS — N898 Other specified noninflammatory disorders of vagina: Secondary | ICD-10-CM

## 2015-02-20 DIAGNOSIS — R102 Pelvic and perineal pain: Principal | ICD-10-CM

## 2015-02-20 NOTE — Progress Notes (Signed)
Patient ID: Kathryn Suarez, female   DOB: 1986-09-04, 29 y.o.   MRN: [id number]  Chief Complaint  Patient presents with  . Pelvic Pain    for past month  . Vaginal Discharge    no odor no itch    HPI Kathryn Suarez is a 29 y.o. female.  G0P0 No LMP recorded. Patient is not currently having periods (Reason: IUD). Chronic pain is controlled. Vaginal discharge no itch or odor. No bleeding  HPI  Past Medical History  Diagnosis Date  . Asthma   . Hypertension   . Ovarian cyst   . Chronic pelvic pain in female   . Kidney stones   . Schizophrenia   . Bipolar disorder   . Sickle cell trait   . Anemia   . DUB (dysfunctional uterine bleeding)   . Blood transfusion without reported diagnosis     pt has had more than 10 transfusions    Past Surgical History  Procedure Laterality Date  . Dilation and curettage of uterus    . Laparoscopy abdomen diagnostic N/A     Family History  Problem Relation Age of Onset  . Hypertension Mother   . Sickle cell trait Father     Social History History  Substance Use Topics  . Smoking status: Never Smoker   . Smokeless tobacco: Never Used  . Alcohol Use: No    Allergies  Allergen Reactions  . Latex Rash    Current Outpatient Prescriptions  Medication Sig Dispense Refill  . albuterol (PROVENTIL HFA;VENTOLIN HFA) 108 (90 BASE) MCG/ACT inhaler Inhale 2 puffs into the lungs every 6 (six) hours as needed. Shortness of breath    . cyclobenzaprine (FLEXERIL) 5 MG tablet Take 1 tablet (5 mg total) by mouth 3 (three) times daily as needed for muscle spasms. 30 tablet 0  . oxyCODONE-acetaminophen (PERCOCET) 5-325 MG per tablet Take 1 tablet by mouth every 6 (six) hours as needed. pain     No current facility-administered medications for this visit.    Review of Systems Review of Systems  Constitutional: Negative.   Genitourinary: Positive for vaginal discharge and pelvic pain. Negative for menstrual problem.    Blood pressure  119/77, pulse 96, temperature 98.6 F (37 C), temperature source Oral, height 5\' 3"  (1.6 m), weight 240 lb 11.2 oz (109.181 kg).  Physical Exam Physical Exam  Constitutional: She is oriented to person, place, and time. She appears well-developed. No distress.  Pulmonary/Chest: Effort normal. No respiratory distress.  Abdominal: Soft.  Genitourinary: Uterus normal. Vaginal discharge found.  No mass or tenderness. Pap done  Neurological: She is alert and oriented to person, place, and time.  Skin: Skin is warm and dry.  Psychiatric: She has a normal mood and affect. Her behavior is normal.    Data Reviewed Pap 01/2012 nl  Assessment    Vaginal discharge awaiting dx by wet prep     Plan    Pap and wet prep result f/u        ARNOLD,JAMES 02/20/2015, 3:19 PM

## 2015-02-21 LAB — WET PREP, GENITAL: Trich, Wet Prep: NONE SEEN

## 2015-02-22 LAB — CYTOLOGY - PAP

## 2015-06-25 ENCOUNTER — Telehealth: Payer: Self-pay | Admitting: *Deleted

## 2015-06-25 NOTE — Telephone Encounter (Signed)
Pt left message stating that she was referred to a pain doctor by our clinic. She is having problems there and wants to switch to another doctor to manage her pain. She states that she was told she would have to contact our office in order to switch. Please call back.

## 2015-06-26 NOTE — Telephone Encounter (Signed)
Patient left message requesting call back. Called patient back. She stated that she was referred to pain management by [place] and is not satisfied with the provider she is seeing. Would like to be switched [facility], [phone]. I called [facility] and they stated that they will fax [place] a form explaining what they need and the dr will review her records and let us know if they can see her.  Patient has [place] access so we are unable to refer to pain clinic. Advised patient to call [facility] for referral.

## 2015-08-22 ENCOUNTER — Ambulatory Visit: Payer: Medicaid Other | Admitting: Obstetrics & Gynecology

## 2015-09-04 ENCOUNTER — Ambulatory Visit: Payer: Medicaid Other | Admitting: Obstetrics & Gynecology

## 2015-10-05 ENCOUNTER — Inpatient Hospital Stay (HOSPITAL_COMMUNITY)
Admission: AD | Admit: 2015-10-05 | Discharge: 2015-10-06 | Disposition: A | Discharge status: Home / Self Care | Payer: Medicaid Other | Source: Ambulatory Visit | Attending: Family Medicine | Admitting: Family Medicine

## 2015-10-05 ENCOUNTER — Encounter (HOSPITAL_COMMUNITY): Payer: Self-pay

## 2015-10-05 DIAGNOSIS — Z87442 Personal history of urinary calculi: Secondary | ICD-10-CM | POA: Insufficient documentation

## 2015-10-05 DIAGNOSIS — R350 Frequency of micturition: Secondary | ICD-10-CM | POA: Diagnosis not present

## 2015-10-05 DIAGNOSIS — R300 Dysuria: Secondary | ICD-10-CM | POA: Diagnosis not present

## 2015-10-05 DIAGNOSIS — Z3202 Encounter for pregnancy test, result negative: Secondary | ICD-10-CM | POA: Insufficient documentation

## 2015-10-05 DIAGNOSIS — R109 Unspecified abdominal pain: Secondary | ICD-10-CM | POA: Diagnosis present

## 2015-10-05 LAB — URINE MICROSCOPIC-ADD ON

## 2015-10-05 LAB — URINALYSIS, ROUTINE W REFLEX MICROSCOPIC
BILIRUBIN URINE: NEGATIVE
GLUCOSE, UA: NEGATIVE mg/dL
KETONES UR: NEGATIVE mg/dL
LEUKOCYTES UA: NEGATIVE
Nitrite: NEGATIVE
PH: 6 (ref 5.0–8.0)
Protein, ur: NEGATIVE mg/dL
Specific Gravity, Urine: 1.02 (ref 1.005–1.030)
Urobilinogen, UA: 0.2 mg/dL (ref 0.0–1.0)

## 2015-10-05 LAB — POCT PREGNANCY, URINE: Preg Test, Ur: NEGATIVE

## 2015-10-05 NOTE — MAU Note (Signed)
Pt states that she is having some lower abdominal pain that she rates 8/10-with urination, does not hurt otherwise. Having an increase in frequency and able to only urinate small amounts. Pt has IUD-denies vaginal discharge or bleeding. Denies fever, N/V/D.

## 2015-10-06 DIAGNOSIS — R350 Frequency of micturition: Secondary | ICD-10-CM

## 2015-10-06 MED ORDER — PHENAZOPYRIDINE HCL 200 MG PO TABS: 200.0000 mg | ORAL_TABLET | Freq: Three times a day (TID) | ORAL | Status: DC

## 2015-10-06 NOTE — Discharge Instructions (Signed)

## 2015-10-06 NOTE — MAU Provider Note (Signed)
History     CSN: [id number]  Arrival date and time: 10/05/15 2330   First Provider Initiated Contact with Patient 10/06/15 0000      Chief Complaint  Patient presents with  . Abdominal Pain   HPI Ms. Kathryn Suarez is a 29 y.o. G0P0 who presents to MAU today with complaint increased urinary frequency, urgency and pelvic discomfort with urination only. She denies abnormal discharge, bleeding, fever, flank pain or N/V/D. She states occasional low back pain. She has a history of AUB and has a Mirena and no longer has periods.   OB History    Gravida Para Term Preterm AB TAB SAB Ectopic Multiple Living   0               Past Medical History  Diagnosis Date  . Asthma   . Hypertension   . Ovarian cyst   . Chronic pelvic pain in female   . Kidney stones   . Schizophrenia ([place])   . Bipolar disorder ([place])   . Sickle cell trait ([place])   . Anemia   . DUB (dysfunctional uterine bleeding)   . Blood transfusion without reported diagnosis     pt has had more than 10 transfusions    Past Surgical History  Procedure Laterality Date  . Dilation and curettage of uterus    . Laparoscopy abdomen diagnostic N/A   . Excision of tongue lesion      Family History  Problem Relation Age of Onset  . Hypertension Mother   . Sickle cell trait Father     Social History  Substance Use Topics  . Smoking status: Never Smoker   . Smokeless tobacco: Never Used  . Alcohol Use: No    Allergies:  Allergies  Allergen Reactions  . Latex Rash    Prescriptions prior to admission  Medication Sig Dispense Refill Last Dose  . ibuprofen (ADVIL,MOTRIN) 800 MG tablet Take 800 mg by mouth every 8 (eight) hours as needed.   10/05/2015 at 1600  . oxyCODONE-acetaminophen (PERCOCET) 5-325 MG per tablet Take 1 tablet by mouth every 6 (six) hours as needed. pain   10/04/2015 at Unknown time  . albuterol (PROVENTIL HFA;VENTOLIN HFA) 108 (90 BASE) MCG/ACT inhaler Inhale 2 puffs into the lungs every 6  (six) hours as needed. Shortness of breath   More than a month at Unknown time  . cyclobenzaprine (FLEXERIL) 5 MG tablet Take 1 tablet (5 mg total) by mouth 3 (three) times daily as needed for muscle spasms. 30 tablet 0 Taking    Review of Systems  Constitutional: Negative for fever and malaise/fatigue.  Gastrointestinal: Negative for nausea, vomiting, abdominal pain, diarrhea and constipation.  Genitourinary: Positive for dysuria, urgency and frequency. Negative for hematuria and flank pain.       Neg -vaginal bleeding, discharge   Physical Exam   Blood pressure 132/87, pulse 77, temperature 98.7 F (37.1 C), temperature source Oral, resp. rate 18, height 5\' 4"  (1.626 m), weight 253 lb 12.8 oz (115.123 kg), SpO2 99 %.  Physical Exam  Nursing note and vitals reviewed. Constitutional: She is oriented to person, place, and time. She appears well-developed and well-nourished. No distress.  HENT:  Head: Normocephalic and atraumatic.  Cardiovascular: Normal rate.   Respiratory: Effort normal.  GI: Soft. She exhibits no distension and no mass. There is no tenderness. There is no rebound and no guarding.  Neurological: She is alert and oriented to person, place, and time.  Skin: Skin is warm  and dry. No erythema.  Psychiatric: She has a normal mood and affect.    Results for orders placed or performed during the hospital encounter of 10/05/15 (from the past 24 hour(s))  Urinalysis, Routine w reflex microscopic (not at [facility])     Status: Abnormal   Collection Time: 10/05/15 11:41 PM  Result Value Ref Range   Color, Urine YELLOW YELLOW   APPearance CLEAR CLEAR   Specific Gravity, Urine 1.020 1.005 - 1.030   pH 6.0 5.0 - 8.0   Glucose, UA NEGATIVE NEGATIVE mg/dL   Hgb urine dipstick TRACE (A) NEGATIVE   Bilirubin Urine NEGATIVE NEGATIVE   Ketones, ur NEGATIVE NEGATIVE mg/dL   Protein, ur NEGATIVE NEGATIVE mg/dL   Urobilinogen, UA 0.2 0.0 - 1.0 mg/dL   Nitrite NEGATIVE NEGATIVE    Leukocytes, UA NEGATIVE NEGATIVE  Urine microscopic-add on     Status: None   Collection Time: 10/05/15 11:41 PM  Result Value Ref Range   Squamous Epithelial / LPF RARE RARE   WBC, UA 3-6 <3 WBC/hpf   RBC / HPF 0-2 <3 RBC/hpf   Bacteria, UA RARE RARE   Urine-Other MUCOUS PRESENT   Pregnancy, urine POC     Status: None   Collection Time: 10/05/15 11:42 PM  Result Value Ref Range   Preg Test, Ur NEGATIVE NEGATIVE    MAU Course  Procedures None  MDM UPT - negative UA today shows no evidence of UTI Urine culture pending Assessment and Plan  A: Urinary frequency Dysuria  P: Discharge home Rx for Pyridium given to patient Patient has Percocet and Ibuprofen previously prescribed as well Warning signs for worsening condition discussed Urine culture pending Patient advised to follow-up with [facility] as needed if symptoms persist or worsen Patient may return to MAU as needed or if her condition were to change or worsen   Luvenia Redden, PA-C  10/06/2015, 12:00 AM

## 2015-10-07 LAB — URINE CULTURE

## 2016-01-20 ENCOUNTER — Ambulatory Visit (INDEPENDENT_AMBULATORY_CARE_PROVIDER_SITE_OTHER): Payer: Medicaid Other | Admitting: Obstetrics & Gynecology

## 2016-01-20 ENCOUNTER — Encounter: Payer: Self-pay | Admitting: Obstetrics & Gynecology

## 2016-01-20 VITALS — BP 124/76 | HR 92 | Temp 98.5°F | Wt 252.1 lb

## 2016-01-20 DIAGNOSIS — N970 Female infertility associated with anovulation: Secondary | ICD-10-CM

## 2016-01-20 DIAGNOSIS — Z30432 Encounter for removal of intrauterine contraceptive device: Secondary | ICD-10-CM | POA: Diagnosis present

## 2016-01-20 MED ORDER — PRENATAL VITAMINS 0.8 MG PO TABS: 1.0000 | ORAL_TABLET | Freq: Every day | ORAL | Status: DC

## 2016-01-20 NOTE — Patient Instructions (Signed)
Preparing for Pregnancy Before trying to become pregnant, make an appointment with your health care provider (preconception care). The goal is to help you have a healthy, safe pregnancy. At your first appointment, your health care provider will:   Do a complete physical exam, including a Pap test.  Take a complete medical history.  Give you advice and help you resolve any problems. PRECONCEPTION CHECKLIST Here is a list of the basics to cover with your health care provider at your preconception visit:  Medical history.  Tell your health care provider about any diseases you have had. Many diseases can affect your pregnancy.  Include your partner's medical history and family history.  Make sure you have been tested for sexually transmitted infections (STIs). These can affect your pregnancy. In some cases, they can be passed to your baby. Tell your health care provider about any history of STIs.  Make sure your health care provider knows about any previous problems you have had with conception or pregnancy.  Tell your health care provider about any medicine you take. This includes herbal supplements and over-the-counter medicines.  Make sure all your immunizations are up to date. You may need to make additional appointments.  Ask your health care provider if you need any vaccinations or if there are any you should avoid.  Diet.  It is especially important to eat a healthy, balanced diet with the right nutrients when you are pregnant.  Ask your health care provider to help you get to a healthy weight before pregnancy.  If you are overweight, you are at higher risk for certain complications. These include high blood pressure, diabetes, and preterm birth.  If you are underweight, you are more likely to have a low-birth-weight baby.  Lifestyle.  Tell your health care provider about lifestyle factors such as alcohol use, drug use, or smoking.  Describe any harmful substances you may  be exposed to at work or home. These can include chemicals, pesticides, and radiation.  Mental health.  Let your health care provider know if you have been feeling depressed or anxious.  Let your health care provider know if you have a history of substance abuse.  Let your health care provider know if you do not feel safe at home. HOME INSTRUCTIONS TO PREPARE FOR PREGNANCY Follow your health care provider's advice and instructions.   Keep an accurate record of your menstrual periods. This makes it easier for your health care provider to determine your baby's due date.  Begin taking prenatal vitamins and folic acid supplements daily. Take them as directed by your health care provider.  Eat a balanced diet. Get help from a nutrition counselor if you have questions or need help.  Get regular exercise. Try to be active for at least 30 minutes a day most days of the week.  Quit smoking, if you smoke.  Do not drink alcohol.  Do not take illegal drugs.  Get medical problems, such as diabetes or high blood pressure, under control.  If you have diabetes, make sure you do the following:  Have good blood sugar control. If you have type 1 diabetes, use multiple daily doses of insulin. Do not use split-dose or premixed insulin.  Have an eye exam by a qualified eye care professional trained in caring for people with diabetes.  Get evaluated by your health care provider for cardiovascular disease.  Get to a healthy weight. If you are overweight or obese, reduce your weight with the help of a qualified health   professional such as a registered dietitian. Ask your health care provider what the right weight range is for you. HOW DO I KNOW I AM PREGNANT? You may be pregnant if you have been sexually active and you miss your period. Symptoms of early pregnancy include:   Mild cramping.  Very light vaginal bleeding (spotting).  Feeling unusually tired.  Morning sickness. If you have any of  these symptoms, take a home pregnancy test. These tests look for a hormone called human chorionic gonadotropin (hCG) in your urine. Your body begins to make this hormone during early pregnancy. These tests are very accurate. Wait until at least the first day you miss your period to take one. If you get a positive result, call your health care provider to make appointments for prenatal care. WHAT SHOULD I DO IF I BECOME PREGNANT?  Make an appointment with your health care provider by week 12 of your pregnancy at the latest.  Do not smoke. Smoking can be harmful to your baby.  Do not drink alcoholic beverages. Alcohol is related to a number of birth defects.  Avoid toxic odors and chemicals.  You may continue to have sexual intercourse if it does not cause pain or other problems, such as vaginal bleeding.   This information is not intended to replace advice given to you by your health care provider. Make sure you discuss any questions you have with your health care provider.   Document Released: 11/12/2008 Document Revised: 12/21/2014 Document Reviewed: 11/06/2013 Elsevier Interactive Patient Education 2016 [employer]  

## 2016-01-20 NOTE — Progress Notes (Signed)
Patient ID: Kathryn Suarez, female   DOB: Jan 28, 1986, 30 y.o.   MRN: [id number]  Chief Complaint  Patient presents with  . Follow-up    IUD check   pain in pelvis  HPI Kathryn Suarez is a 30 y.o. female.  G0P0  No menses with IUD in situ, believes she has pelvic pain due to IUD. 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 Amenable to removal or IUD today HPI  Past Medical History  Diagnosis Date  . Asthma   . Hypertension   . Ovarian cyst   . Chronic pelvic pain in female   . Kidney stones   . Schizophrenia ([place])   . Bipolar disorder ([place])   . Sickle cell trait ([place])   . Anemia   . DUB (dysfunctional uterine bleeding)   . Blood transfusion without reported diagnosis     pt has had more than 10 transfusions    Past Surgical History  Procedure Laterality Date  . Dilation and curettage of uterus    . Laparoscopy abdomen diagnostic N/A   . Excision of tongue lesion      Family History  Problem Relation Age of Onset  . Hypertension Mother   . Sickle cell trait Father     Social History Social History  Substance Use Topics  . Smoking status: Never Smoker   . Smokeless tobacco: Never Used  . Alcohol Use: No    Allergies  Allergen Reactions  . Latex Rash    Current Outpatient Prescriptions  Medication Sig Dispense Refill  . albuterol (PROVENTIL HFA;VENTOLIN HFA) 108 (90 BASE) MCG/ACT inhaler Inhale 2 puffs into the lungs every 6 (six) hours as needed. Reported on 01/20/2016    . ibuprofen (ADVIL,MOTRIN) 800 MG tablet Take 800 mg by mouth every 8 (eight) hours as needed. Reported on 01/20/2016    . oxyCODONE-acetaminophen (PERCOCET) 5-325 MG per tablet Take 1 tablet by mouth every 6 (six) hours as needed. Reported on 01/20/2016    . phenazopyridine (PYRIDIUM) 200 MG tablet Take 1 tablet (200 mg total) by mouth 3 (three) times daily. (Patient not taking: Reported on 01/20/2016) 6 tablet 0  . Prenatal Multivit-Min-Fe-FA (PRENATAL VITAMINS)  0.8 MG tablet Take 1 tablet by mouth daily. 30 tablet 12   No current facility-administered medications for this visit.    Review of Systems Review of Systems  Constitutional: Negative.   Genitourinary: Positive for pelvic pain. Hematuria: sharp pain for one month.    Blood pressure 124/76, pulse 92, temperature 98.5 F (36.9 C), weight 252 lb 1.6 oz (114.352 kg).  Physical Exam Physical Exam  Constitutional: She is oriented to person, place, and time. She appears well-developed. No distress.  Genitourinary: Vagina normal. No vaginal discharge found.  Time out and consent signed for removal of IUD. String grasped and removed intact  Neurological: She is alert and oriented to person, place, and time.  Psychiatric: She has a normal mood and affect. Her behavior is normal.    Data Reviewed   Assessment    Pelvic pain with IUD and plans pregnancy H/O DUB     Plan    Report if menstrual problem Prenatal vitamin daily Consider seeing Dr Angela Adam 01/20/2016, 3:48 PM

## 2016-06-04 ENCOUNTER — Encounter (HOSPITAL_COMMUNITY): Payer: Self-pay | Admitting: *Deleted

## 2016-06-04 ENCOUNTER — Inpatient Hospital Stay (HOSPITAL_COMMUNITY)
Admission: AD | Admit: 2016-06-04 | Discharge: 2016-06-05 | Disposition: A | Discharge status: Home / Self Care | Payer: Medicaid Other | Source: Ambulatory Visit | Attending: Obstetrics & Gynecology | Admitting: Obstetrics & Gynecology

## 2016-06-04 DIAGNOSIS — D573 Sickle-cell trait: Secondary | ICD-10-CM | POA: Insufficient documentation

## 2016-06-04 DIAGNOSIS — N939 Abnormal uterine and vaginal bleeding, unspecified: Secondary | ICD-10-CM

## 2016-06-04 DIAGNOSIS — Z91040 Latex allergy status: Secondary | ICD-10-CM | POA: Insufficient documentation

## 2016-06-04 NOTE — MAU Note (Addendum)
PT  SAYS  HAD IUD -  INSERTED  IN 2013.  TOOK IT OUT IN April.     THEN  NO BLEEDING   UNTIL  6-5-     AND  HAS  CONTINUED .   HAS HAD THIS  PROBLEM--  HAD BLOOD TRANSFUSION IN PAST.   PASSING   CLOTS.      IN TRIAGE-    SMALL AMT   LIGHT   RED.         NO CRAMPS.      SAYS HIPS  HURT.    SHE HAS CALLED CLINIC-  YESTERDAY-   WAITING  FOR AN APPOINTMENT  .        YESTERDAY   TOOK   PERCOCET-   WED AM-  IS ON PAIN CONTRACT.     TOOK IBUPROFEN      800MG   AT 4 PM.

## 2016-06-04 NOTE — MAU Note (Signed)
RE-EVALUATE   SMALL     INCREASING   BLEEDING-  CHANGED  PAD.

## 2016-06-05 DIAGNOSIS — D573 Sickle-cell trait: Secondary | ICD-10-CM | POA: Diagnosis not present

## 2016-06-05 DIAGNOSIS — N939 Abnormal uterine and vaginal bleeding, unspecified: Secondary | ICD-10-CM

## 2016-06-05 DIAGNOSIS — Z91040 Latex allergy status: Secondary | ICD-10-CM | POA: Diagnosis not present

## 2016-06-05 LAB — WET PREP, GENITAL
CLUE CELLS WET PREP: NONE SEEN
SPERM: NONE SEEN
TRICH WET PREP: NONE SEEN
YEAST WET PREP: NONE SEEN

## 2016-06-05 LAB — CBC
HEMATOCRIT: 32.8 % — AB (ref 36.0–46.0)
HEMOGLOBIN: 11.3 g/dL — AB (ref 12.0–15.0)
MCH: 27.2 pg (ref 26.0–34.0)
MCHC: 34.5 g/dL (ref 30.0–36.0)
MCV: 78.8 fL (ref 78.0–100.0)
Platelets: 237 10*3/uL (ref 150–400)
RBC: 4.16 MIL/uL (ref 3.87–5.11)
RDW: 13 % (ref 11.5–15.5)
WBC: 9.3 10*3/uL (ref 4.0–10.5)

## 2016-06-05 LAB — URINALYSIS, DIPSTICK ONLY
Bilirubin Urine: NEGATIVE
Glucose, UA: 500 mg/dL — AB
Ketones, ur: >80 mg/dL — AB
Nitrite: POSITIVE — AB
SPECIFIC GRAVITY, URINE: 1.01 (ref 1.005–1.030)
pH: 6.5 (ref 5.0–8.0)

## 2016-06-05 LAB — GC/CHLAMYDIA PROBE AMP (~~LOC~~) NOT AT ARMC
Chlamydia: NEGATIVE
Neisseria Gonorrhea: NEGATIVE

## 2016-06-05 LAB — POCT PREGNANCY, URINE: PREG TEST UR: NEGATIVE

## 2016-06-05 MED ORDER — MEGESTROL ACETATE 40 MG PO TABS: 80.0000 mg | ORAL_TABLET | Freq: Two times a day (BID) | ORAL | Status: DC

## 2016-06-05 NOTE — Discharge Instructions (Signed)

## 2016-06-05 NOTE — MAU Provider Note (Signed)
History     CSN: [id number]  Arrival date and time: 06/04/16 2119   First Provider Initiated Contact with Patient 06/05/16 0138      Chief Complaint  Patient presents with  . Vaginal Bleeding   Vaginal Bleeding The patient's primary symptoms include vaginal bleeding. This is a new problem. The current episode started 1 to 4 weeks ago. The problem occurs constantly. The problem has been unchanged. The pain is moderate. The problem affects both sides. She is pregnant. Associated symptoms include abdominal pain and nausea. Pertinent negatives include no chills, constipation, diarrhea, dysuria, fever, frequency, urgency or vomiting. The vaginal discharge was bloody. The vaginal bleeding is typical of menses. She has been passing clots (about the size of a ping pong ball. ). Exacerbated by: patient had IUD removed, and bleeding started soon after that.  She has tried NSAIDs for the symptoms. The treatment provided no relief.    Past Medical History  Diagnosis Date  . Asthma   . Hypertension   . Ovarian cyst   . Chronic pelvic pain in female   . Kidney stones   . Schizophrenia ([place])   . Bipolar disorder ([place])   . Sickle cell trait ([place])   . Anemia   . DUB (dysfunctional uterine bleeding)   . Blood transfusion without reported diagnosis     pt has had more than 10 transfusions    Past Surgical History  Procedure Laterality Date  . Dilation and curettage of uterus    . Laparoscopy abdomen diagnostic N/A   . Excision of tongue lesion      Family History  Problem Relation Age of Onset  . Hypertension Mother   . Sickle cell trait Father     Social History  Substance Use Topics  . Smoking status: Never Smoker   . Smokeless tobacco: Never Used  . Alcohol Use: No    Allergies:  Allergies  Allergen Reactions  . Latex Rash    Prescriptions prior to admission  Medication Sig Dispense Refill Last Dose  . albuterol (PROVENTIL HFA;VENTOLIN HFA) 108 (90 BASE) MCG/ACT inhaler  Inhale 2 puffs into the lungs every 6 (six) hours as needed. Reported on 01/20/2016   Not Taking  . ibuprofen (ADVIL,MOTRIN) 800 MG tablet Take 800 mg by mouth every 8 (eight) hours as needed. Reported on 01/20/2016   Not Taking  . oxyCODONE-acetaminophen (PERCOCET) 5-325 MG per tablet Take 1 tablet by mouth every 6 (six) hours as needed. Reported on 01/20/2016   Not Taking  . phenazopyridine (PYRIDIUM) 200 MG tablet Take 1 tablet (200 mg total) by mouth 3 (three) times daily. (Patient not taking: Reported on 01/20/2016) 6 tablet 0 Not Taking  . Prenatal Multivit-Min-Fe-FA (PRENATAL VITAMINS) 0.8 MG tablet Take 1 tablet by mouth daily. 30 tablet 12     Review of Systems  Constitutional: Negative for fever and chills.  Gastrointestinal: Positive for nausea and abdominal pain. Negative for vomiting, diarrhea and constipation.  Genitourinary: Positive for vaginal bleeding. Negative for dysuria, urgency and frequency.   Physical Exam   Blood pressure 126/74, pulse 79, temperature 97.7 F (36.5 C), temperature source Oral, resp. rate 20, height 5\' 3"  (1.6 m), weight 115.214 kg (254 lb), last menstrual period 05/28/2012.  Physical Exam  Nursing note and vitals reviewed. Constitutional: She is oriented to person, place, and time. She appears well-developed and well-nourished. No distress.  HENT:  Head: Normocephalic.  Cardiovascular: Normal rate.   Respiratory: Effort normal.  GI: Soft. There is no  tenderness. There is no rebound.  Genitourinary:   External: no lesion Vagina: small amount of blood seen  Cervix: pink, smooth, no CMT Uterus: NSSC Adnexa: NT   Neurological: She is alert and oriented to person, place, and time.  Skin: Skin is warm and dry.  Psychiatric: She has a normal mood and affect.   Results for orders placed or performed during the hospital encounter of 06/04/16 (from the past 24 hour(s))  Pregnancy, urine POC     Status: None   Collection Time: 06/05/16 12:47 AM  Result  Value Ref Range   Preg Test, Ur NEGATIVE NEGATIVE  CBC     Status: Abnormal   Collection Time: 06/05/16  1:16 AM  Result Value Ref Range   WBC 9.3 4.0 - 10.5 K/uL   RBC 4.16 3.87 - 5.11 MIL/uL   Hemoglobin 11.3 (L) 12.0 - 15.0 g/dL   HCT 32.8 (L) 36.0 - 46.0 %   MCV 78.8 78.0 - 100.0 fL   MCH 27.2 26.0 - 34.0 pg   MCHC 34.5 30.0 - 36.0 g/dL   RDW 13.0 11.5 - 15.5 %   Platelets 237 150 - 400 K/uL    MAU Course  Procedures  MDM   Assessment and Plan   1. Abnormal uterine bleeding    DC home Comfort measures reviewed  Bleeding precautions RX: megace 2 pills BID #60 with 1 RF  Return to MAU as needed FU with OB as planned  Follow-up Information    Follow up with [facility].   Specialty:  Obstetrics and Gynecology   Why:  They will call you with an appointment   Contact information:   [place] [phone]        Mathis Bud 06/05/2016, 1:39 AM

## 2016-07-03 ENCOUNTER — Encounter: Payer: Medicaid Other | Admitting: Obstetrics & Gynecology

## 2016-07-06 ENCOUNTER — Encounter: Payer: Medicaid Other | Admitting: Obstetrics & Gynecology

## 2016-07-25 ENCOUNTER — Encounter (HOSPITAL_COMMUNITY): Payer: Self-pay | Admitting: Emergency Medicine

## 2016-07-25 ENCOUNTER — Ambulatory Visit (HOSPITAL_COMMUNITY)
Admission: EM | Admit: 2016-07-25 | Discharge: 2016-07-25 | Disposition: A | Discharge status: Home / Self Care | Payer: Medicaid Other | Attending: Family Medicine | Admitting: Family Medicine

## 2016-07-25 DIAGNOSIS — H8390 Unspecified disease of inner ear, unspecified ear: Secondary | ICD-10-CM | POA: Diagnosis not present

## 2016-07-25 DIAGNOSIS — N939 Abnormal uterine and vaginal bleeding, unspecified: Secondary | ICD-10-CM | POA: Diagnosis not present

## 2016-07-25 DIAGNOSIS — R42 Dizziness and giddiness: Secondary | ICD-10-CM

## 2016-07-25 LAB — POCT I-STAT, CHEM 8
BUN: 9 mg/dL (ref 6–20)
CHLORIDE: 103 mmol/L (ref 101–111)
Calcium, Ion: 1.18 mmol/L (ref 1.13–1.30)
Creatinine, Ser: 0.9 mg/dL (ref 0.44–1.00)
GLUCOSE: 95 mg/dL (ref 65–99)
HCT: 39 % (ref 36.0–46.0)
HEMOGLOBIN: 13.3 g/dL (ref 12.0–15.0)
POTASSIUM: 3.8 mmol/L (ref 3.5–5.1)
SODIUM: 141 mmol/L (ref 135–145)
TCO2: 26 mmol/L (ref 0–100)

## 2016-07-25 MED ORDER — MECLIZINE HCL 25 MG PO TABS: 25.0000 mg | ORAL_TABLET | Freq: Three times a day (TID) | ORAL | 0 refills | Status: DC | PRN

## 2016-07-25 NOTE — ED Triage Notes (Signed)
Pt c/o feeling LH onset this morning... Reports she's felt like this in the past and believes it may be due to her vaginal bleeding.   States she is going through 4-5 pads/day   Has been seen at MAU for similar sx in the past... Has appt w/GYN at the end of the month  Pt is crying and wants the [facility] feeling to go away  A&O x4... NAD

## 2016-07-25 NOTE — ED Provider Notes (Signed)
CSN: [id number]     Arrival date & time 07/25/16  1701 History   First MD Initiated Contact with Patient 07/25/16 1754     Chief Complaint  Patient presents with  . Dizziness   (Consider location/radiation/quality/duration/timing/severity/associated sxs/prior Treatment) 30 year old morbidly obese female has a history of dysfunctional uterine bleeding, chronic vaginal bleeding, chronic pelvic pain, anemia, sickle cell trait, schizophrenia, bipolar disorder presents to the urgent care complaining of dizziness and lightheadedness.  also having body pain and weakness. She states she has had a UV for several years and is under the care of a doctor. She has had D&Cs, exploratory pelvic surgery, IUD ease, different types of hormones and other treatment. She states the bleeding is unchanged and she is not particular concerned about the bleeding but more so the lightheadedness and dizziness.  The dizziness is worse when lying down and getting up quickly, sudden body  position changes and turning her head.  She has chronic pelvic pain that she believes is attributed to an ovarian cyst and takes Percocet on a daily basis.      Past Medical History:  Diagnosis Date  . Anemia   . Asthma   . Bipolar disorder ([place])   . Blood transfusion without reported diagnosis    pt has had more than 10 transfusions  . Chronic pelvic pain in female   . DUB (dysfunctional uterine bleeding)   . Hypertension   . Kidney stones   . Ovarian cyst   . Schizophrenia ([place])   . Sickle cell trait [facility])    Past Surgical History:  Procedure Laterality Date  . DILATION AND CURETTAGE OF UTERUS    . EXCISION OF TONGUE LESION    . LAPAROSCOPY ABDOMEN DIAGNOSTIC N/A    Family History  Problem Relation Age of Onset  . Hypertension Mother   . Sickle cell trait Father    Social History  Substance Use Topics  . Smoking status: Never Smoker  . Smokeless tobacco: Never Used  . Alcohol use No   OB History    Gravida  Para Term Preterm AB Living   0             SAB TAB Ectopic Multiple Live Births                 Review of Systems  Constitutional: Positive for activity change and fatigue. Negative for fever.  HENT: Negative.   Eyes: Negative.   Respiratory: Negative for cough and shortness of breath.   Cardiovascular: Negative for chest pain and leg swelling.  Gastrointestinal: Positive for abdominal pain. Negative for nausea and vomiting.  Genitourinary: Positive for pelvic pain and vaginal bleeding. Negative for vaginal discharge.  Musculoskeletal: Positive for myalgias.  Skin: Negative.   Neurological: Positive for dizziness, weakness and light-headedness. Negative for tremors, seizures, syncope, facial asymmetry and speech difficulty.  Psychiatric/Behavioral: Negative.   All other systems reviewed and are negative.   Allergies  Latex  Home Medications   Prior to Admission medications   Medication Sig Start Date End Date Taking? Authorizing Provider  oxyCODONE-acetaminophen (PERCOCET) 5-325 MG per tablet Take 1 tablet by mouth every 6 (six) hours as needed. Reported on 01/20/2016   Yes Historical Provider, MD  albuterol (PROVENTIL HFA;VENTOLIN HFA) 108 (90 BASE) MCG/ACT inhaler Inhale 2 puffs into the lungs every 6 (six) hours as needed. Reported on 01/20/2016    Historical Provider, MD  ibuprofen (ADVIL,MOTRIN) 800 MG tablet Take 800 mg by mouth every 8 (eight) hours as  needed. Reported on 01/20/2016    Historical Provider, MD  meclizine (ANTIVERT) 25 MG tablet Take 1 tablet (25 mg total) by mouth 3 (three) times daily as needed for dizziness. 07/25/16   Janne Napoleon, NP  megestrol (MEGACE) 40 MG tablet Take 2 tablets (80 mg total) by mouth 2 (two) times daily. 06/05/16   Tresea Mall, CNM  phenazopyridine (PYRIDIUM) 200 MG tablet Take 1 tablet (200 mg total) by mouth 3 (three) times daily. Patient not taking: Reported on 01/20/2016 10/06/15   Luvenia Redden, PA-C  Prenatal Multivit-Min-Fe-FA  (PRENATAL VITAMINS) 0.8 MG tablet Take 1 tablet by mouth daily. 01/20/16   Woodroe Mode, MD   Meds Ordered and Administered this Visit  Medications - No data to display  BP 129/78 (BP Location: Left Arm)   Pulse 94   Temp 98.7 F (37.1 C) (Oral)   Resp 20   SpO2 97%  Orthostatic VS for the past 24 hrs:  BP- Lying Pulse- Lying BP- Sitting Pulse- Sitting BP- Standing at 0 minutes Pulse- Standing at 0 minutes  07/25/16 1833 112/58 82 121/60 89 121/73 92    Physical Exam  Constitutional: She is oriented to person, place, and time. She appears well-developed and well-nourished.  HENT:  Head: Normocephalic and atraumatic.  Mouth/Throat: No oropharyngeal exudate.  OP with clear PND  Eyes: Conjunctivae and EOM are normal. Pupils are equal, round, and reactive to light.  Neck: Normal range of motion. Neck supple. No JVD present.  Cardiovascular: Normal rate, regular rhythm and normal heart sounds.   Pulmonary/Chest: Effort normal and breath sounds normal. No respiratory distress. She has no wheezes.  Abdominal: Soft. Bowel sounds are normal. She exhibits no distension and no mass. There is no rebound and no guarding.  Mild generalized tenderness. Patient reports this is her usual baseline.  Musculoskeletal: Normal range of motion. She exhibits no edema.  Lymphadenopathy:    She has no cervical adenopathy.  Neurological: She is alert and oriented to person, place, and time. She has normal strength. No cranial nerve deficit or sensory deficit. GCS eye subscore is 4. GCS verbal subscore is 5. GCS motor subscore is 6.  Skin: Skin is warm and dry. She is not diaphoretic.  Psychiatric: She has a normal mood and affect.  Nursing note and vitals reviewed.   Urgent Care Course   Clinical Course    Procedures (including critical care time)  Labs Review Labs Reviewed  POCT I-STAT, CHEM 8   Results for orders placed or performed during the hospital encounter of 07/25/16  I-STAT, chem 8   Result Value Ref Range   Sodium 141 135 - 145 mmol/L   Potassium 3.8 3.5 - 5.1 mmol/L   Chloride 103 101 - 111 mmol/L   BUN 9 6 - 20 mg/dL   Creatinine, Ser 0.90 0.44 - 1.00 mg/dL   Glucose, Bld 95 65 - 99 mg/dL   Calcium, Ion 1.18 1.13 - 1.30 mmol/L   TCO2 26 0 - 100 mmol/L   Hemoglobin 13.3 12.0 - 15.0 g/dL   HCT 39.0 36.0 - 46.0 %     Imaging Review No results found.   Visual Acuity Review  Right Eye Distance:   Left Eye Distance:   Bilateral Distance:    Right Eye Near:   Left Eye Near:    Bilateral Near:         MDM   1. Dizziness   2. Disorder of inner ear, unspecified laterality  3. Abnormal uterine bleeding (AUB)     Drink plenty of fluids. He must increase her fluid intake to stay well-hydrated. Recommend adding Pedialyte or Gatorade. Take the medication as directed for dizziness. This may cause drowsiness. Follow-up with your doctor as scheduled, sooner for problems. Meds ordered this encounter  Medications  . meclizine (ANTIVERT) 25 MG tablet    Sig: Take 1 tablet (25 mg total) by mouth 3 (three) times daily as needed for dizziness.    Dispense:  15 tablet    Refill:  0    Order Specific Question:   Supervising Provider    Answer:   Billy Fischer [[id number]]       Janne Napoleon, NP 07/25/16 1913

## 2016-08-12 ENCOUNTER — Encounter: Payer: Medicaid Other | Admitting: Obstetrics & Gynecology

## 2016-09-10 ENCOUNTER — Encounter (HOSPITAL_COMMUNITY): Payer: Self-pay | Admitting: Emergency Medicine

## 2016-09-10 ENCOUNTER — Ambulatory Visit (HOSPITAL_COMMUNITY)
Admission: EM | Admit: 2016-09-10 | Discharge: 2016-09-10 | Disposition: A | Discharge status: Home / Self Care | Payer: Medicaid Other | Attending: Emergency Medicine | Admitting: Emergency Medicine

## 2016-09-10 DIAGNOSIS — M25562 Pain in left knee: Secondary | ICD-10-CM | POA: Diagnosis not present

## 2016-09-10 DIAGNOSIS — M25462 Effusion, left knee: Secondary | ICD-10-CM

## 2016-09-10 MED ORDER — PREDNISONE 10 MG (21) PO TBPK: 50.0000 mg | ORAL_TABLET | Freq: Every day | ORAL | 0 refills | Status: AC

## 2016-09-10 NOTE — ED Provider Notes (Signed)
CSN: [id number]     Arrival date & time 09/10/16  1002 History   First MD Initiated Contact with Patient 09/10/16 1111     Chief Complaint  Patient presents with  . Leg Pain   (Consider location/radiation/quality/duration/timing/severity/associated sxs/prior Treatment) 30 year old female presents with left knee pain and swelling for the past 2 days. Swelling and pain is all located in posterior area of knee and pain radiates midway down left leg. Denies any pain or trauma to area. She has a history of sickle cell trait and had her knee drained about 2 months at the sickle cell clinic. She has also had fluid in both shoulders that has needed drainage in the past as well.    The history is provided by the patient.    Past Medical History:  Diagnosis Date  . Anemia   . Asthma   . Bipolar disorder ([place])   . Blood transfusion without reported diagnosis    pt has had more than 10 transfusions  . Chronic pelvic pain in female   . DUB (dysfunctional uterine bleeding)   . Hypertension   . Kidney stones   . Ovarian cyst   . Schizophrenia ([place])   . Sickle cell trait [facility])    Past Surgical History:  Procedure Laterality Date  . DILATION AND CURETTAGE OF UTERUS    . EXCISION OF TONGUE LESION    . LAPAROSCOPY ABDOMEN DIAGNOSTIC N/A    Family History  Problem Relation Age of Onset  . Hypertension Mother   . Sickle cell trait Father    Social History  Substance Use Topics  . Smoking status: Never Smoker  . Smokeless tobacco: Never Used  . Alcohol use No   OB History    Gravida Para Term Preterm AB Living   0             SAB TAB Ectopic Multiple Live Births                 Review of Systems  Constitutional: Negative for chills, fatigue, fever and unexpected weight change.  Respiratory: Negative for cough, chest tightness, shortness of breath and wheezing.   Cardiovascular: Negative for chest pain.  Musculoskeletal: Positive for arthralgias and joint swelling.  Skin: Negative  for color change, rash and wound.  Neurological: Negative for dizziness, weakness and numbness.    Allergies  Latex  Home Medications   Prior to Admission medications   Medication Sig Start Date End Date Taking? Authorizing Provider  oxyCODONE-acetaminophen (PERCOCET) 5-325 MG per tablet Take 1 tablet by mouth every 6 (six) hours as needed. Reported on 01/20/2016   Yes Historical Provider, MD  albuterol (PROVENTIL HFA;VENTOLIN HFA) 108 (90 BASE) MCG/ACT inhaler Inhale 2 puffs into the lungs every 6 (six) hours as needed. Reported on 01/20/2016    Historical Provider, MD  ibuprofen (ADVIL,MOTRIN) 800 MG tablet Take 800 mg by mouth every 8 (eight) hours as needed. Reported on 01/20/2016    Historical Provider, MD  meclizine (ANTIVERT) 25 MG tablet Take 1 tablet (25 mg total) by mouth 3 (three) times daily as needed for dizziness. 07/25/16   Janne Napoleon, NP  megestrol (MEGACE) 40 MG tablet Take 2 tablets (80 mg total) by mouth 2 (two) times daily. 06/05/16   Tresea Mall, CNM  predniSONE (STERAPRED UNI-PAK 21 TAB) 10 MG (21) TBPK tablet Take 5 tablets (50 mg total) by mouth daily. 09/10/16 09/15/16  Katy Apo, NP  Prenatal Multivit-Min-Fe-FA (PRENATAL VITAMINS) 0.8 MG  tablet Take 1 tablet by mouth daily. 01/20/16   Woodroe Mode, MD   Meds Ordered and Administered this Visit  Medications - No data to display  BP 119/76 (BP Location: Left Arm)   Pulse 89   Temp 98.7 F (37.1 C) (Oral)   Resp 18   SpO2 98%  No data found.   Physical Exam  Constitutional: She is oriented to person, place, and time. She appears well-developed and well-nourished. No distress.  Cardiovascular: Normal rate and regular rhythm.   Musculoskeletal: She exhibits edema and tenderness.       Left lower leg: She exhibits tenderness, swelling and edema. She exhibits no deformity.       Legs: Decreased range of motion of left leg due to pain and swelling. Edema present at popliteal fossa- 1-2cm deep. Very tender.  Normal pulses and good capillary refill in toes. No neuro/sensory deficits noted.   Neurological: She is alert and oriented to person, place, and time. She has normal strength. No sensory deficit.  Skin: Skin is warm and dry. Capillary refill takes less than 2 seconds. No rash noted.  Psychiatric: She has a normal mood and affect. Her behavior is normal. Judgment and thought content normal.    Urgent Care Course   Clinical Course    Procedures (including critical care time)  Labs Review Labs Reviewed - No data to display  Imaging Review No results found.   Visual Acuity Review  Right Eye Distance:   Left Eye Distance:   Bilateral Distance:    Right Eye Near:   Left Eye Near:    Bilateral Near:         MDM   1. Posterior left knee pain   2. Knee swelling, left    Discussed that I have not been trained in aspirating joint effusions but will allow my colleagues who have been trained to aspirate fluid from the knee. Patient declines drainage and would prefer to trial oral anti-inflammatory medication. Start Prednisone 50mg  daily for 5 days. Reviewed that patient should see an Orthopedic for the continued joint swelling and pain since it is recurring issue. Recommend follow-up with her PCP or Orthopedics  for further evaluation.    Katy Apo, NP 09/11/16 [phone]

## 2016-09-10 NOTE — ED Triage Notes (Signed)
Pt reports LLE pain, behind knee, onset 2 days  Reports she's had this in the past where they had to drain fluid that had build up.... States she feels it's similar  Denies inj/trauma  Slow gait... A&O x4... NAD

## 2016-09-10 NOTE — Discharge Instructions (Signed)
Start Prednisone daily for 5 days. May apply ice or heat to area as needed for comfort. May take Tylenol as needed for pain. Follow-up with an Orthopedic for further evaluation.

## 2016-10-19 ENCOUNTER — Encounter: Payer: Self-pay | Admitting: Obstetrics & Gynecology

## 2016-10-19 ENCOUNTER — Ambulatory Visit (INDEPENDENT_AMBULATORY_CARE_PROVIDER_SITE_OTHER): Payer: Medicaid Other | Admitting: Obstetrics & Gynecology

## 2016-10-19 VITALS — BP 127/92 | HR 94 | Wt 251.2 lb

## 2016-10-19 DIAGNOSIS — N83201 Unspecified ovarian cyst, right side: Secondary | ICD-10-CM

## 2016-10-19 DIAGNOSIS — N921 Excessive and frequent menstruation with irregular cycle: Secondary | ICD-10-CM

## 2016-10-19 LAB — TSH: TSH: 2.13 m[IU]/L

## 2016-10-19 MED ORDER — NORGESTIMATE-ETH ESTRADIOL 0.25-35 MG-MCG PO TABS: 1.0000 | ORAL_TABLET | Freq: Every day | ORAL | 11 refills | Status: DC

## 2016-10-19 NOTE — Progress Notes (Signed)
Patient ID: Kathryn Suarez, female   DOB: 1986-11-12, 30 y.o.   MRN: [id number]  Chief Complaint  Patient presents with  . Menorrhagia    HPI Kathryn Suarez is a 30 y.o. female.  G0P0 No LMP recorded. She has had decreased vaginal bleeding since taking Megace prescribed 6/23 by MAU. She is interested in cycle control and would take an OCP., no plans to conceive HPI  Past Medical History:  Diagnosis Date  . Anemia   . Asthma   . Bipolar disorder ([place])   . Blood transfusion without reported diagnosis    pt has had more than 10 transfusions  . Chronic pelvic pain in female   . DUB (dysfunctional uterine bleeding)   . Hypertension   . Kidney stones   . Ovarian cyst   . Schizophrenia ([place])   . Sickle cell trait [facility])     Past Surgical History:  Procedure Laterality Date  . DILATION AND CURETTAGE OF UTERUS    . EXCISION OF TONGUE LESION    . LAPAROSCOPY ABDOMEN DIAGNOSTIC N/A     Family History  Problem Relation Age of Onset  . Hypertension Mother   . Sickle cell trait Father     Social History Social History  Substance Use Topics  . Smoking status: Never Smoker  . Smokeless tobacco: Never Used  . Alcohol use No    Allergies  Allergen Reactions  . Latex Rash    Current Outpatient Prescriptions  Medication Sig Dispense Refill  . acetaminophen (TYLENOL) 500 MG tablet Take 500 mg by mouth every 6 (six) hours as needed.    Marland Kitchen albuterol (PROVENTIL HFA;VENTOLIN HFA) 108 (90 BASE) MCG/ACT inhaler Inhale 2 puffs into the lungs every 6 (six) hours as needed. Reported on 01/20/2016    . meclizine (ANTIVERT) 25 MG tablet Take 1 tablet (25 mg total) by mouth 3 (three) times daily as needed for dizziness. 15 tablet 0  . megestrol (MEGACE) 40 MG tablet Take 2 tablets (80 mg total) by mouth 2 (two) times daily. 60 tablet 1  . oxyCODONE-acetaminophen (PERCOCET) 5-325 MG per tablet Take 1 tablet by mouth every 6 (six) hours as needed. Reported on 01/20/2016    .  norgestimate-ethinyl estradiol (ORTHO-CYCLEN,SPRINTEC,PREVIFEM) 0.25-35 MG-MCG tablet Take 1 tablet by mouth daily. 1 Package 11  . Prenatal Multivit-Min-Fe-FA (PRENATAL VITAMINS) 0.8 MG tablet Take 1 tablet by mouth daily. (Patient not taking: Reported on 10/19/2016) 30 tablet 12   No current facility-administered medications for this visit.     Review of Systems Review of Systems  Constitutional: Negative.   Respiratory: Negative.   Gastrointestinal: Negative.   Genitourinary: Positive for menstrual problem. Negative for vaginal discharge.    Blood pressure (!) 127/92, pulse 94, weight 251 lb 3.2 oz (113.9 kg).  Physical Exam Physical Exam  Constitutional: She is oriented to person, place, and time. She appears well-developed. No distress.  Cardiovascular: Normal rate.   Pulmonary/Chest: Effort normal.  Neurological: She is alert and oriented to person, place, and time.  Psychiatric: She has a normal mood and affect. Her behavior is normal.    Data Reviewed Pap and [place] results, recent lab  Assessment    Patient Active Problem List   Diagnosis Date Noted  . Right ovarian complex cyst with small mural nodule 09/12/2013  . Menometrorrhagia 08/17/2013  . Sickle cell anemia ([place]) 05/22/2013  . 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 Breast discharge 10/16/2011  . Chronic pelvic pain in female 10/02/2011  Plan    [facility] Orders Placed This Encounter  Procedures  . US Pelvis Complete    Standing Status:   Future    Standing Expiration Date:   12/20/2017    Order Specific Question:   Reason for exam:    Answer:   DUB and h/o ovarian cyst    Order Specific Question:   Preferred imaging location?    Answer:   [facility]  . US Transvaginal Non-OB    Standing Status:   Future    Standing Expiration Date:   12/20/2017    Order Specific Question:   Reason for exam:    Answer:   DUB and h/o ovarian cyst    Order Specific Question:   Preferred imaging  location?    Answer:   [facility]  . TSH  . Testos,Total,Free and SHBG (Female)  . Hemoglobin A1c   RTC 6 mo       Gearldine Looney 10/19/2016, 11:50 AM

## 2016-10-19 NOTE — Progress Notes (Signed)
[place] scheduled for November 8th @ 0830.  Pt notified.

## 2016-10-20 LAB — HEMOGLOBIN A1C
Hgb A1c MFr Bld: 5.3 % (ref <5.7)
Mean Plasma Glucose: 105 mg/dL

## 2016-10-21 ENCOUNTER — Ambulatory Visit (HOSPITAL_COMMUNITY)
Admission: RE | Admit: 2016-10-21 | Discharge: 2016-10-21 | Disposition: A | Discharge status: Home / Self Care | Payer: Medicaid Other | Source: Ambulatory Visit | Attending: Obstetrics & Gynecology | Admitting: Obstetrics & Gynecology

## 2016-10-21 DIAGNOSIS — N921 Excessive and frequent menstruation with irregular cycle: Secondary | ICD-10-CM | POA: Diagnosis present

## 2016-10-21 DIAGNOSIS — N83201 Unspecified ovarian cyst, right side: Secondary | ICD-10-CM

## 2016-10-22 ENCOUNTER — Telehealth: Payer: Self-pay

## 2016-10-22 NOTE — Telephone Encounter (Signed)
Per Dr.Arnold, pt's [place] is normal.  Notified pt of normal [place].  Pt asked if she is to be take the [facility] pill.  I advised pt to please take the pill at the same time everyday and I recommended that she wait until she has completed 3-4 pill packs before she can determine how her bleeding will be.  Pt agreed with no further questions.

## 2016-10-24 LAB — TESTOS,TOTAL,FREE AND SHBG (FEMALE)
SEX HORMONE BINDING GLOB.: 29 nmol/L (ref 17–124)
Testosterone, Free: 5 pg/mL (ref 0.1–6.4)
Testosterone,Total,LC/MS/MS: 23 ng/dL (ref 2–45)

## 2016-11-20 ENCOUNTER — Telehealth: Payer: Self-pay | Admitting: *Deleted

## 2016-11-20 NOTE — Telephone Encounter (Signed)
Kathryn Suarez left a message this am she wants to speak with a nurse about her bleeding- states she is still having bleeding and it is not getting better and she needs to know what to do.

## 2016-11-20 NOTE — Telephone Encounter (Signed)
I called Zykeriah back and we discussed that she is just now on 2nd pack of birth control pills - first week. She states she has been bleeding everyday on the pills- wears 2 pads and changes every 2-3 hours and is mostly full. States she feels tired. States when was on megace it did help stop the bleeding, but has been bleeding for most of the year. I explained could take 3 cycles of pills to get her bleeding under control and if she feels dizzy or lightheaded or is filling pads in one hour or less to come to mau as her hemoglobin may be low. She denies dizziness or lightheadedness currently. I also informed her I would send a message to Dr. Roselie Awkward to see if he has any other reccomendations. She voices understanding.

## 2016-11-23 NOTE — Telephone Encounter (Signed)
Per discussion with Dr. Roselie Awkward may instruct patient to take 2 birth control pills daily until bleeding stops, then return to taking one daily, may skip placebo pills if still bleeding. I called Kathryn Suarez and we discussed this. She voices understanding. I also called her pharmacy and left a message she will be calling sooner for a refill because we have instructed her to take 2 pills a day to control bleeding.

## 2016-12-16 ENCOUNTER — Ambulatory Visit (INDEPENDENT_AMBULATORY_CARE_PROVIDER_SITE_OTHER): Payer: Medicaid Other | Admitting: Obstetrics & Gynecology

## 2016-12-16 ENCOUNTER — Other Ambulatory Visit (HOSPITAL_COMMUNITY)
Admission: RE | Admit: 2016-12-16 | Discharge: 2016-12-16 | Disposition: A | Discharge status: Home / Self Care | Payer: Medicaid Other | Source: Ambulatory Visit | Attending: Obstetrics & Gynecology | Admitting: Obstetrics & Gynecology

## 2016-12-16 ENCOUNTER — Encounter: Payer: Self-pay | Admitting: Obstetrics & Gynecology

## 2016-12-16 VITALS — BP 130/79 | HR 96

## 2016-12-16 DIAGNOSIS — N939 Abnormal uterine and vaginal bleeding, unspecified: Secondary | ICD-10-CM | POA: Diagnosis not present

## 2016-12-16 DIAGNOSIS — Z113 Encounter for screening for infections with a predominantly sexual mode of transmission: Secondary | ICD-10-CM | POA: Diagnosis not present

## 2016-12-16 DIAGNOSIS — Z30430 Encounter for insertion of intrauterine contraceptive device: Secondary | ICD-10-CM

## 2016-12-16 DIAGNOSIS — Z3202 Encounter for pregnancy test, result negative: Secondary | ICD-10-CM

## 2016-12-16 LAB — POCT URINALYSIS DIP (DEVICE)
BILIRUBIN URINE: NEGATIVE
GLUCOSE, UA: NEGATIVE mg/dL
KETONES UR: NEGATIVE mg/dL
LEUKOCYTES UA: NEGATIVE
Nitrite: NEGATIVE
PROTEIN: 30 mg/dL — AB
SPECIFIC GRAVITY, URINE: 1.015 (ref 1.005–1.030)
Urobilinogen, UA: 1 mg/dL (ref 0.0–1.0)
pH: 6 (ref 5.0–8.0)

## 2016-12-16 MED ORDER — LEVONORGESTREL 18.6 MCG/DAY IU IUD
INTRAUTERINE_SYSTEM | Freq: Once | INTRAUTERINE | Status: AC
  Administered 2016-12-16: 1 via INTRAUTERINE
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
## 2016-12-16 NOTE — Patient Instructions (Signed)

## 2016-12-17 LAB — GC/CHLAMYDIA PROBE AMP (~~LOC~~) NOT AT ARMC
Chlamydia: NEGATIVE
NEISSERIA GONORRHEA: NEGATIVE

## 2016-12-17 LAB — POCT PREGNANCY, URINE: PREG TEST UR: NEGATIVE

## 2017-01-20 ENCOUNTER — Ambulatory Visit: Payer: Medicaid Other | Admitting: Obstetrics & Gynecology

## 2017-02-08 ENCOUNTER — Ambulatory Visit (INDEPENDENT_AMBULATORY_CARE_PROVIDER_SITE_OTHER): Payer: Medicaid Other | Admitting: Obstetrics and Gynecology

## 2017-02-08 ENCOUNTER — Encounter: Payer: Self-pay | Admitting: Obstetrics and Gynecology

## 2017-02-08 VITALS — BP 117/72 | HR 77 | Wt 254.8 lb

## 2017-02-08 DIAGNOSIS — Z30431 Encounter for routine checking of intrauterine contraceptive device: Secondary | ICD-10-CM

## 2017-02-08 DIAGNOSIS — N6452 Nipple discharge: Secondary | ICD-10-CM | POA: Diagnosis not present

## 2017-02-08 NOTE — Progress Notes (Signed)
Obstetrics and Gynecology Visit Return Patient Evaluation  Appointment Date: 02/08/2017  Primary Care Provider: [facility]  Referring Provider: Pa, [facility]  Chief Complaint: IUD check up, b/l nipple discharge  History of Present Illness:  Kathryn Suarez is a 31 y.o. s/p Liletta 12/17/15 insertion for AUB. Patient states that her irregular bleeding is much improved and getting better and <pad per day and no pain.  She states this is similar to the improvement in bleeding that she had when she had a Mirena in the past for AUB.  Also noting some HA and b/l discharge for the past few weeks and wonders if this is from the IUD. She had this in the past (years ago) and it went away on its own, L>R and it looks clear/milky and no brown/green or bloody discharge. She denies any breast trauma or nipple stimulation or medications being started. She denies pain, lumps or bumps.  Patient not seuxally active.   Review of Systems: as noted in the History of Present Illness.  Medications: percocet PRN Allergies: is allergic to latex.  Physical Exam:  BP 117/72   Pulse 77   Wt 254 lb 12.8 oz (115.6 kg)   BMI 45.14 kg/m  Body mass index is 45.14 kg/m. General appearance: Well nourished, well developed female in no acute distress. Breast: some clear/milky white d/c from a few ducts on the right. None on the right. No masses, tenderness, LAD. Abdomen: diffusely non tender to palpation, non distended, and no masses, hernias Neuro/Psych:  Normal mood and affect. CN 2-12 grossly intact, EOMI, PERRL, 5/5 stregnth, normal gait, normal sensation.   Pelvic exam:  Two IUD strings present seen coming from the cervical os, approx 3-4cm EGBUS, vaginal vault and cervix: within normal limits   Assessment: IUD strings present in proper location, nipple discharge.   Plan:  1. Bilateral nipple discharge Follow up labs. HAs could be related to this. Consider imaging/neuro referral if negative.  -  TSH - Beta HCG, Quant - Prolactin  2. IUD check up Normal.    RTC: PRN  Durene Romans MD Attending Center for [facility])

## 2017-02-09 LAB — BETA HCG QUANT (REF LAB): hCG Quant: <1 m[IU]/mL

## 2017-02-09 LAB — PROLACTIN: PROLACTIN: 18.8 ng/mL (ref 4.8–23.3)

## 2017-02-09 LAB — TSH: TSH: 2.04 u[IU]/mL (ref 0.450–4.500)

## 2017-02-22 ENCOUNTER — Telehealth: Payer: Self-pay | Admitting: Obstetrics and Gynecology

## 2017-02-22 DIAGNOSIS — N643 Galactorrhea not associated with childbirth: Secondary | ICD-10-CM

## 2017-02-22 NOTE — Telephone Encounter (Signed)
GYN Telephone Note Patient called at [phone] and d/w her re: negative lab results. Still have b/l nipple discharge. Given close to age 31 recommend starting with diagnostic mammo and then possibly breast u/s. Pt amenable to plan. taks sent to pool for b/l diagnostic mammo  Durene Romans MD Attending Center for [place] [facility]) 02/22/2017 Time: [phone]

## 2017-02-23 ENCOUNTER — Other Ambulatory Visit: Payer: Self-pay | Admitting: Obstetrics and Gynecology

## 2017-02-23 DIAGNOSIS — N643 Galactorrhea not associated with childbirth: Secondary | ICD-10-CM

## 2017-02-24 ENCOUNTER — Telehealth: Payer: Self-pay

## 2017-02-24 NOTE — Telephone Encounter (Signed)
Per Dr. Ilda Basset, pt needs to have a bilateral breast US scheduled due to nipple discharge.  Breast US scheduled for March 22nd @ 1300 with arrival of 12:45pm @ [facility]. LM that we have scheduled an [place] appt at the [facility], time/date/address given.  If she has any questions to please give the office a call.

## 2017-03-02 NOTE — Telephone Encounter (Signed)
Called pt and informed pt that she has an appt scheduled at the [facility] on March 22nd @ 1245 arrival.  I left address information to the [place] on pt's VM as requested.  Pt did not have any other questions.

## 2017-03-04 ENCOUNTER — Ambulatory Visit
Admission: RE | Admit: 2017-03-04 | Discharge: 2017-03-04 | Disposition: A | Discharge status: Home / Self Care | Payer: Medicaid Other | Source: Ambulatory Visit | Attending: Obstetrics and Gynecology | Admitting: Obstetrics and Gynecology

## 2017-03-04 ENCOUNTER — Other Ambulatory Visit: Payer: Medicaid Other

## 2017-03-04 DIAGNOSIS — N643 Galactorrhea not associated with childbirth: Secondary | ICD-10-CM

## 2017-05-06 ENCOUNTER — Encounter (HOSPITAL_COMMUNITY): Payer: Self-pay | Admitting: Emergency Medicine

## 2017-05-06 ENCOUNTER — Ambulatory Visit (INDEPENDENT_AMBULATORY_CARE_PROVIDER_SITE_OTHER): Payer: Medicaid Other

## 2017-05-06 ENCOUNTER — Ambulatory Visit (HOSPITAL_COMMUNITY)
Admission: EM | Admit: 2017-05-06 | Discharge: 2017-05-06 | Disposition: A | Discharge status: Home / Self Care | Payer: Medicaid Other | Attending: Family Medicine | Admitting: Family Medicine

## 2017-05-06 DIAGNOSIS — S62501A Fracture of unspecified phalanx of right thumb, initial encounter for closed fracture: Secondary | ICD-10-CM

## 2017-05-06 NOTE — ED Triage Notes (Signed)
Pt closed her thumb in a door ten days ago.  Pt has no ROM in the thumb since it occurred.  Pt states she had some swelling but it has subsided.

## 2017-05-06 NOTE — ED Provider Notes (Signed)
[place]    CSN: [id number] Arrival date & time: 05/06/17  1135     History   Chief Complaint Chief Complaint  Patient presents with  . thumb injury    right    HPI Kathryn Suarez is a 31 y.o. female.   Pt closed her thumb in a door ten days ago.  Pt has no ROM in the thumb since it occurred.  Pt states she had some swelling but it has subsided.  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 Anemia   . Asthma   . Bipolar disorder ([place])   . Blood transfusion without reported diagnosis    pt has had more than 10 transfusions  . Chronic pelvic pain in female   . DUB (dysfunctional uterine bleeding)   . Hypertension   . Kidney stones   . Ovarian cyst   . Schizophrenia ([place])   . Sickle cell trait [facility])     Patient Active Problem List   Diagnosis Date Noted  . 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 Right ovarian complex cyst with small mural nodule 09/12/2013  . Menometrorrhagia 08/17/2013  . Sickle cell anemia ([place]) 05/22/2013  . 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 Breast discharge 10/16/2011  . Chronic pelvic pain in female 10/02/2011    Past Surgical History:  Procedure Laterality Date  . DILATION AND CURETTAGE OF UTERUS    . EXCISION OF TONGUE LESION    . LAPAROSCOPY ABDOMEN DIAGNOSTIC N/A     OB History    Gravida Para Term Preterm AB Living   0             SAB TAB Ectopic Multiple Live Births                   Home Medications    Prior to Admission medications   Medication Sig Start Date End Date Taking? Authorizing Provider  oxyCODONE-acetaminophen (PERCOCET) 5-325 MG per tablet Take 1 tablet by mouth every 6 (six) hours as needed. Reported on 01/20/2016   Yes [provider]  acetaminophen (TYLENOL) 500 MG tablet Take 500 mg by mouth every 6 (six) hours as needed.    [provider]  ibuprofen (ADVIL,MOTRIN) 200 MG tablet Take 200 mg by  mouth every 6 (six) hours as needed for headache.    [provider]  meclizine (ANTIVERT) 25 MG tablet Take 1 tablet (25 mg total) by mouth 3 (three) times daily as needed for dizziness. Patient not taking: Reported on 02/08/2017 07/25/16   Janne Napoleon, NP  megestrol (MEGACE) 40 MG tablet Take 2 tablets (80 mg total) by mouth 2 (two) times daily. Patient not taking: Reported on 02/08/2017 06/05/16   Marcille Buffy D, CNM  norgestimate-ethinyl estradiol (ORTHO-CYCLEN,SPRINTEC,PREVIFEM) 0.25-35 MG-MCG tablet Take 1 tablet by mouth daily. Patient not taking: Reported on 02/08/2017 10/19/16   [place]roe Mode, MD  Prenatal Multivit-Min-Fe-FA (PRENATAL VITAMINS) 0.8 MG tablet Take 1 tablet by mouth daily. Patient not taking: Reported on 10/19/2016 01/20/16   [place]roe Mode, MD    Family History Family History  Problem Relation Age of Onset  . Hypertension Mother   . Sickle cell trait Father     Social History Social History  Substance Use Topics  . Smoking status: Never Smoker  . Smokeless tobacco: Never Used  . Alcohol use No     Allergies   Latex  Review of Systems Review of Systems  Musculoskeletal: Positive for arthralgias.  All other systems reviewed and are negative.    Physical Exam Triage Vital Signs ED Triage Vitals  Enc Vitals Group     BP 05/06/17 1159 (!) 126/92     Pulse Rate 05/06/17 1159 80     Resp --      Temp 05/06/17 1159 98.6 F (37 C)     Temp Source 05/06/17 1159 Oral     SpO2 05/06/17 1159 99 %     Weight --      Height --      Head Circumference --      Peak Flow --      Pain Score 05/06/17 1201 7     Pain Loc --      Pain Edu? --      Excl. in [place]? --    No data found.   Updated Vital Signs BP (!) 126/92 (BP Location: Left Wrist)   Pulse 80   Temp 98.6 F (37 C) (Oral)   SpO2 99%   Visual Acuity Right Eye Distance:   Left Eye Distance:   Bilateral Distance:    Right Eye Near:   Left Eye Near:    Bilateral Near:      Physical Exam  Constitutional: She is oriented to person, place, and time. She appears well-developed and well-nourished.  HENT:  Right Ear: External ear normal.  Left Ear: External ear normal.  Mouth/Throat: Oropharynx is clear and moist.  Eyes: Conjunctivae are normal. Pupils are equal, round, and reactive to light.  Neck: Normal range of motion. Neck supple.  Pulmonary/Chest: Effort normal.  Musculoskeletal: She exhibits edema and tenderness. She exhibits no deformity.  Tender distal interphalangeal joint of the right thumb with moderate amount of swelling.  Neurological: She is alert and oriented to person, place, and time.  Skin: Skin is warm and dry.  Nursing note and vitals reviewed.    UC Treatments / Results  Labs (all labs ordered are listed, but only abnormal results are displayed) Labs Reviewed - No data to display  EKG  EKG Interpretation None       Radiology Dg Finger Thumb Right  Result Date: 05/06/2017 CLINICAL DATA:  Trauma. EXAM: RIGHT THUMB 2+V COMPARISON:  None. FINDINGS: No soft tissue abnormalities identified. Irregularity at the base of the proximal first phalanx along the palmar surface with a lucency and an adjacent soft tissue calcification. Given a history of acute trauma with no more remote history of trauma, I suspect this is sequela of the patient's recent injury with a fracture and mildly displaced fracture fragment. IMPRESSION: Probable subacute fracture along the palmar surface of the first proximal phalanx base. Electronically Signed   By: Dorise Bullion III M.D   On: 05/06/2017 12:34    Procedures Procedures (including critical care time)  Medications Ordered in UC Medications - No data to display   Initial Impression / Assessment and Plan / UC Course  I have reviewed the triage vital signs and the nursing notes.  Pertinent labs & imaging results that were available during my care of the patient were reviewed by me and considered in  my medical decision making (see chart for details).      Final Clinical Impressions(s) / UC Diagnoses   Final diagnoses:  Closed displaced fracture of phalanx of right thumb, unspecified phalanx, initial encounter    New Prescriptions Current Discharge Medication List    Thumb spica splint  Robyn Haber, MD 05/06/17 1244

## 2017-05-06 NOTE — Discharge Instructions (Signed)
Please call the doctor's office listed below to make an appointment

## 2017-08-30 ENCOUNTER — Ambulatory Visit (HOSPITAL_COMMUNITY)
Admission: EM | Admit: 2017-08-30 | Discharge: 2017-08-30 | Disposition: A | Discharge status: Home / Self Care | Payer: Medicaid Other | Attending: Family Medicine | Admitting: Family Medicine

## 2017-08-30 ENCOUNTER — Encounter (HOSPITAL_COMMUNITY): Payer: Self-pay | Admitting: Emergency Medicine

## 2017-08-30 DIAGNOSIS — N938 Other specified abnormal uterine and vaginal bleeding: Secondary | ICD-10-CM | POA: Insufficient documentation

## 2017-08-30 DIAGNOSIS — R6889 Other general symptoms and signs: Secondary | ICD-10-CM

## 2017-08-30 DIAGNOSIS — R1110 Vomiting, unspecified: Secondary | ICD-10-CM | POA: Insufficient documentation

## 2017-08-30 DIAGNOSIS — R197 Diarrhea, unspecified: Secondary | ICD-10-CM | POA: Insufficient documentation

## 2017-08-30 DIAGNOSIS — R112 Nausea with vomiting, unspecified: Secondary | ICD-10-CM

## 2017-08-30 DIAGNOSIS — F319 Bipolar disorder, unspecified: Secondary | ICD-10-CM | POA: Diagnosis not present

## 2017-08-30 DIAGNOSIS — Z91040 Latex allergy status: Secondary | ICD-10-CM | POA: Insufficient documentation

## 2017-08-30 DIAGNOSIS — R05 Cough: Secondary | ICD-10-CM

## 2017-08-30 DIAGNOSIS — I10 Essential (primary) hypertension: Secondary | ICD-10-CM | POA: Insufficient documentation

## 2017-08-30 DIAGNOSIS — D573 Sickle-cell trait: Secondary | ICD-10-CM | POA: Diagnosis not present

## 2017-08-30 DIAGNOSIS — Z87442 Personal history of urinary calculi: Secondary | ICD-10-CM | POA: Insufficient documentation

## 2017-08-30 DIAGNOSIS — J45909 Unspecified asthma, uncomplicated: Secondary | ICD-10-CM | POA: Insufficient documentation

## 2017-08-30 DIAGNOSIS — F209 Schizophrenia, unspecified: Secondary | ICD-10-CM | POA: Diagnosis not present

## 2017-08-30 DIAGNOSIS — M791 Myalgia: Secondary | ICD-10-CM

## 2017-08-30 DIAGNOSIS — N83209 Unspecified ovarian cyst, unspecified side: Secondary | ICD-10-CM | POA: Insufficient documentation

## 2017-08-30 DIAGNOSIS — Z3202 Encounter for pregnancy test, result negative: Secondary | ICD-10-CM

## 2017-08-30 LAB — POCT I-STAT, CHEM 8
BUN: 6 mg/dL (ref 6–20)
CHLORIDE: 101 mmol/L (ref 101–111)
CREATININE: 1 mg/dL (ref 0.44–1.00)
Calcium, Ion: 1.16 mmol/L (ref 1.15–1.40)
Glucose, Bld: 94 mg/dL (ref 65–99)
HEMATOCRIT: 41 % (ref 36.0–46.0)
Hemoglobin: 13.9 g/dL (ref 12.0–15.0)
Potassium: 3.5 mmol/L (ref 3.5–5.1)
Sodium: 139 mmol/L (ref 135–145)
TCO2: 29 mmol/L (ref 22–32)

## 2017-08-30 LAB — CBC
HCT: 38.1 % (ref 36.0–46.0)
Hemoglobin: 12.8 g/dL (ref 12.0–15.0)
MCH: 26.7 pg (ref 26.0–34.0)
MCHC: 33.6 g/dL (ref 30.0–36.0)
MCV: 79.4 fL (ref 78.0–100.0)
Platelets: 272 10*3/uL (ref 150–400)
RBC: 4.8 MIL/uL (ref 3.87–5.11)
RDW: 13 % (ref 11.5–15.5)
WBC: 10.4 10*3/uL (ref 4.0–10.5)

## 2017-08-30 LAB — POCT URINALYSIS DIP (DEVICE)
Bilirubin Urine: NEGATIVE
Glucose, UA: NEGATIVE mg/dL
KETONES UR: NEGATIVE mg/dL
LEUKOCYTES UA: NEGATIVE
Nitrite: NEGATIVE
Protein, ur: NEGATIVE mg/dL
Specific Gravity, Urine: 1.01 (ref 1.005–1.030)
UROBILINOGEN UA: 0.2 mg/dL (ref 0.0–1.0)
pH: 6 (ref 5.0–8.0)

## 2017-08-30 LAB — POCT PREGNANCY, URINE: Preg Test, Ur: NEGATIVE

## 2017-08-30 MED ORDER — ACETAMINOPHEN 325 MG PO TABS
ORAL_TABLET | ORAL | Status: AC
  Filled 2017-08-30: qty 2

## 2017-08-30 MED ORDER — BENZONATATE 100 MG PO CAPS: 100.0000 mg | ORAL_CAPSULE | Freq: Three times a day (TID) | ORAL | 0 refills | Status: DC | PRN

## 2017-08-30 MED ORDER — ONDANSETRON 4 MG PO TBDP
ORAL_TABLET | ORAL | Status: AC
  Filled 2017-08-30: qty 1

## 2017-08-30 MED ORDER — ONDANSETRON 8 MG PO TBDP: 8.0000 mg | ORAL_TABLET | Freq: Three times a day (TID) | ORAL | 0 refills | Status: DC | PRN

## 2017-08-30 MED ORDER — SODIUM CHLORIDE 0.9 % IV BOLUS (SEPSIS)
1000.0000 mL | Freq: Once | INTRAVENOUS | Status: AC
  Administered 2017-08-30: 1000 mL via INTRAVENOUS

## 2017-08-30 MED ORDER — ACETAMINOPHEN 325 MG PO TABS
650.0000 mg | ORAL_TABLET | Freq: Once | ORAL | Status: AC
  Administered 2017-08-30: 650 mg via ORAL

## 2017-08-30 MED ORDER — ONDANSETRON 4 MG PO TBDP
4.0000 mg | ORAL_TABLET | Freq: Once | ORAL | Status: AC
  Administered 2017-08-30: 4 mg via ORAL

## 2017-08-30 NOTE — Discharge Instructions (Signed)
Please return if symptoms do not subside over next 1-2 days

## 2017-08-30 NOTE — ED Triage Notes (Signed)
PT reports vomiting, chills, diarrhea, body aches, and cough that started yesterday.

## 2017-08-30 NOTE — ED Provider Notes (Signed)
[place]   [id number] 08/30/17 Arrival Time: 44   SUBJECTIVE:  Kathryn Suarez is a 31 y.o. female who presents to the urgent care with complaint of  vomiting, chills, diarrhea, body aches, and cough that started yesterday.   Patient vomited in the waiting room.  Patient has a 19-year-old son who is not ill at this point.     Past Medical History:  Diagnosis Date  . Anemia   . Asthma   . Bipolar disorder ([place])   . Blood transfusion without reported diagnosis    pt has had more than 10 transfusions  . Chronic pelvic pain in female   . DUB (dysfunctional uterine bleeding)   . Hypertension   . Kidney stones   . Ovarian cyst   . Schizophrenia ([place])   . Sickle cell trait ([facility])    Family History  Problem Relation Age of Onset  . Hypertension Mother   . Sickle cell trait Father    Social History   Social History  . Marital status: Single    Spouse name: N/A  . Number of children: N/A  . Years of education: N/A   Occupational History  . Not on file.   Social History Main Topics  . Smoking status: Never Smoker  . Smokeless tobacco: Never Used  . Alcohol use No  . Drug use: Yes    Types: Other-see comments, Oxycodone     Comment: Chronic pain pain on percocet: managed by [facility]  . Sexual activity: Yes    Birth control/ protection: None, IUD     Comment: with a woman   Other Topics Concern  . Not on file   Social History Narrative  . No narrative on file   No outpatient prescriptions have been marked as taking for the 08/30/17 encounter [facility] Encounter).   Allergies  Allergen Reactions  . Latex Rash      ROS: As per HPI, remainder of ROS negative.   OBJECTIVE:   Vitals:   08/30/17 1120 08/30/17 1121  BP:  120/87  Pulse:  96  Resp:  16  Temp:  99.9 F (37.7 C)  TempSrc:  Oral  SpO2:  97%  Weight: 240 lb (108.9 kg)   Height: 5\' 3"  (1.6 m)      General appearance: alert; no distress Eyes: PERRL; EOMI;  conjunctiva normal HENT: normocephalic; atraumatic; TMs normal, canal normal, external ears normal without trauma; nasal mucosa normal; oral mucosa normal Neck: supple Lungs: clear to auscultation bilaterally, but she has a very congested cough Heart: regular rate and rhythm Abdomen: soft, non-tender; bowel sounds normal; no masses or organomegaly; no guarding or rebound tenderness Back: no CVA tenderness Extremities: no cyanosis or edema; symmetrical with no gross deformities Skin: warm and dry Neurologic: normal gait; grossly normal Psychological: alert and cooperative; normal mood and affect      Labs:  Results for orders placed or performed during the hospital encounter of 08/30/17  CBC  Result Value Ref Range   WBC 10.4 4.0 - 10.5 K/uL   RBC 4.80 3.87 - 5.11 MIL/uL   Hemoglobin 12.8 12.0 - 15.0 g/dL   HCT 38.1 36.0 - 46.0 %   MCV 79.4 78.0 - 100.0 fL   MCH 26.7 26.0 - 34.0 pg   MCHC 33.6 30.0 - 36.0 g/dL   RDW 13.0 11.5 - 15.5 %   Platelets 272 150 - 400 K/uL  POCT urinalysis dip (device)  Result Value Ref Range   Glucose,  UA NEGATIVE NEGATIVE mg/dL   Bilirubin Urine NEGATIVE NEGATIVE   Ketones, ur NEGATIVE NEGATIVE mg/dL   Specific Gravity, Urine 1.010 1.005 - 1.030   Hgb urine dipstick TRACE (A) NEGATIVE   pH 6.0 5.0 - 8.0   Protein, ur NEGATIVE NEGATIVE mg/dL   Urobilinogen, UA 0.2 0.0 - 1.0 mg/dL   Nitrite NEGATIVE NEGATIVE   Leukocytes, UA NEGATIVE NEGATIVE  Pregnancy, urine POC  Result Value Ref Range   Preg Test, Ur NEGATIVE NEGATIVE  I-STAT, chem 8  Result Value Ref Range   Sodium 139 135 - 145 mmol/L   Potassium 3.5 3.5 - 5.1 mmol/L   Chloride 101 101 - 111 mmol/L   BUN 6 6 - 20 mg/dL   Creatinine, Ser 1.00 0.44 - 1.00 mg/dL   Glucose, Bld 94 65 - 99 mg/dL   Calcium, Ion 1.16 1.15 - 1.40 mmol/L   TCO2 29 22 - 32 mmol/L   Hemoglobin 13.9 12.0 - 15.0 g/dL   HCT 41.0 36.0 - 46.0 %    Labs Reviewed  POCT URINALYSIS DIP (DEVICE) - Abnormal; Notable  for the following:       Result Value   Hgb urine dipstick TRACE (*)    All other components within normal limits  CBC  POCT PREGNANCY, URINE  POCT I-STAT, CHEM 8     ASSESSMENT & PLAN:  1. Flu-like symptoms     Meds ordered this encounter  Medications  . ondansetron (ZOFRAN-ODT) disintegrating tablet 4 mg  . sodium chloride 0.9 % bolus 1,000 mL  . acetaminophen (TYLENOL) tablet 650 mg  . benzonatate (TESSALON) 100 MG capsule    Sig: Take 1-2 capsules (100-200 mg total) by mouth 3 (three) times daily as needed for cough.    Dispense:  40 capsule    Refill:  0  . ondansetron (ZOFRAN-ODT) 8 MG disintegrating tablet    Sig: Take 1 tablet (8 mg total) by mouth every 8 (eight) hours as needed for nausea.    Dispense:  12 tablet    Refill:  0    Reviewed expectations re: course of current medical issues. Questions answered. Outlined signs and symptoms indicating need for more acute intervention. Patient verbalized understanding. After Visit Summary given.    Procedures:  IV fluids and po Zofran      Robyn Haber, MD 08/30/17 1237

## 2017-08-30 NOTE — ED Notes (Signed)
Patient requesting something for boydaches and headache, MD informed

## 2017-08-30 NOTE — ED Notes (Signed)
Patient states she is still feeling nauseas, emesis bag given. Lights off for comfort. Patient aware of POC.

## 2017-11-15 ENCOUNTER — Ambulatory Visit (INDEPENDENT_AMBULATORY_CARE_PROVIDER_SITE_OTHER): Payer: Medicaid Other | Admitting: Advanced Practice Midwife

## 2017-11-15 ENCOUNTER — Ambulatory Visit: Payer: Medicaid Other

## 2017-11-15 ENCOUNTER — Encounter: Payer: Self-pay | Admitting: Advanced Practice Midwife

## 2017-11-15 VITALS — BP 115/65 | HR 87 | Ht 63.0 in | Wt 259.2 lb

## 2017-11-15 DIAGNOSIS — R102 Pelvic and perineal pain: Secondary | ICD-10-CM

## 2017-11-15 DIAGNOSIS — N643 Galactorrhea not associated with childbirth: Secondary | ICD-10-CM

## 2017-11-15 NOTE — Patient Instructions (Signed)
Endometriosis Endometriosis is a condition in which the tissue that lines the uterus (endometrium) grows outside of its normal location. The tissue may grow in many locations close to the uterus, but it commonly grows on the ovaries, fallopian tubes, vagina, or bowel. When the uterus sheds the endometrium every menstrual cycle, there is bleeding wherever the endometrial tissue is located. This can cause pain because blood is irritating to tissues that are not normally exposed to it. What are the causes? The cause of endometriosis is not known. What increases the risk? You may be more likely to develop endometriosis if you:  Have a family history of endometriosis.  Have never given birth.  Started your period at age 10 or younger.  Have high levels of estrogen in your body.  Were exposed to a certain medicine (diethylstilbestrol) before you were born (in utero).  Had low birth weight.  Were born as a twin, triplet, or other multiple.  Have a BMI of less than 25. BMI is an estimate of body fat and is calculated from height and weight.  What are the signs or symptoms? Often, there are no symptoms of this condition. If you do have symptoms, they may:  Vary depending on where your endometrial tissue is growing.  Occur during your menstrual period (most common) or midcycle.  Come and go, or you may go months with no symptoms at all.  Stop with menopause.  Symptoms may include:  Pain in the back or abdomen.  Heavier bleeding during periods.  Pain during sex.  Painful bowel movements.  Infertility.  Pelvic pain.  Bleeding more than once a month.  How is this diagnosed? This condition is diagnosed based on your symptoms and a physical exam. You may have tests, such as:  Blood tests and urine tests. These may be done to help rule out other possible causes of your symptoms.  Ultrasound, to look for abnormal tissues.  An X-ray of the lower bowel (barium enema).  An  ultrasound that is done through the vagina (transvaginally).  CT scan.  MRI.  Laparoscopy. In this procedure, a lighted, pencil-sized instrument called a laparoscope is inserted into your abdomen through an incision. The laparoscope allows your health care provider to look at the organs inside your body and check for abnormal tissue to confirm the diagnosis. If abnormal tissue is found, your health care provider may remove a small piece of tissue (biopsy) to be examined under a microscope.  How is this treated? Treatment for this condition may include:  Medicines to relieve pain, such as NSAIDs.  Hormone therapy. This involves using artificial (synthetic) hormones to reduce endometrial tissue growth. Your health care provider may recommend using a hormonal form of birth control, or other medicines.  Surgery. This may be done to remove abnormal endometrial tissue. ? In some cases, tissue may be removed using a laparoscope and a laser (laparoscopic laser treatment). ? In severe cases, surgery may be done to remove the fallopian tubes, uterus, and ovaries (hysterectomy).  Follow these instructions at home:  Take over-the-counter and prescription medicines only as told by your health care provider.  Do not drive or use heavy machinery while taking prescription pain medicine.  Try to avoid activities that cause pain, including sexual activity.  Keep all follow-up visits as told by your health care provider. This is important. Contact a health care provider if:  You have pain in the area between your hip bones (pelvic area) that occurs: ? Before, during, or   after your period. ? In between your period and gets worse during your period. ? During or after sex. ? With bowel movements or urination, especially during your period.  You have problems getting pregnant.  You have a fever. Get help right away if:  You have severe pain that does not get better with medicine.  You have severe  nausea and vomiting, or you cannot eat without vomiting.  You have pain that affects only the lower, right side of your abdomen.  You have abdominal pain that gets worse.  You have abdominal swelling.  You have blood in your stool. This information is not intended to replace advice given to you by your health care provider. Make sure you discuss any questions you have with your health care provider. Document Released: 11/27/2000 Document Revised: 09/04/2016 Document Reviewed: 05/02/2016 Elsevier Interactive Patient Education  2018 [employer]  

## 2017-11-15 NOTE — Progress Notes (Signed)
Subjective:     Patient ID: Kathryn Suarez, female   DOB: 04-Nov-1986, 31 y.o.   MRN: [id number]  Pelvic Pain  The patient's primary symptoms include pelvic pain. The patient's pertinent negatives include no genital itching, genital lesions, genital odor, vaginal bleeding or vaginal discharge. This is a chronic problem. The current episode started more than 1 year ago. The problem occurs every several days. The problem has been gradually worsening. The pain is severe. The problem affects both sides. She is not pregnant. Associated symptoms include abdominal pain, back pain, headaches and painful intercourse. Pertinent negatives include no chills, constipation, diarrhea, dysuria, fever, frequency, hematuria, joint pain, nausea or vomiting. The symptoms are aggravated by intercourse and tactile pressure. She has tried NSAIDs, oral narcotics and acetaminophen for the symptoms. The treatment provided mild relief. She is sexually active. No, her partner does not have an STD. She uses an IUD for contraception. Her menstrual history has been regular. Her past medical history is significant for endometriosis, a gynecological surgery and ovarian cysts. There is no history of an abdominal surgery, herpes simplex, miscarriage or an STD.   Galactorrhea  Patient is seen today for recurrent nipple discharge. Was seen in the office in February and has received a mammogram that was normal. Patient denies heavy nipple stimulation- states she has IC 5 times a year due to the pelvic pain and has white/milky discharge from bilateral breast randomly even w/o stimulation. She is not pregnant- never been pregnant and has an IUD for [facility].   Review of Systems  Constitutional: Negative for appetite change, chills, diaphoresis, fever and unexpected weight change.  Respiratory: Negative for cough, shortness of breath and wheezing.   Gastrointestinal: Positive for abdominal pain. Negative for constipation, diarrhea, nausea and  vomiting.  Genitourinary: Positive for pelvic pain. Negative for dysuria, frequency, hematuria and vaginal discharge.  Musculoskeletal: Positive for back pain. Negative for joint pain.  Neurological: Positive for headaches. Negative for weakness and light-headedness.  Psychiatric/Behavioral: Negative for agitation, behavioral problems and confusion.       Objective:   Physical Exam  Constitutional: She is oriented to person, place, and time. She appears well-developed and well-nourished.  Cardiovascular: Normal rate, regular rhythm and normal heart sounds.  Pulmonary/Chest: Effort normal and breath sounds normal. No respiratory distress. She has no wheezes. Right breast exhibits nipple discharge (white/milky discharge expelled from bilateral breast with light pressure and no stimulation ). Right breast exhibits no inverted nipple and no mass. Left breast exhibits nipple discharge. Left breast exhibits no inverted nipple and no mass.  Abdominal: Soft. Bowel sounds are normal. She exhibits no distension. There is no tenderness.  Genitourinary:  Genitourinary Comments: Pelvic exam refused by patient due to pelvic pain- plans to wait until pelvic US so that she does not have to get exam twice    Neurological: She is alert and oriented to person, place, and time.  Skin: Skin is warm and dry.  Psychiatric: She has a normal mood and affect. Her behavior is normal.  Vitals reviewed.      Assessment:     1. Galactorrhea - Prolactin - TSH  2. Pelvic pain in female - US Pelvis Complete; Future - US Transvaginal Non-OB; Future     Plan:     -Receive records from GYN surgery for endometriosis by Dr Coolidge Breeze in [place] in 2009- record release form signed -Pelvic US on Friday   -referral to Endocrinology for galactorrhea     Follow up in 2 weeks  with Dr Hulan Fray for endometriosis management   Darrol Poke CNM 11/15/17

## 2017-11-16 LAB — PROLACTIN: Prolactin: 16.2 ng/mL (ref 4.8–23.3)

## 2017-11-16 LAB — TSH: TSH: 1.69 u[IU]/mL (ref 0.450–4.500)

## 2017-11-18 ENCOUNTER — Ambulatory Visit (HOSPITAL_COMMUNITY): Payer: Medicaid Other

## 2017-11-19 ENCOUNTER — Ambulatory Visit (HOSPITAL_COMMUNITY)
Admission: RE | Admit: 2017-11-19 | Discharge: 2017-11-19 | Disposition: A | Discharge status: Home / Self Care | Payer: Medicaid Other | Source: Ambulatory Visit | Attending: Certified Nurse Midwife | Admitting: Certified Nurse Midwife

## 2017-11-19 DIAGNOSIS — R102 Pelvic and perineal pain: Secondary | ICD-10-CM | POA: Diagnosis not present

## 2017-11-19 DIAGNOSIS — G8929 Other chronic pain: Secondary | ICD-10-CM | POA: Diagnosis present

## 2017-11-24 ENCOUNTER — Telehealth: Payer: Self-pay

## 2017-11-24 NOTE — Telephone Encounter (Signed)
Per Marcille Buffy, CNM, please let patient know that her ultrasound was normal, IUD is in the correct place. Have her keep her appt with Dr. Hulan Fray for further management. Called patient to inform that her [place] results was normal. Advised patient to keep her appointment with Dr. Hulan Fray scheduled for 11/30/17. Patient verbalized understanding.

## 2017-11-30 ENCOUNTER — Ambulatory Visit: Payer: Medicaid Other | Admitting: Obstetrics & Gynecology

## 2017-11-30 VITALS — BP 124/81 | HR 91 | Wt 260.0 lb

## 2017-11-30 DIAGNOSIS — R102 Pelvic and perineal pain: Secondary | ICD-10-CM | POA: Diagnosis present

## 2017-11-30 NOTE — Progress Notes (Signed)
Patient ID: Kathryn Suarez, female   DOB: April 11, 1986, 31 y.o.   MRN: [id number]  Chief Complaint  Patient presents with  . Follow-up    u/s results and plan    HPI Kathryn Suarez is a 31 y.o. female.  Engaged P0 (adopted 103 yo son) here today with the issue of chronic pelvic pain and irregular bleeding since age 8. She had a laparoscopy by Dr. Coolidge Breeze in [place] about 8 years ago and was told that she has PCOS and endometriosis. She has tried OCPs, Mirena (on her second one), depo provera.  Her current Mirena was placed 9/14. She had a normal gyn u/s 12/18.  HPI  Past Medical History:  Diagnosis Date  . Anemia   . Asthma   . Bipolar disorder ([place])   . Blood transfusion without reported diagnosis    pt has had more than 10 transfusions  . Chronic pelvic pain in female   . DUB (dysfunctional uterine bleeding)   . Hypertension   . Kidney stones   . Ovarian cyst   . Schizophrenia ([place])   . Sickle cell trait [facility])     Past Surgical History:  Procedure Laterality Date  . DILATION AND CURETTAGE OF UTERUS    . EXCISION OF TONGUE LESION    . LAPAROSCOPY ABDOMEN DIAGNOSTIC N/A     Family History  Problem Relation Age of Onset  . Hypertension Mother   . Sickle cell trait Father     Social History Social History   Tobacco Use  . Smoking status: Never Smoker  . Smokeless tobacco: Never Used  Substance Use Topics  . Alcohol use: No  . Drug use: Yes    Types: Other-see comments, Oxycodone    Comment: Chronic pain pain on percocet: managed by [facility]    Allergies  Allergen Reactions  . Latex Rash    Current Outpatient Medications  Medication Sig Dispense Refill  . acetaminophen (TYLENOL) 500 MG tablet Take 500 mg by mouth every 6 (six) hours as needed.    . benzonatate (TESSALON) 100 MG capsule Take 1-2 capsules (100-200 mg total) by mouth 3 (three) times daily as needed for cough. (Patient not taking: Reported on 11/15/2017) 40 capsule 0  . ibuprofen  (ADVIL,MOTRIN) 200 MG tablet Take 200 mg by mouth every 6 (six) hours as needed for headache.    . ondansetron (ZOFRAN-ODT) 8 MG disintegrating tablet Take 1 tablet (8 mg total) by mouth every 8 (eight) hours as needed for nausea. (Patient not taking: Reported on 11/15/2017) 12 tablet 0  . oxyCODONE-acetaminophen (PERCOCET) 5-325 MG per tablet Take 1 tablet by mouth every 6 (six) hours as needed. Reported on 01/20/2016     No current facility-administered medications for this visit.     Review of Systems Review of Systems She is monogamous for the last 8 years, same sex relationship She is a travel agent, works from home  Blood pressure 124/81, pulse 91, weight 260 lb (117.9 kg).  Physical Exam Physical Exam Breathing, conversing, and ambulating normally Well nourished, well hydrated Black female, no apparent distress abd- benign, morbidly obese She declines a vaginal exam  Data Reviewed Diagnosis NEGATIVE FOR INTRAEPITHELIAL LESIONS OR MALIGNANCY. FUNGAL ORGANISMS PRESENT CONSISTENT WITH CANDIDA.  Assessment    Chronic pelvic pain with h/o l/s and diagnosis of endometriosis. I have offered to repeat dx lap and remove endo prn. She would like this. I will send an email to [facility] to schedule it.  Plan    As above       [place] 11/30/2017, 9:27 AM

## 2017-12-01 ENCOUNTER — Encounter (HOSPITAL_COMMUNITY): Payer: Self-pay

## 2017-12-02 ENCOUNTER — Other Ambulatory Visit: Payer: Self-pay

## 2017-12-02 ENCOUNTER — Encounter (HOSPITAL_BASED_OUTPATIENT_CLINIC_OR_DEPARTMENT_OTHER): Payer: Self-pay | Admitting: *Deleted

## 2017-12-02 NOTE — Pre-Procedure Instructions (Signed)
To come for anesthesia airway evaluation and possible labs.

## 2017-12-08 ENCOUNTER — Ambulatory Visit (HOSPITAL_BASED_OUTPATIENT_CLINIC_OR_DEPARTMENT_OTHER)
Admission: RE | Admit: 2017-12-08 | Payer: Medicaid Other | Source: Ambulatory Visit | Admitting: Obstetrics & Gynecology

## 2017-12-08 HISTORY — DX: Rash and other nonspecific skin eruption: R21

## 2017-12-08 SURGERY — LAPAROSCOPY, DIAGNOSTIC
Anesthesia: Choice

## 2017-12-20 ENCOUNTER — Encounter (HOSPITAL_COMMUNITY): Payer: Self-pay

## 2018-01-14 ENCOUNTER — Ambulatory Visit: Payer: Medicaid Other | Admitting: Endocrinology

## 2018-03-02 ENCOUNTER — Encounter (HOSPITAL_BASED_OUTPATIENT_CLINIC_OR_DEPARTMENT_OTHER): Payer: Self-pay | Admitting: *Deleted

## 2018-03-09 ENCOUNTER — Encounter (HOSPITAL_BASED_OUTPATIENT_CLINIC_OR_DEPARTMENT_OTHER): Payer: Self-pay

## 2018-03-09 ENCOUNTER — Ambulatory Visit (HOSPITAL_BASED_OUTPATIENT_CLINIC_OR_DEPARTMENT_OTHER): Admit: 2018-03-09 | Payer: Medicaid Other | Admitting: Obstetrics & Gynecology

## 2018-03-09 SURGERY — LAPAROSCOPY, DIAGNOSTIC
Anesthesia: Choice

## 2018-03-14 ENCOUNTER — Encounter (HOSPITAL_COMMUNITY): Payer: Self-pay

## 2018-05-04 ENCOUNTER — Telehealth: Payer: Self-pay | Admitting: Obstetrics & Gynecology

## 2018-05-04 NOTE — Telephone Encounter (Signed)
Pt request a letter stating she is scheduled for surgery on 06/01/18. Pt new mailing address so it can be mailed or call pt and she can pick it up.

## 2018-05-05 ENCOUNTER — Encounter: Payer: Self-pay | Admitting: *Deleted

## 2018-05-05 NOTE — Telephone Encounter (Signed)
I called Kathryn Suarez and informed her I can prepare the letter; but she must come to the office to pick up at the front desk and they will print it and give it to her. She voices understanding.

## 2018-05-25 ENCOUNTER — Encounter (HOSPITAL_BASED_OUTPATIENT_CLINIC_OR_DEPARTMENT_OTHER): Payer: Self-pay | Admitting: *Deleted

## 2018-05-25 ENCOUNTER — Other Ambulatory Visit: Payer: Self-pay

## 2018-06-01 ENCOUNTER — Ambulatory Visit (HOSPITAL_BASED_OUTPATIENT_CLINIC_OR_DEPARTMENT_OTHER)
Admission: RE | Admit: 2018-06-01 | Discharge: 2018-06-01 | Disposition: A | Discharge status: Home / Self Care | Payer: Medicaid Other | Source: Ambulatory Visit | Attending: Obstetrics & Gynecology | Admitting: Obstetrics & Gynecology

## 2018-06-01 ENCOUNTER — Telehealth: Payer: Self-pay

## 2018-06-01 ENCOUNTER — Encounter (HOSPITAL_BASED_OUTPATIENT_CLINIC_OR_DEPARTMENT_OTHER): Payer: Self-pay | Admitting: Anesthesiology

## 2018-06-01 ENCOUNTER — Encounter (HOSPITAL_BASED_OUTPATIENT_CLINIC_OR_DEPARTMENT_OTHER)
Admission: RE | Disposition: A | Discharge status: Home / Self Care | Payer: Self-pay | Source: Ambulatory Visit | Attending: Obstetrics & Gynecology

## 2018-06-01 ENCOUNTER — Ambulatory Visit (HOSPITAL_BASED_OUTPATIENT_CLINIC_OR_DEPARTMENT_OTHER): Payer: Medicaid Other | Admitting: Anesthesiology

## 2018-06-01 ENCOUNTER — Other Ambulatory Visit: Payer: Self-pay

## 2018-06-01 DIAGNOSIS — I10 Essential (primary) hypertension: Secondary | ICD-10-CM | POA: Insufficient documentation

## 2018-06-01 DIAGNOSIS — N803 Endometriosis of pelvic peritoneum: Secondary | ICD-10-CM | POA: Insufficient documentation

## 2018-06-01 DIAGNOSIS — Z79891 Long term (current) use of opiate analgesic: Secondary | ICD-10-CM | POA: Insufficient documentation

## 2018-06-01 DIAGNOSIS — R102 Pelvic and perineal pain: Secondary | ICD-10-CM | POA: Diagnosis not present

## 2018-06-01 HISTORY — DX: Unspecified asthma, uncomplicated: J45.909

## 2018-06-01 HISTORY — PX: LAPAROSCOPY: SHX197

## 2018-06-01 LAB — POCT HEMOGLOBIN-HEMACUE: HEMOGLOBIN: 13 g/dL (ref 12.0–15.0)

## 2018-06-01 LAB — POCT PREGNANCY, URINE: Preg Test, Ur: NEGATIVE

## 2018-06-01 SURGERY — LAPAROSCOPY, DIAGNOSTIC
Anesthesia: General | Site: Abdomen

## 2018-06-01 MED ORDER — LACTATED RINGERS IV SOLN
INTRAVENOUS | Status: DC
  Administered 2018-06-01 (×3): via INTRAVENOUS

## 2018-06-01 MED ORDER — IBUPROFEN 600 MG PO TABS: 600.0000 mg | ORAL_TABLET | Freq: Four times a day (QID) | ORAL | 1 refills | Status: DC | PRN

## 2018-06-01 MED ORDER — SUGAMMADEX SODIUM 200 MG/2ML IV SOLN
INTRAVENOUS | Status: DC | PRN
  Administered 2018-06-01: 200 mg via INTRAVENOUS

## 2018-06-01 MED ORDER — SCOPOLAMINE 1 MG/3DAYS TD PT72: 1.0000 | MEDICATED_PATCH | Freq: Once | TRANSDERMAL | Status: DC | PRN

## 2018-06-01 MED ORDER — DEXAMETHASONE SODIUM PHOSPHATE 4 MG/ML IJ SOLN
INTRAMUSCULAR | Status: DC | PRN
  Administered 2018-06-01: 10 mg via INTRAVENOUS

## 2018-06-01 MED ORDER — ONDANSETRON HCL 4 MG/2ML IJ SOLN
INTRAMUSCULAR | Status: DC | PRN
  Administered 2018-06-01: 4 mg via INTRAVENOUS

## 2018-06-01 MED ORDER — ONDANSETRON HCL 4 MG/2ML IJ SOLN
INTRAMUSCULAR | Status: AC
  Filled 2018-06-01: qty 2

## 2018-06-01 MED ORDER — OXYCODONE-ACETAMINOPHEN 5-325 MG PO TABS: 1.0000 | ORAL_TABLET | ORAL | 0 refills | Status: DC | PRN

## 2018-06-01 MED ORDER — BUPIVACAINE HCL (PF) 0.5 % IJ SOLN
INTRAMUSCULAR | Status: AC
  Filled 2018-06-01: qty 60

## 2018-06-01 MED ORDER — DEXAMETHASONE SODIUM PHOSPHATE 10 MG/ML IJ SOLN
INTRAMUSCULAR | Status: AC
  Filled 2018-06-01: qty 1

## 2018-06-01 MED ORDER — PROPOFOL 10 MG/ML IV BOLUS
INTRAVENOUS | Status: DC | PRN
  Administered 2018-06-01: 200 mg via INTRAVENOUS

## 2018-06-01 MED ORDER — FENTANYL CITRATE (PF) 100 MCG/2ML IJ SOLN: 25.0000 ug | INTRAMUSCULAR | Status: DC | PRN

## 2018-06-01 MED ORDER — FENTANYL CITRATE (PF) 100 MCG/2ML IJ SOLN
INTRAMUSCULAR | Status: AC
  Filled 2018-06-01: qty 2

## 2018-06-01 MED ORDER — MIDAZOLAM HCL 2 MG/2ML IJ SOLN
INTRAMUSCULAR | Status: AC
  Filled 2018-06-01: qty 2

## 2018-06-01 MED ORDER — BUPIVACAINE HCL (PF) 0.5 % IJ SOLN
INTRAMUSCULAR | Status: DC | PRN
  Administered 2018-06-01: 20 mL

## 2018-06-01 MED ORDER — MIDAZOLAM HCL 2 MG/2ML IJ SOLN
1.0000 mg | INTRAMUSCULAR | Status: DC | PRN
  Administered 2018-06-01: 2 mg via INTRAVENOUS

## 2018-06-01 MED ORDER — LIDOCAINE HCL (CARDIAC) PF 100 MG/5ML IV SOSY
PREFILLED_SYRINGE | INTRAVENOUS | Status: DC | PRN
  Administered 2018-06-01: 100 mg via INTRAVENOUS

## 2018-06-01 MED ORDER — ROCURONIUM BROMIDE 100 MG/10ML IV SOLN
INTRAVENOUS | Status: DC | PRN
  Administered 2018-06-01: 50 mg via INTRAVENOUS

## 2018-06-01 MED ORDER — FENTANYL CITRATE (PF) 100 MCG/2ML IJ SOLN
50.0000 ug | INTRAMUSCULAR | Status: DC | PRN
  Administered 2018-06-01: 100 ug via INTRAVENOUS

## 2018-06-01 SURGICAL SUPPLY — 41 items
ADH SKN CLS APL DERMABOND .7 (GAUZE/BANDAGES/DRESSINGS) ×1
BAG SPEC RTRVL LRG 6X4 10 (ENDOMECHANICALS)
BRIEF STRETCH FOR OB PAD XXL (UNDERPADS AND DIAPERS) ×3 IMPLANT
CABLE HIGH FREQUENCY MONO STRZ (ELECTRODE) IMPLANT
DERMABOND ADVANCED (GAUZE/BANDAGES/DRESSINGS) ×2
DERMABOND ADVANCED .7 DNX12 (GAUZE/BANDAGES/DRESSINGS) IMPLANT
DRSG OPSITE POSTOP 3X4 (GAUZE/BANDAGES/DRESSINGS) IMPLANT
DURAPREP 26ML APPLICATOR (WOUND CARE) ×3 IMPLANT
ELECT REM PT RETURN 9FT ADLT (ELECTROSURGICAL) ×3
ELECTRODE REM PT RTRN 9FT ADLT (ELECTROSURGICAL) IMPLANT
GLOVE BIO SURGEON STRL SZ 6.5 (GLOVE) ×1 IMPLANT
GLOVE BIO SURGEONS STRL SZ 6.5 (GLOVE)
GLOVE BIOGEL PI IND STRL 7.0 (GLOVE) ×2 IMPLANT
GLOVE BIOGEL PI INDICATOR 7.0 (GLOVE) ×8
GLOVE SURG SS PI 6.5 STRL IVOR (GLOVE) ×4 IMPLANT
GLOVE SURG SS PI 7.0 STRL IVOR (GLOVE) ×2 IMPLANT
GOWN STRL REUS W/ TWL XL LVL3 (GOWN DISPOSABLE) ×1 IMPLANT
GOWN STRL REUS W/TWL LRG LVL3 (GOWN DISPOSABLE) ×3 IMPLANT
GOWN STRL REUS W/TWL XL LVL3 (GOWN DISPOSABLE) ×3
IV NS IRRIG 3000ML ARTHROMATIC (IV SOLUTION) IMPLANT
NEEDLE INSUFFLATION 120MM (ENDOMECHANICALS) ×3 IMPLANT
NS IRRIG 1000ML POUR BTL (IV SOLUTION) ×3 IMPLANT
PACK LAPAROSCOPY BASIN (CUSTOM PROCEDURE TRAY) ×3 IMPLANT
PACK TRENDGUARD 450 HYBRID PRO (MISCELLANEOUS) IMPLANT
PACK TRENDGUARD 600 HYBRD PROC (MISCELLANEOUS) IMPLANT
PAD OB MATERNITY 4.3X12.25 (PERSONAL CARE ITEMS) ×3 IMPLANT
PAD PREP 24X48 CUFFED NSTRL (MISCELLANEOUS) ×3 IMPLANT
POUCH SPECIMEN RETRIEVAL 10MM (ENDOMECHANICALS) IMPLANT
SET IRRIG TUBING LAPAROSCOPIC (IRRIGATION / IRRIGATOR) IMPLANT
SHEARS HARMONIC ACE PLUS 36CM (ENDOMECHANICALS) IMPLANT
SLEEVE SCD COMPRESS KNEE MED (MISCELLANEOUS) ×3 IMPLANT
SLEEVE XCEL OPT CAN 5 100 (ENDOMECHANICALS) ×2 IMPLANT
SUT VICRYL 0 UR6 27IN ABS (SUTURE) IMPLANT
SUT VICRYL 4-0 PS2 18IN ABS (SUTURE) IMPLANT
TOWEL GREEN STERILE FF (TOWEL DISPOSABLE) ×6 IMPLANT
TRENDGUARD 450 HYBRID PRO PACK (MISCELLANEOUS) ×3
TRENDGUARD 600 HYBRID PROC PK (MISCELLANEOUS)
TROCAR OPTI TIP 5M 100M (ENDOMECHANICALS) ×3 IMPLANT
TROCAR XCEL NON-BLD 11X100MML (ENDOMECHANICALS) IMPLANT
TUBING INSUFFLATION (TUBING) ×3 IMPLANT
WARMER LAPAROSCOPE (MISCELLANEOUS) ×3 IMPLANT

## 2018-06-01 NOTE — Anesthesia Postprocedure Evaluation (Signed)
Anesthesia Post Note  Patient: Kathryn Suarez  Procedure(s) Performed: LAPAROSCOPY DIAGNOSTIC, PERITONEAL BIOPSY (N/A Abdomen)     Patient location during evaluation: PACU Anesthesia Type: General Level of consciousness: awake and alert Pain management: pain level controlled Vital Signs Assessment: post-procedure vital signs reviewed and stable Respiratory status: spontaneous breathing, nonlabored ventilation, respiratory function stable and patient connected to nasal cannula oxygen Cardiovascular status: blood pressure returned to baseline and stable Postop Assessment: no apparent nausea or vomiting Anesthetic complications: no    Last Vitals:  Vitals:   06/01/18 1515 06/01/18 1545  BP: 114/70   Pulse: 73   Resp:    Temp: 37 C 37.1 C  SpO2: 100%     Last Pain:  Vitals:   06/01/18 1545  TempSrc: Oral  PainSc: 1                  [name]

## 2018-06-01 NOTE — Anesthesia Preprocedure Evaluation (Addendum)
Anesthesia Evaluation  Patient identified by MRN, date of birth, ID band Patient awake    Reviewed: Allergy & Precautions, NPO status , Patient's Chart, lab work & pertinent test results  Airway Mallampati: II  TM Distance: >3 FB     Dental   Pulmonary asthma ,    breath sounds clear to auscultation       Cardiovascular hypertension,  Rhythm:Regular Rate:Normal     Neuro/Psych    GI/Hepatic negative GI ROS,   Endo/Other  negative endocrine ROS  Renal/GU Renal disease     Musculoskeletal   Abdominal   Peds  Hematology  (+) anemia ,   Anesthesia Other Findings   Reproductive/Obstetrics                             Anesthesia Physical Anesthesia Plan  ASA: III  Anesthesia Plan: General   Post-op Pain Management:    Induction: Intravenous  PONV Risk Score and Plan: Treatment may vary due to age or medical condition  Airway Management Planned: Oral ETT  Additional Equipment:   Intra-op Plan:   Post-operative Plan: Extubation in OR  Informed Consent: I have reviewed the patients History and Physical, chart, labs and discussed the procedure including the risks, benefits and alternatives for the proposed anesthesia with the patient or authorized representative who has indicated his/her understanding and acceptance.   Dental advisory given  Plan Discussed with: CRNA and Anesthesiologist  Anesthesia Plan Comments:        Anesthesia Quick Evaluation

## 2018-06-01 NOTE — Telephone Encounter (Signed)
[facility] pharmacy called regarding oxycodone rx.  Patient recently got 7.5mg  filled and medicaid needs override to fill the 5mg .  Patient did have surgery so verbal given to get medicaid to override this time.

## 2018-06-01 NOTE — Op Note (Signed)
06/01/2018  2:27 PM  PATIENT:  Kathryn Suarez  32 y.o. female  PRE-OPERATIVE DIAGNOSIS:  Pelvic Pain  POST-OPERATIVE DIAGNOSIS:  Pelvic Pain, stage 1 endometriosis  PROCEDURE:  Procedure(s): LAPAROSCOPY DIAGNOSTIC, PERITONEAL BIOPSY (N/A)  SURGEON:  Surgeon(s) and Role:    * Brion Hedges C, MD - Primary   ASSISTANTS: none   ANESTHESIA:   local and general  EBL:  10 mL   BLOOD ADMINISTERED:none  DRAINS: none   LOCAL MEDICATIONS USED:  MARCAINE     SPECIMEN:  Source of Specimen:  peritoneal biopsies  DISPOSITION OF SPECIMEN:  PATHOLOGY  COUNTS:  YES  TOURNIQUET:  * No tourniquets in log *  DICTATION: .Dragon Dictation  PLAN OF CARE: Discharge to home after PACU  PATIENT DISPOSITION:  PACU - hemodynamically stable.   Delay start of Pharmacological VTE agent (>24hrs) due to surgical blood loss or risk of bleeding: not applicable  The risks, benefits, and alternatives of surgery were explained, understood, accepted. In the operating room she was placed in the dorsal lithotomy position, and general anesthesia was given without complication. Her abdomen and vagina were prepped and draped in the usual sterile fashion. A timeout procedure was done. A bimanual exam revealed a small anteverted and mobile uterus. Her adnexa felt normal. A Hulka manipulator was placed. Her bladder was emptied with a latex free catheter. Gloves were changed, and attention was turned to the abdomen. Approximately the 10 mL of 0.5% Marcaine was injected into the left upper quadrant. A vertical incision was made at the site. A varies needle was placed intraperitoneally. Low-flow CO2 was used to insufflate the abdomen to approximately 3-1/2 L. Once a good pneumoperitoneum was established, a 5 mm Excel trocar was placed in the umbilical incision. Laparoscopy confirmed correct placement. A 5 mm port was placed in the right lower quadrant under direct laparoscopic visualization after injecting 0.5% Marcaine in  the incision sites. Her pelvis and upper abdomen appeared normal with the exception of absence of her gallbladder and 2 small areas in her posterior cul de sac that are consistent with endometriosis. I removed each of those areas completely using biopsy forceps. Hemostasis was noted.  At the end of the case the CO2 was allwoed to escape from the abdomen. Hemostasis was noted.  Dermabond was used to close the incisions. She was extubated and taken to the recovery room in stable condition.

## 2018-06-01 NOTE — Transfer of Care (Signed)
Immediate Anesthesia Transfer of Care Note  Patient: Kathryn Suarez  Procedure(s) Performed: LAPAROSCOPY DIAGNOSTIC, PERITONEAL BIOPSY (N/A Abdomen)  Patient Location: PACU  Anesthesia Type:General  Level of Consciousness: sedated  Airway & Oxygen Therapy: Patient Spontanous Breathing and Patient connected to face mask oxygen  Post-op Assessment: Report given to RN and Post -op Vital signs reviewed and stable  Post vital signs: Reviewed and stable  Last Vitals:  Vitals Value Taken Time  BP    Temp    Pulse    Resp    SpO2      Last Pain:  Vitals:   06/01/18 1204  TempSrc: Oral  PainSc: 5       Patients Stated Pain Goal: 5 (38/18/40 3754)  Complications: No apparent anesthesia complications

## 2018-06-01 NOTE — H&P (Signed)
Kathryn Suarez is a 32 y.o. female.  Engaged P0 (adopted 53 yo son) here today with the issue of chronic pelvic pain and irregular bleeding since age 63. She had a laparoscopy by Dr. Coolidge Breeze in [place] about 8 years ago and was told that she has PCOS and endometriosis. She has tried OCPs, Mirena (on her second one), depo provera.  Her current Mirena was placed 9/14. She had a normal gyn u/s 12/18.  She generally takes percocet for this pain.      Menstrual History: Menarche age: 69 No LMP recorded. (Menstrual status: IUD).    Past Medical History:  Diagnosis Date  . Anemia   . Asthma   . Chronic pelvic pain in female   . Hypertension    not currently being treated at this time- weight loss   . Kidney stones   . Rash 12/11/2017   legs, arms, trunk  . Sickle cell trait [facility])     Past Surgical History:  Procedure Laterality Date  . DILATION AND CURETTAGE OF UTERUS  03/23/2007  . EXCISION OF TONGUE LESION    . LAPAROSCOPY ABDOMEN DIAGNOSTIC N/A     Family History  Problem Relation Age of Onset  . Hypertension Mother   . Sickle cell trait Father     Social History:  reports that she has never smoked. She has never used smokeless tobacco. She reports that she does not drink alcohol or use drugs.  Allergies:  Allergies  Allergen Reactions  . Latex Hives    Medications Prior to Admission  Medication Sig Dispense Refill Last Dose  . oxyCODONE-acetaminophen (PERCOCET) 7.5-325 MG tablet Take 1 tablet by mouth every 6 (six) hours.    05/31/2018 at Unknown time  . phentermine 15 MG capsule Take 15 mg by mouth every morning.   Past Week at Unknown time    ROS Unemployed currently Has dyspareunia for years (about 6) Also has "throbbing" pain in her vagina for about 6 years  Blood pressure 121/80, pulse 80, temperature 98.2 F (36.8 C), temperature source Oral, resp. rate 16, height 5\' 3"  (1.6 m), weight 101.2 kg (223 lb), SpO2 100 %. Physical Exam Heart- RRR Lungs-  CTAB Abd- benign (She reports that she still has issues with her umbilical incision from 10 years.  Results for orders placed or performed during the hospital encounter of 06/01/18 (from the past 24 hour(s))  Pregnancy, urine POC     Status: None   Collection Time: 06/01/18 11:49 AM  Result Value Ref Range   Preg Test, Ur NEGATIVE NEGATIVE  Hemoglobin-hemacue, POC     Status: None   Collection Time: 06/01/18 12:10 PM  Result Value Ref Range   Hemoglobin 13.0 12.0 - 15.0 g/dL    No results found.  Assessment/Plan: Chronic pelvic pain- plan for diagnostic laparoscopy  She understands the risks of surgery, including, but not to infection, bleeding, DVTs, damage to bowel, bladder, ureters. She wishes to proceed.     Emily Filbert 06/01/2018, 12:43 PM

## 2018-06-01 NOTE — Anesthesia Procedure Notes (Signed)
Procedure Name: Intubation Date/Time: 06/01/2018 1:20 PM Performed by: Maryella Shivers, CRNA Pre-anesthesia Checklist: Patient identified, Emergency Drugs available, Suction available and Patient being monitored Patient Re-evaluated:Patient Re-evaluated prior to induction Oxygen Delivery Method: Circle system utilized Preoxygenation: Pre-oxygenation with 100% oxygen Induction Type: IV induction Ventilation: Mask ventilation without difficulty Laryngoscope Size: Mac and 3 Grade View: Grade I Tube type: Oral Tube size: 7.0 mm Number of attempts: 1 Airway Equipment and Method: Stylet and Oral airway Placement Confirmation: ETT inserted through vocal cords under direct vision,  positive ETCO2 and breath sounds checked- equal and bilateral Secured at: 20 cm Tube secured with: Tape Dental Injury: Teeth and Oropharynx as per pre-operative assessment

## 2018-06-01 NOTE — Discharge Instructions (Signed)
Post Anesthesia Home Care Instructions  Activity: Get plenty of rest for the remainder of the day. A responsible individual must stay with you for 24 hours following the procedure.  For the next 24 hours, DO NOT: -Drive a car -Paediatric nurse -Drink alcoholic beverages -Take any medication unless instructed by your physician -Make any legal decisions or sign important papers.  Meals: Start with liquid foods such as gelatin or soup. Progress to regular foods as tolerated. Avoid greasy, spicy, heavy foods. If nausea and/or vomiting occur, drink only clear liquids until the nausea and/or vomiting subsides. Call your physician if vomiting continues.  Special Instructions/Symptoms: Your throat may feel dry or sore from the anesthesia or the breathing tube placed in your throat during surgery. If this causes discomfort, gargle with warm salt water. The discomfort should disappear within 24 hours.  If you had a scopolamine patch placed behind your ear for the management of post- operative nausea and/or vomiting:  1. The medication in the patch is effective for 72 hours, after which it should be removed.  Wrap patch in a tissue and discard in the trash. Wash hands thoroughly with soap and water. 2. You may remove the patch earlier than 72 hours if you experience unpleasant side effects which may include dry mouth, dizziness or visual disturbances. 3. Avoid touching the patch. Wash your hands with soap and water after contact with the patch.      Diagnostic Laparoscopy, Care After Refer to this sheet in the next few weeks. These instructions provide you with information about caring for yourself after your procedure. Your health care provider may also give you more specific instructions. Your treatment has been planned according to current medical practices, but problems sometimes occur. Call your health care provider if you have any problems or questions after your procedure. What can I  expect after the procedure? After your procedure, it is common to have mild discomfort in the throat and abdomen. Follow these instructions at home:  Take over-the-counter and prescription medicines only as told by your health care provider.  Do not drive for 24 hours if you received a sedative.  Return to your normal activities as told by your health care provider.  Do not take baths, swim, or use a hot tub until your health care provider approves. You may shower.  Follow instructions from your health care provider about how to take care of your incision. Make sure you: ? Wash your hands with soap and water before you change your bandage (dressing). If soap and water are not available, use hand sanitizer. ? Change your dressing as told by your health care provider. ? Leave stitches (sutures), skin glue, or adhesive strips in place. These skin closures may need to stay in place for 2 weeks or longer. If adhesive strip edges start to loosen and curl up, you may trim the loose edges. Do not remove adhesive strips completely unless your health care provider tells you to do that.  Check your incision area every day for signs of infection. Check for: ? More redness, swelling, or pain. ? More fluid or blood. ? Warmth. ? Pus or a bad smell.  It is your responsibility to get the results of your procedure. Ask your health care provider or the department performing the procedure when your results will be ready. Contact a health care provider if:  There is new pain in your shoulders.  You feel light-headed or faint.  You are unable to pass gas or  unable to have a bowel movement.  You feel nauseous or you vomit.  You develop a rash.  You have more redness, swelling, or pain around your incision.  You have more fluid or blood coming from your incision.  Your incision feels warm to the touch.  You have pus or a bad smell coming from your incision.  You have a fever or chills. Get help  right away if:  Your pain is getting worse.  You have ongoing vomiting.  The edges of your incision open up.  You have trouble breathing.  You have chest pain. This information is not intended to replace advice given to you by your health care provider. Make sure you discuss any questions you have with your health care provider. Document Released: 11/11/2015 Document Revised: 05/07/2016 Document Reviewed: 08/13/2015 Elsevier Interactive Patient Education  2018 [employer].

## 2018-06-02 ENCOUNTER — Encounter (HOSPITAL_BASED_OUTPATIENT_CLINIC_OR_DEPARTMENT_OTHER): Payer: Self-pay | Admitting: Obstetrics & Gynecology

## 2018-06-03 ENCOUNTER — Encounter: Payer: Self-pay | Admitting: Obstetrics & Gynecology

## 2018-06-03 DIAGNOSIS — N809 Endometriosis, unspecified: Secondary | ICD-10-CM | POA: Insufficient documentation

## 2018-06-06 ENCOUNTER — Telehealth: Payer: Self-pay | Admitting: Obstetrics & Gynecology

## 2018-06-06 ENCOUNTER — Other Ambulatory Visit: Payer: Self-pay

## 2018-06-06 ENCOUNTER — Inpatient Hospital Stay (HOSPITAL_COMMUNITY)
Admission: AD | Admit: 2018-06-06 | Discharge: 2018-06-06 | Disposition: A | Discharge status: Home / Self Care | Payer: Medicaid Other | Source: Ambulatory Visit | Attending: Obstetrics and Gynecology | Admitting: Obstetrics and Gynecology

## 2018-06-06 ENCOUNTER — Encounter (HOSPITAL_COMMUNITY): Payer: Self-pay | Admitting: *Deleted

## 2018-06-06 DIAGNOSIS — D573 Sickle-cell trait: Secondary | ICD-10-CM | POA: Diagnosis not present

## 2018-06-06 DIAGNOSIS — J45909 Unspecified asthma, uncomplicated: Secondary | ICD-10-CM | POA: Insufficient documentation

## 2018-06-06 DIAGNOSIS — R102 Pelvic and perineal pain: Secondary | ICD-10-CM | POA: Diagnosis not present

## 2018-06-06 DIAGNOSIS — K5900 Constipation, unspecified: Secondary | ICD-10-CM | POA: Diagnosis not present

## 2018-06-06 DIAGNOSIS — R109 Unspecified abdominal pain: Secondary | ICD-10-CM | POA: Diagnosis not present

## 2018-06-06 DIAGNOSIS — G8929 Other chronic pain: Secondary | ICD-10-CM | POA: Insufficient documentation

## 2018-06-06 DIAGNOSIS — N809 Endometriosis, unspecified: Secondary | ICD-10-CM | POA: Insufficient documentation

## 2018-06-06 DIAGNOSIS — Z87442 Personal history of urinary calculi: Secondary | ICD-10-CM | POA: Insufficient documentation

## 2018-06-06 DIAGNOSIS — E282 Polycystic ovarian syndrome: Secondary | ICD-10-CM | POA: Diagnosis not present

## 2018-06-06 DIAGNOSIS — R1031 Right lower quadrant pain: Secondary | ICD-10-CM

## 2018-06-06 DIAGNOSIS — I10 Essential (primary) hypertension: Secondary | ICD-10-CM | POA: Insufficient documentation

## 2018-06-06 DIAGNOSIS — Z9889 Other specified postprocedural states: Secondary | ICD-10-CM

## 2018-06-06 HISTORY — DX: Endometriosis, unspecified: N80.9

## 2018-06-06 LAB — CBC
HEMATOCRIT: 38.8 % (ref 36.0–46.0)
Hemoglobin: 13 g/dL (ref 12.0–15.0)
MCH: 27.3 pg (ref 26.0–34.0)
MCHC: 33.5 g/dL (ref 30.0–36.0)
MCV: 81.5 fL (ref 78.0–100.0)
Platelets: 247 10*3/uL (ref 150–400)
RBC: 4.76 MIL/uL (ref 3.87–5.11)
RDW: 13.1 % (ref 11.5–15.5)
WBC: 9.9 10*3/uL (ref 4.0–10.5)

## 2018-06-06 LAB — URINALYSIS, ROUTINE W REFLEX MICROSCOPIC
Bilirubin Urine: NEGATIVE
GLUCOSE, UA: NEGATIVE mg/dL
Ketones, ur: NEGATIVE mg/dL
Leukocytes, UA: NEGATIVE
Nitrite: NEGATIVE
Protein, ur: NEGATIVE mg/dL
SPECIFIC GRAVITY, URINE: 1.009 (ref 1.005–1.030)
pH: 6 (ref 5.0–8.0)

## 2018-06-06 MED ORDER — OXYCODONE-ACETAMINOPHEN 5-325 MG PO TABS: 2.0000 | ORAL_TABLET | Freq: Once | ORAL | Status: DC

## 2018-06-06 MED ORDER — KETOROLAC TROMETHAMINE 60 MG/2ML IM SOLN
60.0000 mg | Freq: Once | INTRAMUSCULAR | Status: AC
  Administered 2018-06-06: 60 mg via INTRAMUSCULAR
  Filled 2018-06-06: qty 2

## 2018-06-06 MED ORDER — KETOROLAC TROMETHAMINE 10 MG PO TABS: 10.0000 mg | ORAL_TABLET | Freq: Four times a day (QID) | ORAL | 0 refills | Status: DC | PRN

## 2018-06-06 NOTE — Discharge Instructions (Signed)
Ketorolac tablets What is this medicine? KETOROLAC (kee toe ROLE ak) is a non-steroidal anti-inflammatory drug (NSAID). It is used for a short while to treat moderate to severe pain, including pain after surgery. It should not be used for more than 5 days. This medicine may be used for other purposes; ask your health care provider or pharmacist if you have questions. COMMON BRAND NAME(S): Toradol What should I tell my health care provider before I take this medicine? They need to know if you have any of these conditions: -asthma -bleeding problems like hemophilia -cigarette smoker -drink more than 3 alcohol containing drinks a day -heart disease or circulation problems such as heart failure or leg edema (fluid retention) -high blood pressure -kidney disease -liver disease -stomach bleeding or ulcers -an unusual or allergic reaction to ketorolac, aspirin, other NSAIDs, other medicines, foods, dyes, or preservatives -pregnant or trying to get pregnant -breast-feeding How should I use this medicine? Take this medicine by mouth with a full glass of water. Follow the directions on the prescription label. Take your medicine at regular intervals. Do not take your medicine more often than directed. Do not take more than the recommended dose. A special MedGuide will be given to you by the pharmacist with each prescription and refill. Be sure to read this information carefully each time. Talk to your pediatrician regarding the use of this medicine in children. While this drug may be prescribed for children as young as 110 years of age for selected conditions, precautions do apply. Patients over 50 years old may have a stronger reaction and need a smaller dose. Overdosage: If you think you have taken too much of this medicine contact a poison control center or emergency room at once. NOTE: This medicine is only for you. Do not share this medicine with others. What if I miss a dose? If you miss a dose,  take it as soon as you can. If it is almost time for your next dose, take only that dose. Do not take double or extra doses. What may interact with this medicine? Do not take this medicine with any of the following medications: -aspirin and aspirin-like medicines -cidofovir -methotrexate -NSAIDs, medicines for pain and inflammation, like ibuprofen or naproxen -pemetrexed -probenecid This medicine may also interact with the following medications: -alcohol -alendronate -alprazolam -carbamazepine -cyclosporine -diuretics -flavocoxid -fluoxetine -ginkgo -lithium -medicines for high blood pressure like enalapril -medicines that affect platelets like pentoxifylline -medicines that treat or prevent blood clots like heparin, warfarin -muscle relaxants -phenytoin -steroid medicines like prednisone or cortisone -thiothixene This list may not describe all possible interactions. Give your health care provider a list of all the medicines, herbs, non-prescription drugs, or dietary supplements you use. Also tell them if you smoke, drink alcohol, or use illegal drugs. Some items may interact with your medicine. What should I watch for while using this medicine? Tell your doctor or health care professional if your pain does not get better. Talk to your doctor before taking another medicine for pain. Do not treat yourself. This medicine does not prevent heart attack or stroke. In fact, this medicine may increase the chance of a heart attack or stroke. The chance may increase with longer use of this medicine and in people who have heart disease. If you take aspirin to prevent heart attack or stroke, talk with your doctor or health care professional. Do not take medicines such as ibuprofen and naproxen with this medicine. Side effects such as stomach upset, nausea, or ulcers may  be more likely to occur. Many medicines available without a prescription should not be taken with this medicine. This medicine  can cause ulcers and bleeding in the stomach and intestines at any time during treatment. Do not smoke cigarettes or drink alcohol. These increase irritation to your stomach and can make it more susceptible to damage from this medicine. Ulcers and bleeding can happen without warning symptoms and can cause death. You may get drowsy or dizzy. Do not drive, use machinery, or do anything that needs mental alertness until you know how this medicine affects you. Do not stand or sit up quickly, especially if you are an older patient. This reduces the risk of dizzy or fainting spells. This medicine can cause you to bleed more easily. Try to avoid damage to your teeth and gums when you brush or floss your teeth. What side effects may I notice from receiving this medicine? Side effects that you should report to your doctor or health care professional as soon as possible: -allergic reactions like skin rash, itching or hives, swelling of the face, lips, or tongue -breathing problems -high blood pressure -nausea, vomiting -redness, blistering, peeling or loosening of the skin, including inside the mouth -severe stomach pain -signs and symptoms of bleeding such as bloody or black, tarry stools; red or dark-brown urine; spitting up blood or brown material that looks like coffee grounds; red spots on the skin; unusual bruising or bleeding from the eye, gums, or nose -signs and symptoms of a stroke like changes in vision; confusion; trouble speaking or understanding; severe headaches; sudden numbness or weakness of the face, arm or leg; trouble walking; dizziness; loss of balance or coordination -trouble passing urine or change in the amount of urine -unexplained weight gain or swelling -unusually weak or tired -yellowing of eyes or skin Side effects that usually do not require medical attention (report to your doctor or health care professional if they continue or are  bothersome): -diarrhea -dizziness -headache -heartburn This list may not describe all possible side effects. Call your doctor for medical advice about side effects. You may report side effects to FDA at 1-800-FDA-1088. Where should I keep my medicine? Keep out of the reach of children. Store at room temperature between 20 and 25 degrees C (68 and 77 degrees F). Throw away any unused medicine after the expiration date. NOTE: This sheet is a summary. It may not cover all possible information. If you have questions about this medicine, talk to your doctor, pharmacist, or health care provider.  2018 Elsevier/Gold Standard (2013-04-18 16:36:05)

## 2018-06-06 NOTE — MAU Provider Note (Signed)
History     CSN: [id number]  Arrival date and time: 06/06/18 1107   First Provider Initiated Contact with Patient 06/06/18 1152      Chief Complaint  Patient presents with  . Abdominal Pain   HPI  Kathryn Suarez is a 32 y.o. G0P0 non pregnant patient who presents to MAU with chief complaint of abdominal pain. She is s/p diagnostic laparoscopy with Dr. Hulan Fray 06/01/2018 related to existing diagnoses of chronic pelvic pain in the setting of PCOS and Endometriosis.  Abdominal Pain This is a new problem. Patient reports she was cared for in her pain clinic the day after her procedure. During that appointment she was told not to take the pain medication prescribed by Dr. Hulan Fray as it would interfere with her existing pain contract. She has been taking Percocet 7.5 no more than three times per day combined with Ibuprofen "so that I have enough pills to last until my next appointment at the pain clinic" which is scheduled for June 17, 2018.  Patient reports that in addition to visiting the pain clinic the day after her laparoscopy, she has also been walking around her neighborhood and walking "a few blocks but less than half a mile" to check her mail.  Left flank pain This is a new problem. Patient states her pain is severe enough to affect her ability to walk, so her mother loaned her a store-bought walking cane, "and I don't really know how to use it". States she usually carries the cane in her right hand and pain is focused on her left side across her back from her armpit to her tailbone.  Constipation Patient reports small bowel movement "a few days ago". Also states she is nervous to eat with frequency of pain.  Pain with voiding Patient states she drinks a few sips of water but has been limiting herself due to pain and pressure with voiding.  Pertinent Gynecological History: Irregular bleeding since age 38 Contraception: IUD Mirena, Depo provera DES exposure: unknown Blood  transfusions: none Sexually transmitted diseases: no past history Previous GYN Procedures: laparascopy 06/01/2018  Last mammogram: N/A age 20  Last pap: normal Date: 02/20/2015   Past Medical History:  Diagnosis Date  . Anemia   . Asthma   . Chronic pelvic pain in female   . Endometriosis   . Hypertension    not currently being treated at this time- weight loss   . Kidney stones   . Rash 12/11/2017   legs, arms, trunk  . Sickle cell trait [facility])     Past Surgical History:  Procedure Laterality Date  . DILATION AND CURETTAGE OF UTERUS  03/23/2007  . EXCISION OF TONGUE LESION    . LAPAROSCOPY N/A 06/01/2018   Procedure: LAPAROSCOPY DIAGNOSTIC, PERITONEAL BIOPSY;  Surgeon: Emily Filbert, MD;  Location: [facility];  Service: Gynecology;  Laterality: N/A;  . LAPAROSCOPY ABDOMEN DIAGNOSTIC N/A     Family History  Problem Relation Age of Onset  . Hypertension Mother   . Sickle cell trait Father     Social History   Tobacco Use  . Smoking status: Never Smoker  . Smokeless tobacco: Never Used  Substance Use Topics  . Alcohol use: No    Comment: occas  . Drug use: No    Allergies:  Allergies  Allergen Reactions  . Latex Hives    Medications Prior to Admission  Medication Sig Dispense Refill Last Dose  . ibuprofen (ADVIL,MOTRIN) 200 MG tablet Take 800  mg by mouth every 8 (eight) hours as needed for moderate pain.   06/05/2018 at Unknown time  . oxyCODONE-acetaminophen (PERCOCET/ROXICET) 5-325 MG tablet Take 1 tablet by mouth every 4 (four) hours as needed. 30 tablet 0 06/06/2018 at Unknown time  . phentermine 15 MG capsule Take 15 mg by mouth every morning.   06/05/2018 at Unknown time  . ibuprofen (ADVIL,MOTRIN) 600 MG tablet Take 1 tablet (600 mg total) by mouth every 6 (six) hours as needed. (Patient not taking: Reported on 06/06/2018) 30 tablet 1 Not Taking at Unknown time    Review of Systems  Constitutional: Positive for fatigue. Negative for chills  and fever.  Respiratory: Negative for shortness of breath and wheezing.   Gastrointestinal: Positive for abdominal pain and constipation. Negative for diarrhea, nausea and vomiting.  Genitourinary: Positive for decreased urine volume, difficulty urinating and flank pain. Negative for urgency, vaginal bleeding, vaginal discharge and vaginal pain.       Left flank pain running from left armpit to tailbone  Neurological: Negative for dizziness, seizures and weakness.   Physical Exam   Blood pressure 126/78, pulse 91, temperature 98.9 F (37.2 C), temperature source Oral, resp. rate 18, weight 221 lb 12 oz (100.6 kg), SpO2 99 %.  Physical Exam  Nursing note and vitals reviewed. Constitutional: She is oriented to person, place, and time. She appears well-developed and well-nourished.  Cardiovascular: Normal rate and regular rhythm.  Respiratory: Effort normal and breath sounds normal. No respiratory distress. She exhibits no tenderness.  GI: Soft. Bowel sounds are normal. She exhibits no distension and no mass. There is tenderness. There is no rebound, no guarding and no CVA tenderness.  Lap sites dry, closed, well approximated  Musculoskeletal: Normal range of motion.       Lumbar back: She exhibits tenderness.       Back:  Neurological: She is alert and oriented to person, place, and time. She has normal reflexes.  Skin: Skin is warm and dry.  Psychiatric: She has a normal mood and affect. Her behavior is normal. Judgment and thought content normal.    MAU Course  Procedures  MDM Orders Placed This Encounter  Procedures  . Urinalysis, Routine w reflex microscopic    Standing Status:   Standing    Number of Occurrences:   1  . CBC    Standing Status:   Standing    Number of Occurrences:   1  . Discharge patient Discharge disposition: 01-Home or Self Care; Discharge patient date: 06/06/2018    Standing Status:   Standing    Number of Occurrences:   1    Order Specific Question:    Discharge disposition    Answer:   01-Home or Self Care [1]    Order Specific Question:   Discharge patient date    Answer:   06/06/2018   Results for orders placed or performed during the hospital encounter of 06/06/18 (from the past 24 hour(s))  CBC     Status: None   Collection Time: 06/06/18 12:25 PM  Result Value Ref Range   WBC 9.9 4.0 - 10.5 K/uL   RBC 4.76 3.87 - 5.11 MIL/uL   Hemoglobin 13.0 12.0 - 15.0 g/dL   HCT 38.8 36.0 - 46.0 %   MCV 81.5 78.0 - 100.0 fL   MCH 27.3 26.0 - 34.0 pg   MCHC 33.5 30.0 - 36.0 g/dL   RDW 13.1 11.5 - 15.5 %   Platelets 247 150 - 400 K/uL  Urinalysis, Routine w reflex microscopic     Status: Abnormal   Collection Time: 06/06/18  1:18 PM  Result Value Ref Range   Color, Urine YELLOW YELLOW   APPearance HAZY (A) CLEAR   Specific Gravity, Urine 1.009 1.005 - 1.030   pH 6.0 5.0 - 8.0   Glucose, UA NEGATIVE NEGATIVE mg/dL   Hgb urine dipstick MODERATE (A) NEGATIVE   Bilirubin Urine NEGATIVE NEGATIVE   Ketones, ur NEGATIVE NEGATIVE mg/dL   Protein, ur NEGATIVE NEGATIVE mg/dL   Nitrite NEGATIVE NEGATIVE   Leukocytes, UA NEGATIVE NEGATIVE   RBC / HPF 0-5 0 - 5 RBC/hpf   WBC, UA 0-5 0 - 5 WBC/hpf   Bacteria, UA RARE (A) NONE SEEN   Squamous Epithelial / LPF 6-10 0 - 5     Ongoing contract with pain clinic Hemodynamically stable Flank pain related to walking with cane Lap sites healing well Pain managed by Toradol IM x 1 in MAU  Assessment and Plan  --Abdominal pain related to laparoscopy --Reviewed normal activity level s/p laparoscopy --Given prescription for Toradol 10 mg PO x 6 hours  --Condition stable at time of discharge --Follow up with pain clinic as scheduled for 06/17/18 or with clinic PRN for worsening pain  Darlina Rumpf, CNM 06/06/2018, 1:56 PM

## 2018-06-06 NOTE — MAU Note (Signed)
Had surgery last Wed for endometriosis.  Is having so much pain, hard to urinate- has a lot of pressure. Is very sore. Has had one very small BM since prior to surgery. ? Smell when she pees

## 2018-06-10 NOTE — Telephone Encounter (Signed)
Call pt [phone]. Pt states Dr Hulan Fray was going to call her after the surgery to let her what happened during surgery and what steps to take next. Please call pt.

## 2018-06-13 NOTE — Telephone Encounter (Signed)
Gave her the results of her biopsy, endometriosis. She still has some her pre op pain, doing well post op She has a post op visit scheduled.

## 2018-06-20 ENCOUNTER — Ambulatory Visit: Payer: Medicaid Other | Admitting: Obstetrics & Gynecology

## 2018-06-20 ENCOUNTER — Encounter: Payer: Self-pay | Admitting: Obstetrics & Gynecology

## 2018-06-20 VITALS — BP 135/86 | HR 80 | Wt 218.2 lb

## 2018-06-20 DIAGNOSIS — Z9889 Other specified postprocedural states: Secondary | ICD-10-CM

## 2018-06-20 MED ORDER — LEVONORGEST-ETH ESTRAD 91-DAY 0.15-0.03 &0.01 MG PO TABS: ORAL_TABLET | ORAL | 4 refills | Status: DC

## 2018-06-20 NOTE — Progress Notes (Signed)
   Subjective:    Patient ID: Kathryn Suarez, female    DOB: Jan 02, 1986, 32 y.o.   MRN: [id number]  HPI 32 yo G0 (adopted son) here for a post op visit. She is doing well. She has an IUD in place to manage her DUB.  Review of Systems She is in a same sex relationship.    Objective:   Physical Exam Breathing, conversing, and ambulating normally Well nourished, well hydrated Black female, no apparent distress Incisions healed well    Assessment & Plan:  Post op- doing well Biopsy-proven endometriosis- start camrese in a continuous fashion Come back 4 months for follow up

## 2018-07-01 ENCOUNTER — Ambulatory Visit: Payer: Medicaid Other | Admitting: Obstetrics & Gynecology

## 2018-07-27 ENCOUNTER — Telehealth: Payer: Self-pay | Admitting: Obstetrics & Gynecology

## 2018-07-27 NOTE — Telephone Encounter (Signed)
Patient had surgery and said she has been bleeding for the past three weeks and she's not sure why

## 2018-07-27 NOTE — Telephone Encounter (Signed)
Pt stated that since her procedure she was placed on the birth control pills and for about 20 days she's had bleeding and she is changing pad every two hours. Advised pt that with this procedure bleeding for 4-6 weeks is normal, Advised pt that if bleeding and cramping continues to come to our walk in clinic, Pt verbalized understanding.

## 2018-08-30 ENCOUNTER — Telehealth: Payer: Self-pay | Admitting: Student

## 2018-08-30 NOTE — Telephone Encounter (Signed)
Patient called wanting to speak with a nurse about her constantly bleeding has been bleeding since surgery. Has questions about new birth control pill she was put on. Requests to be called back.

## 2018-09-01 NOTE — Telephone Encounter (Signed)
I called Kathryn Suarez and she reports since she had Diagnostic Lap/ BX in June she has been bleeding almost every day.States before surgery she at least went a month without bleeding sometimes. States mostly bleeds like a period, but every 2-3 weeks may get a break for a few days. Also states she rarely has intercourse but when she does; she bleeds more. States wanted to let Dr.Dove know this and see what else she can do for the bleeding.  I informed her Dr. Hulan Fray is not here today but I will send a message to [facility] and when she replies we will call her. Also noted she is supposed to have a 4 month follow up appt. ( due Nov).

## 2018-09-07 ENCOUNTER — Ambulatory Visit (INDEPENDENT_AMBULATORY_CARE_PROVIDER_SITE_OTHER): Payer: Medicaid Other | Admitting: Obstetrics & Gynecology

## 2018-09-07 ENCOUNTER — Encounter: Payer: Self-pay | Admitting: Obstetrics & Gynecology

## 2018-09-07 ENCOUNTER — Other Ambulatory Visit (HOSPITAL_COMMUNITY)
Admission: RE | Admit: 2018-09-07 | Discharge: 2018-09-07 | Disposition: A | Discharge status: Home / Self Care | Payer: Medicaid Other | Source: Ambulatory Visit | Attending: Obstetrics & Gynecology | Admitting: Obstetrics & Gynecology

## 2018-09-07 VITALS — BP 120/80 | HR 92 | Wt 205.0 lb

## 2018-09-07 DIAGNOSIS — Z0000 Encounter for general adult medical examination without abnormal findings: Secondary | ICD-10-CM

## 2018-09-07 DIAGNOSIS — N938 Other specified abnormal uterine and vaginal bleeding: Secondary | ICD-10-CM | POA: Diagnosis not present

## 2018-09-07 LAB — CBC
Hematocrit: 38 % (ref 34.0–46.6)
Hemoglobin: 12.5 g/dL (ref 11.1–15.9)
MCH: 27.7 pg (ref 26.6–33.0)
MCHC: 32.9 g/dL (ref 31.5–35.7)
MCV: 84 fL (ref 79–97)
PLATELETS: 245 10*3/uL (ref 150–450)
RBC: 4.52 x10E6/uL (ref 3.77–5.28)
RDW: 13 % (ref 12.3–15.4)
WBC: 8.8 10*3/uL (ref 3.4–10.8)

## 2018-09-07 NOTE — Progress Notes (Signed)
   Subjective:    Patient ID: Kathryn Suarez, female    DOB: 02-15-1986, 32 y.o.   MRN: [id number]  HPI 32 yo single G78 (adopted 63 yo son) here with continued irregular bleeding. She has a very long h/o heavy periods. She has had a total of 3 IUDs for this issue. The IUD does decrease the heaviness of the bleeding but she bleeds almost every day.   Of note, she and partner have another female partner who also sleeps with men.  She also has CPP. I did a laparoscopy 6/19 and removed endometriosis. I then added Camrese OCPs to her treatment plan. The pain is better with the OCPs.   She also has a complaint of weekly swelling of her vulva with "throbbing" pain. Taking a bath makes it better.  She is treated with long term percocet by the pain clinic for her CPP.   Review of Systems Pap due FH- no breast/gyn/colon cancer    Objective:   Physical Exam  Breathing, conversing, and ambulating normally Well nourished, well hydrated Black female, no apparent distress Abd- benign Speculum exam- Liletta strings seen, no lesions      Assessment & Plan:  DUB- check CBC I feel certain that this is from the IUD She wants her GF here with her when she has it removed. Preventative- check pap smear

## 2018-09-08 LAB — CYTOLOGY - PAP
Chlamydia: NEGATIVE
DIAGNOSIS: NEGATIVE
HPV: NOT DETECTED
NEISSERIA GONORRHEA: NEGATIVE

## 2018-09-30 ENCOUNTER — Ambulatory Visit: Payer: Medicaid Other | Admitting: Obstetrics & Gynecology

## 2018-12-14 DIAGNOSIS — N83519 Torsion of ovary and ovarian pedicle, unspecified side: Secondary | ICD-10-CM

## 2018-12-14 HISTORY — DX: Torsion of ovary and ovarian pedicle, unspecified side: N83.519

## 2018-12-23 ENCOUNTER — Encounter: Payer: Self-pay | Admitting: Obstetrics & Gynecology

## 2018-12-23 ENCOUNTER — Ambulatory Visit (INDEPENDENT_AMBULATORY_CARE_PROVIDER_SITE_OTHER): Payer: Medicaid Other | Admitting: Obstetrics & Gynecology

## 2018-12-23 VITALS — BP 134/81 | HR 73 | Ht 63.0 in | Wt 198.0 lb

## 2018-12-23 DIAGNOSIS — R102 Pelvic and perineal pain: Secondary | ICD-10-CM

## 2018-12-23 DIAGNOSIS — R42 Dizziness and giddiness: Secondary | ICD-10-CM

## 2018-12-23 DIAGNOSIS — N938 Other specified abnormal uterine and vaginal bleeding: Secondary | ICD-10-CM | POA: Diagnosis not present

## 2018-12-23 LAB — CBC
HEMOGLOBIN: 11.9 g/dL (ref 11.1–15.9)
Hematocrit: 36.9 % (ref 34.0–46.6)
MCH: 27 pg (ref 26.6–33.0)
MCHC: 32.2 g/dL (ref 31.5–35.7)
MCV: 84 fL (ref 79–97)
Platelets: 255 10*3/uL (ref 150–450)
RBC: 4.41 x10E6/uL (ref 3.77–5.28)
RDW: 12.9 % (ref 11.7–15.4)
WBC: 6.8 10*3/uL (ref 3.4–10.8)

## 2018-12-23 MED ORDER — NORGESTREL-ETHINYL ESTRADIOL 0.3-30 MG-MCG PO TABS: 1.0000 | ORAL_TABLET | Freq: Every day | ORAL | 11 refills | Status: DC

## 2018-12-23 NOTE — Progress Notes (Signed)
   Subjective:    Patient ID: Kathryn Suarez, female    DOB: 1986-10-20, 33 y.o.   MRN: [id number]  HPI 33 yo single G0 here today telling me that yesterday she had an occasion of heavy bleeding that lasted about 2 hours. She has a Amb[employer] person IUD. She has had an IUD on and off for about 10 years. This one was placed about 1-2 years ago. She uses this for management of DUB (She has a girlfriend).   She had a laparoscopy about a 6 months ago and endometriosis was found.  She does not want the IUD removed.  She has tried OCPs and depo provera for the pain and bleeding. She gained weight with the depo and was forgetting to take the OCPs. Review of Systems Pap smear normal 9/19    Objective:   Physical Exam Breathing, conversing, and ambulating normally Well nourished, well hydrated Black female, no apparent distress Abd- benign       Assessment & Plan:  DUB and endometriosis- she thinks that she will be able to remember to take OCPs now that she is older. She would like to keep the IUD in place at present. Check CBC Come back 3 months

## 2019-03-17 ENCOUNTER — Ambulatory Visit (HOSPITAL_COMMUNITY)
Admission: EM | Admit: 2019-03-17 | Discharge: 2019-03-17 | Disposition: A | Discharge status: Home / Self Care | Payer: Medicaid Other | Attending: Family Medicine | Admitting: Family Medicine

## 2019-03-17 ENCOUNTER — Encounter (HOSPITAL_COMMUNITY): Payer: Self-pay | Admitting: Emergency Medicine

## 2019-03-17 ENCOUNTER — Other Ambulatory Visit: Payer: Self-pay

## 2019-03-17 DIAGNOSIS — M779 Enthesopathy, unspecified: Secondary | ICD-10-CM | POA: Diagnosis not present

## 2019-03-17 MED ORDER — PREDNISONE 10 MG (21) PO TBPK: ORAL_TABLET | ORAL | 0 refills | Status: DC

## 2019-03-17 NOTE — ED Provider Notes (Signed)
[place]    CSN: [id number] Arrival date & time: 03/17/19  1419     History   Chief Complaint Chief Complaint  Patient presents with  . Wrist Pain    HPI Kathryn Suarez is a 33 y.o. female.   Patient is a 33 year old female with past medical history of anemia, asthma, hypertension, sickle cell.  She presents today with right wrist pain that is radiating up the right forearm.  This is been present worsening over the past couple days.  She has had some mild swelling.  Very tender to touch and with range of motion.  She is a [employer].  No injuries to the wrist, hand or arm.  No associated fever, chills.  She has been taking her chronic pain medication along with ibuprofen as needed.  She is also been using ice and Ace bandage.   ROS per HPI      Past Medical History:  Diagnosis Date  . Anemia   . Asthma   . Chronic pelvic pain in female   . Endometriosis   . Hypertension    not currently being treated at this time- weight loss   . Kidney stones   . Rash 12/11/2017   legs, arms, trunk  . Sickle cell trait [facility])     Patient Active Problem List   Diagnosis Date Noted  . Endometriosis determined by laparoscopy 06/03/2018  . Morbid obesity ([place]) 11/30/2017  . 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 Right ovarian complex cyst with small mural nodule 09/12/2013  . Menometrorrhagia 08/17/2013  . Sickle cell anemia ([place]) 05/22/2013  . 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 Breast discharge 10/16/2011  . Chronic pelvic pain in female 10/02/2011    Past Surgical History:  Procedure Laterality Date  . DILATION AND CURETTAGE OF UTERUS  03/23/2007  . EXCISION OF TONGUE LESION    . LAPAROSCOPY N/A 06/01/2018   Procedure: LAPAROSCOPY DIAGNOSTIC, PERITONEAL BIOPSY;  Surgeon: Emily Filbert, MD;  Location: [place];  Service: Gynecology;  Laterality: N/A;  . LAPAROSCOPY ABDOMEN DIAGNOSTIC N/A     OB History    Gravida  0   Para      Term      Preterm      AB      Living        SAB      TAB      Ectopic      Multiple      Live Births               Home Medications    Prior to Admission medications   Medication Sig Start Date End Date Taking? Authorizing Provider  oxyCODONE-acetaminophen (PERCOCET/ROXICET) 5-325 MG tablet Take 3 tablets by mouth every 6 (six) hours as needed for severe pain (gets this at the pain clinic).   Yes [provider]  norgestrel-ethinyl estradiol (LO/OVRAL,CRYSELLE) 0.3-30 MG-MCG tablet Take 1 tablet by mouth daily. 12/23/18   Emily Filbert, MD  predniSONE (STERAPRED UNI-PAK 21 TAB) 10 MG (21) TBPK tablet 6 tabs for 1 day, then 5 tabs for 1 das, then 4 tabs for 1 day, then 3 tabs for 1 day, 2 tabs for 1 day, then 1 tab for 1 day 03/17/19   Orvan July, NP    Family History Family History  Problem Relation Age of Onset  . Hypertension Mother   . Sickle cell trait Father     Social History Social History  Tobacco Use  . Smoking status: Never Smoker  . Smokeless tobacco: Never Used  Substance Use Topics  . Alcohol use: No    Comment: occas  . Drug use: No     Allergies   Latex   Review of Systems Review of Systems   Physical Exam Triage Vital Signs ED Triage Vitals  Enc Vitals Group     BP 03/17/19 1430 128/84     Pulse Rate 03/17/19 1430 (!) 102     Resp 03/17/19 1430 16     Temp 03/17/19 1430 97.6 F (36.4 C)     Temp Source 03/17/19 1430 Oral     SpO2 03/17/19 1430 99 %     Weight --      Height --      Head Circumference --      Peak Flow --      Pain Score 03/17/19 1431 7     Pain Loc --      Pain Edu? --      Excl. in [place]? --    No data found.  Updated Vital Signs BP 128/84   Pulse (!) 102   Temp 97.6 F (36.4 C) (Oral)   Resp 16   SpO2 99%   Visual Acuity Right Eye Distance:   Left Eye Distance:   Bilateral Distance:    Right Eye Near:   Left Eye Near:    Bilateral Near:     Physical Exam Vitals signs and  nursing note reviewed.  Constitutional:      Appearance: Normal appearance.  HENT:     Head: Normocephalic and atraumatic.     Nose: Nose normal.     Mouth/Throat:     Pharynx: Oropharynx is clear.  Eyes:     Conjunctiva/sclera: Conjunctivae normal.  Neck:     Musculoskeletal: Normal range of motion.  Pulmonary:     Effort: Pulmonary effort is normal.  Musculoskeletal:        General: Swelling and tenderness present.     Comments: Very tender to palpation of the right medial wrist. Mild swelling Positive Wynn Maudlin test She is able to wiggle fingers.  Sensation and radial pulse intact.  Good cap refill  Skin:    General: Skin is warm and dry.  Neurological:     Mental Status: She is alert.  Psychiatric:        Mood and Affect: Mood normal.      UC Treatments / Results  Labs (all labs ordered are listed, but only abnormal results are displayed) Labs Reviewed - No data to display  EKG None  Radiology No results found.  Procedures Procedures (including critical care time)  Medications Ordered in UC Medications - No data to display  Initial Impression / Assessment and Plan / UC Course  I have reviewed the triage vital signs and the nursing notes.  Pertinent labs & imaging results that were available during my care of the patient were reviewed by me and considered in my medical decision making (see chart for details).     Symptoms consistent with tendinitis. We will have her use thumb spica splint and prednisone taper as prescribed Told that she can continue the ibuprofen after the prednisone tapers over if she needs it Rest, ice, elevate and wear splint Follow up as needed for continued or worsening symptoms  Final Clinical Impressions(s) / UC Diagnoses   Final diagnoses:  Tendonitis     Discharge Instructions     We are treating  you for tendinitis Prednisone daily for 6 days. Take with food  Wear the splint Rest, ice elevate.     ED  Prescriptions    Medication Sig Dispense Auth. Provider   predniSONE (STERAPRED UNI-PAK 21 TAB) 10 MG (21) TBPK tablet 6 tabs for 1 day, then 5 tabs for 1 das, then 4 tabs for 1 day, then 3 tabs for 1 day, 2 tabs for 1 day, then 1 tab for 1 day 21 tablet Rozanna Box, Syrai Gladwin A, NP     Controlled Substance Prescriptions [place] Controlled Substance Registry consulted? Not Applicable   Orvan July, NP 03/17/19 1450

## 2019-03-17 NOTE — ED Triage Notes (Signed)
PT has pain in right forearm that extends down to wrist and hand. Has been told she has a history of bursitis.

## 2019-03-17 NOTE — Discharge Instructions (Addendum)
We are treating you for tendinitis Prednisone daily for 6 days. Take with food  Wear the splint Rest, ice elevate.

## 2019-03-21 ENCOUNTER — Emergency Department (HOSPITAL_COMMUNITY)
Admission: EM | Admit: 2019-03-21 | Discharge: 2019-03-21 | Disposition: A | Discharge status: Home / Self Care | Payer: Medicaid Other | Attending: Emergency Medicine | Admitting: Emergency Medicine

## 2019-03-21 ENCOUNTER — Telehealth: Payer: Self-pay | Admitting: Medical

## 2019-03-21 ENCOUNTER — Other Ambulatory Visit: Payer: Self-pay

## 2019-03-21 ENCOUNTER — Emergency Department (HOSPITAL_COMMUNITY): Payer: Medicaid Other

## 2019-03-21 DIAGNOSIS — N83201 Unspecified ovarian cyst, right side: Secondary | ICD-10-CM

## 2019-03-21 DIAGNOSIS — E669 Obesity, unspecified: Secondary | ICD-10-CM | POA: Insufficient documentation

## 2019-03-21 DIAGNOSIS — Z87442 Personal history of urinary calculi: Secondary | ICD-10-CM | POA: Insufficient documentation

## 2019-03-21 DIAGNOSIS — Z793 Long term (current) use of hormonal contraceptives: Secondary | ICD-10-CM | POA: Insufficient documentation

## 2019-03-21 DIAGNOSIS — Z6833 Body mass index (BMI) 33.0-33.9, adult: Secondary | ICD-10-CM | POA: Insufficient documentation

## 2019-03-21 DIAGNOSIS — R1031 Right lower quadrant pain: Secondary | ICD-10-CM | POA: Diagnosis present

## 2019-03-21 DIAGNOSIS — D573 Sickle-cell trait: Secondary | ICD-10-CM | POA: Insufficient documentation

## 2019-03-21 LAB — CBC WITH DIFFERENTIAL/PLATELET
Abs Immature Granulocytes: 0.02 10*3/uL (ref 0.00–0.07)
Basophils Absolute: 0 10*3/uL (ref 0.0–0.1)
Basophils Relative: 0 %
Eosinophils Absolute: 0.2 10*3/uL (ref 0.0–0.5)
Eosinophils Relative: 3 %
HCT: 38.5 % (ref 36.0–46.0)
Hemoglobin: 12.9 g/dL (ref 12.0–15.0)
Immature Granulocytes: 0 %
Lymphocytes Relative: 27 %
Lymphs Abs: 2.1 10*3/uL (ref 0.7–4.0)
MCH: 27.9 pg (ref 26.0–34.0)
MCHC: 33.5 g/dL (ref 30.0–36.0)
MCV: 83.3 fL (ref 80.0–100.0)
Monocytes Absolute: 0.4 10*3/uL (ref 0.1–1.0)
Monocytes Relative: 5 %
Neutro Abs: 4.9 10*3/uL (ref 1.7–7.7)
Neutrophils Relative %: 65 %
Platelets: 240 10*3/uL (ref 150–400)
RBC: 4.62 MIL/uL (ref 3.87–5.11)
RDW: 12.4 % (ref 11.5–15.5)
WBC: 7.6 10*3/uL (ref 4.0–10.5)
nRBC: 0 % (ref 0.0–0.2)

## 2019-03-21 LAB — COMPREHENSIVE METABOLIC PANEL
ALT: 13 U/L (ref 0–44)
AST: 15 U/L (ref 15–41)
Albumin: 3.7 g/dL (ref 3.5–5.0)
Alkaline Phosphatase: 69 U/L (ref 38–126)
Anion gap: 10 (ref 5–15)
BUN: 9 mg/dL (ref 6–20)
CO2: 25 mmol/L (ref 22–32)
Calcium: 9 mg/dL (ref 8.9–10.3)
Chloride: 104 mmol/L (ref 98–111)
Creatinine, Ser: 0.92 mg/dL (ref 0.44–1.00)
GFR calc Af Amer: >60 mL/min (ref >60)
GFR calc non Af Amer: >60 mL/min (ref >60)
Glucose, Bld: 114 mg/dL — ABNORMAL HIGH (ref 70–99)
Potassium: 3.3 mmol/L — ABNORMAL LOW (ref 3.5–5.1)
Sodium: 139 mmol/L (ref 135–145)
Total Bilirubin: 0.7 mg/dL (ref 0.3–1.2)
Total Protein: 6.9 g/dL (ref 6.5–8.1)

## 2019-03-21 LAB — URINALYSIS, ROUTINE W REFLEX MICROSCOPIC
Bilirubin Urine: NEGATIVE
Glucose, UA: NEGATIVE mg/dL
Ketones, ur: NEGATIVE mg/dL
Nitrite: NEGATIVE
Protein, ur: NEGATIVE mg/dL
Specific Gravity, Urine: 1.016 (ref 1.005–1.030)
pH: 5 (ref 5.0–8.0)

## 2019-03-21 LAB — POC URINE PREG, ED: Preg Test, Ur: NEGATIVE

## 2019-03-21 LAB — LIPASE, BLOOD: Lipase: 33 U/L (ref 11–51)

## 2019-03-21 MED ORDER — IBUPROFEN 400 MG PO TABS
600.0000 mg | ORAL_TABLET | Freq: Once | ORAL | Status: DC
  Filled 2019-03-21: qty 1

## 2019-03-21 MED ORDER — SODIUM CHLORIDE 0.9 % IV BOLUS
1000.0000 mL | Freq: Once | INTRAVENOUS | Status: AC
  Administered 2019-03-21: 11:00:00 1000 mL via INTRAVENOUS

## 2019-03-21 MED ORDER — ACETAMINOPHEN 325 MG PO TABS
650.0000 mg | ORAL_TABLET | Freq: Once | ORAL | Status: AC
  Administered 2019-03-21: 650 mg via ORAL
  Filled 2019-03-21: qty 2

## 2019-03-21 MED ORDER — IOHEXOL 300 MG/ML  SOLN
100.0000 mL | Freq: Once | INTRAMUSCULAR | Status: AC | PRN
  Administered 2019-03-21: 100 mL via INTRAVENOUS

## 2019-03-21 NOTE — Telephone Encounter (Signed)
Was having vaginal bleeding but now having severe abdominal pain that started 2 days ago.  Pt reports that this pain is more severe than she has had with a ruptured ovarian cyst before.  Pt reports she is unable to lay down, unable to sleep, and pt crying d/t pain during the call.  Recommended that pt go to the ED to rule out ectopic pregnancy.  Pt verbalized understanding.

## 2019-03-21 NOTE — Telephone Encounter (Signed)
Pt went to ED and was diagnosed with large ovarian cyst.  Pt requesting birth control or "something" to shrink the cyst. Informed pt that she would need to speak with a provider to discuss management options.  Pt verbalized understanding and message sent to front office staff to schedule appointment.

## 2019-03-21 NOTE — Discharge Instructions (Signed)
You have been diagnosed today with Right-sided Ovarian Cyst.  At this time there does not appear to be the presence of an emergent medical condition, however there is always the potential for conditions to change. Please read and follow the below instructions.  Please return to the Emergency Department immediately for any new or worsening symptoms Please be sure to follow up with your Primary Care Provider within one week regarding your visit today; please call their office to schedule an appointment even if you are feeling better for a follow-up visit. You may continue to use your previously prescribed Percocets to help with severe pain, only uses medications as prescribed.  Avoid driving or operating heavy machinery or doing anything important while taking this medications as they will make you drowsy. Additionally you may use ibuprofen as directed on the packaging to help with your pain.  Please drink plenty water while taking ibuprofen.  Do not use Tylenol as your Percocet contains Tylenol within it. Please drink plenty water and get plenty rest over the next 2 days to help with your symptoms. Please follow-up with your OB/GYN.  Call their office today to schedule a follow-up appointment for this week or early next week to discuss your results.  Follow-up ultrasound is recommended for reevaluation of your ovarian cyst.  I have attached the ultrasound and CT scan readings below so that you may discuss them with your OB/GYN.  Get help right away if: You have belly pain that is very bad or gets worse. You cannot eat or drink without throwing up (vomiting). You suddenly get a fever. Your period is a lot heavier than usual. Any new/concerning or worsening symptoms  Please read the additional information packets attached to your discharge summary.  Do not take your medicine if  develop an itchy rash, swelling in your mouth or lips, or difficulty breathing.  ================ Ultrasound reading:  Complex cyst within the right ovary with what appears to be layering non dependent debris/soft tissue anteriorly which could be layering fat material related to a dermoid or mural soft tissue. I favor a complex benign cyst. This could be followed with repeat ultrasound in 3-6 months to assess stability. No evidence of torsion. IUD in expected position.  CT Reading: No acute CT finding. Right adnexal cyst/lesion, most likely hemorrhagic cyst given the finding on prior ultrasound.

## 2019-03-21 NOTE — ED Notes (Signed)
Patient transported to X-ray 

## 2019-03-21 NOTE — ED Notes (Signed)
Pt refuses motrin at this time.   Admits to taking motrin 15 minutes prior to arrival.   Too soon for additional dose.

## 2019-03-21 NOTE — ED Triage Notes (Signed)
Hx of endometriosisa and ruptured ovarian cyst.  Pain to RLQ abd radiating into her back.   No N/V/D.  Pt guarding at abdomen

## 2019-03-21 NOTE — ED Provider Notes (Signed)
[facility] EMERGENCY DEPARTMENT Provider Note   CSN: [id number] Arrival date & time: 03/21/19  8469    History   Chief Complaint No chief complaint on file.   HPI Kathryn Suarez is a 33 y.o. female present today for 2-day history of right lower quadrant abdominal pain.  Patient reports history of endometriosis and ovarian cysts.  She reports that her pain feels similar to previous ovarian cyst ruptures.  Patient describes a severe in intensity right lower quadrant pain that is sharp/throbbing in nature constant worsened with laying back and walking and improved slightly with sitting up straight and her home pain medicine.  She reports the pain radiates from the right lower quadrant around towards her back.  Patient delayed seeking medical attention due to current COVID-19 pandemic.  She was encouraged by her primary care provider to present to the ER for further evaluation via telephone.  Patient was prescribed Percocet on 03/15/2019 she states that she has been using this inconsistently, last taken 1 Percocet this morning upon awakening.  Additionally she took 1 Motrin just prior to arrival.  Additionally patient reports that with her endometriosis she has very irregular menstrual cycles and she has been having vaginal bleeding intermittently for the past few weeks and this is normal for her for the past 10 years and is without change.  She is unsure if she has been experiencing hematuria due to her vaginal bleeding she does state that her right lower quadrant pain causes her pain with urination however she denies burning with urination, she reports that these symptoms are similar to past ovarian cyst rupture.  Patient denies history of fever, nausea/vomiting, diarrhea, chest pain, shortness of breath, cough, body aches or any additional concerns.  Additionally patient denies any concern for sexually transmitted diseases and does not want testing, she states that she has been in  a monogamous relationship for the past several years with one female partner.  She denies any abnormal vaginal discharge or dyspareunia.    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   Right ovarian complex cyst with small mural nodule 09/12/2013   Menometrorrhagia 08/17/2013   Sickle cell anemia ([place]) 05/22/2013   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   Breast discharge 10/16/2011   Chronic pelvic pain in female 10/02/2011    Past Surgical History:  Procedure Laterality Date   DILATION AND CURETTAGE OF UTERUS  03/23/2007   EXCISION OF TONGUE LESION     LAPAROSCOPY N/A 06/01/2018   Procedure: LAPAROSCOPY DIAGNOSTIC, PERITONEAL BIOPSY;  Surgeon: Emily Filbert, MD;  Location: [place];  Service: Gynecology;  Laterality: N/A;   LAPAROSCOPY ABDOMEN DIAGNOSTIC N/A      OB History    Gravida  0   Para      Term      Preterm      AB      Living        SAB      TAB  Ectopic      Multiple      Live Births               Home Medications    Prior to Admission medications   Medication Sig Start Date End Date Taking? Authorizing Provider  norgestrel-ethinyl estradiol (LO/OVRAL,CRYSELLE) 0.3-30 MG-MCG tablet Take 1 tablet by mouth daily. 12/23/18   Emily Filbert, MD  oxyCODONE-acetaminophen (PERCOCET/ROXICET) 5-325 MG tablet Take 3 tablets by mouth every 6 (six) hours as needed for severe pain (gets this at the pain clinic).    [provider]  predniSONE (STERAPRED UNI-PAK 21 TAB) 10 MG (21) TBPK tablet 6 tabs  for 1 day, then 5 tabs for 1 das, then 4 tabs for 1 day, then 3 tabs for 1 day, 2 tabs for 1 day, then 1 tab for 1 day 03/17/19   Orvan July, NP    Family History Family History  Problem Relation Age of Onset   Hypertension Mother    Sickle cell trait Father     Social History Social History   Tobacco Use   Smoking status: Never Smoker   Smokeless tobacco: Never Used  Substance Use Topics   Alcohol use: No    Comment: occas   Drug use: No     Allergies   Latex   Review of Systems Review of Systems  Constitutional: Negative.  Negative for chills, diaphoresis and fever.  Respiratory: Negative.  Negative for cough and shortness of breath.   Cardiovascular: Negative.  Negative for chest pain.  Gastrointestinal: Positive for abdominal pain. Negative for blood in stool, diarrhea, nausea and vomiting.  Genitourinary: Positive for hematuria (Questionable whether vaginal bleeding versus hematuria) and vaginal bleeding (Reports as her normal regular menstrual cycle). Negative for dysuria, flank pain, genital sores, vaginal discharge and vaginal pain.  Musculoskeletal: Negative.  Negative for arthralgias and myalgias.  Neurological: Negative.  Negative for weakness and headaches.  All other systems reviewed and are negative.  Physical Exam Updated Vital Signs BP 110/76 (BP Location: Left Arm)    Pulse 82    Temp 98.6 F (37 C) (Oral)    Resp 16    Ht 5\' 3"  (1.6 m)    Wt 85.3 kg    LMP 03/19/2019 (Exact Date)    SpO2 99%    BMI 33.30 kg/m   Physical Exam Constitutional:      General: She is not in acute distress.    Appearance: Normal appearance. She is well-developed. She is obese. She is not ill-appearing or diaphoretic.  HENT:     Head: Normocephalic and atraumatic.     Right Ear: External ear normal.     Left Ear: External ear normal.     Nose: Nose normal.     Mouth/Throat:     Mouth: Mucous membranes are moist.     Pharynx: Oropharynx is clear.  Eyes:      General: Vision grossly intact. Gaze aligned appropriately.     Conjunctiva/sclera: Conjunctivae normal.     Pupils: Pupils are equal, round, and reactive to light.  Neck:     Musculoskeletal: Normal range of motion and neck supple.     Trachea: Trachea and phonation normal. No tracheal deviation.  Cardiovascular:     Rate and Rhythm: Normal rate and regular rhythm.     Pulses: Normal pulses.          Dorsalis pedis pulses are 2+ on the right side and 2+ on the left side.  Posterior tibial pulses are 2+ on the right side and 2+ on the left side.     Heart sounds: Normal heart sounds.  Pulmonary:     Effort: Pulmonary effort is normal. No respiratory distress.     Breath sounds: Normal breath sounds.  Abdominal:     General: Bowel sounds are normal. There is no distension.     Palpations: Abdomen is soft.     Tenderness: There is abdominal tenderness in the right lower quadrant. There is no right CVA tenderness, left CVA tenderness, guarding or rebound. Positive signs include McBurney's sign. Negative signs include Murphy's sign.  Genitourinary:    Comments: Deferred by patient Musculoskeletal: Normal range of motion.        General: No tenderness.     Right lower leg: She exhibits no tenderness. No edema.     Left lower leg: She exhibits no tenderness. No edema.  Feet:     Right foot:     Protective Sensation: 3 sites tested. 3 sites sensed.     Left foot:     Protective Sensation: 3 sites tested. 3 sites sensed.  Skin:    General: Skin is warm and dry.  Neurological:     Mental Status: She is alert.     GCS: GCS eye subscore is 4. GCS verbal subscore is 5. GCS motor subscore is 6.     Comments: Speech is clear and goal oriented, follows commands Major Cranial nerves without deficit, no facial droop Moves extremities without ataxia, coordination intact Normal gait  Psychiatric:        Behavior: Behavior normal.    ED Treatments / Results  Labs (all labs ordered are  listed, but only abnormal results are displayed) Labs Reviewed  COMPREHENSIVE METABOLIC PANEL - Abnormal; Notable for the following components:      Result Value   Potassium 3.3 (*)    Glucose, Bld 114 (*)    All other components within normal limits  URINALYSIS, ROUTINE W REFLEX MICROSCOPIC - Abnormal; Notable for the following components:   APPearance HAZY (*)    Hgb urine dipstick MODERATE (*)    Leukocytes,Ua TRACE (*)    Bacteria, UA RARE (*)    All other components within normal limits  CBC WITH DIFFERENTIAL/PLATELET  LIPASE, BLOOD  POC URINE PREG, ED  POC URINE PREG, ED    EKG None  Radiology US Transvaginal Non-ob  Result Date: 03/21/2019 CLINICAL DATA:  Pelvic pain for 2 days EXAM: TRANSABDOMINAL AND TRANSVAGINAL ULTRASOUND OF PELVIS DOPPLER ULTRASOUND OF OVARIES TECHNIQUE: Both transabdominal and transvaginal ultrasound examinations of the pelvis were performed. Transabdominal technique was performed for global imaging of the pelvis including uterus, ovaries, adnexal regions, and pelvic cul-de-sac. It was necessary to proceed with endovaginal exam following the transabdominal exam to visualize the endometrium and ovaries. Color and duplex Doppler ultrasound was utilized to evaluate blood flow to the ovaries. COMPARISON:  11/19/2017 FINDINGS: Uterus Measurements: 8.4 x 3.1 x 4.6 cm = volume: 62 mL. No fibroids or other mass visualized. Endometrium Thickness: 5 mm in thickness. IUD in place. No focal abnormality visualized. Right ovary Measurements: 9.7 x 5.1 x 6.5 cm = volume: 5.6 x 5.2 x 4.7 cm complex cyst within the right ovary. There appears to be non dependent debris or soft tissue noted along the anterior wall of this cyst. It is unclear if this could be bloating fatty material or soft tissue nodularity. Left ovary Measurements: 5.0 x 2.8 x 3.1 cm = volume:  22 mL. Normal appearance/no adnexal mass. Pulsed Doppler evaluation of both ovaries demonstrates normal low-resistance  arterial and venous waveforms. Other findings Small amount of free fluid in the pelvis. IMPRESSION: Complex cyst within the right ovary with what appears to be layering non dependent debris/soft tissue anteriorly which could be layering fat material related to a dermoid or mural soft tissue. I favor a complex benign cyst. This could be followed with repeat ultrasound in 3-6 months to assess stability. No evidence of torsion. IUD in expected position. Electronically Signed   By: Rolm Baptise M.D.   On: 03/21/2019 11:42   US Pelvis Complete  Result Date: 03/21/2019 CLINICAL DATA:  Pelvic pain for 2 days EXAM: TRANSABDOMINAL AND TRANSVAGINAL ULTRASOUND OF PELVIS DOPPLER ULTRASOUND OF OVARIES TECHNIQUE: Both transabdominal and transvaginal ultrasound examinations of the pelvis were performed. Transabdominal technique was performed for global imaging of the pelvis including uterus, ovaries, adnexal regions, and pelvic cul-de-sac. It was necessary to proceed with endovaginal exam following the transabdominal exam to visualize the endometrium and ovaries. Color and duplex Doppler ultrasound was utilized to evaluate blood flow to the ovaries. COMPARISON:  11/19/2017 FINDINGS: Uterus Measurements: 8.4 x 3.1 x 4.6 cm = volume: 62 mL. No fibroids or other mass visualized. Endometrium Thickness: 5 mm in thickness. IUD in place. No focal abnormality visualized. Right ovary Measurements: 9.7 x 5.1 x 6.5 cm = volume: 5.6 x 5.2 x 4.7 cm complex cyst within the right ovary. There appears to be non dependent debris or soft tissue noted along the anterior wall of this cyst. It is unclear if this could be bloating fatty material or soft tissue nodularity. Left ovary Measurements: 5.0 x 2.8 x 3.1 cm = volume: 22 mL. Normal appearance/no adnexal mass. Pulsed Doppler evaluation of both ovaries demonstrates normal low-resistance arterial and venous waveforms. Other findings Small amount of free fluid in the pelvis. IMPRESSION: Complex  cyst within the right ovary with what appears to be layering non dependent debris/soft tissue anteriorly which could be layering fat material related to a dermoid or mural soft tissue. I favor a complex benign cyst. This could be followed with repeat ultrasound in 3-6 months to assess stability. No evidence of torsion. IUD in expected position. Electronically Signed   By: Rolm Baptise M.D.   On: 03/21/2019 11:42   Ct Abdomen Pelvis W Contrast  Result Date: 03/21/2019 CLINICAL DATA:  33 year old female with endometriosis EXAM: CT ABDOMEN AND PELVIS WITH CONTRAST TECHNIQUE: Multidetector CT imaging of the abdomen and pelvis was performed using the standard protocol following bolus administration of intravenous contrast. CONTRAST:  14mL OMNIPAQUE IOHEXOL 300 MG/ML  SOLN COMPARISON:  Same day ultrasound FINDINGS: Lower chest: No acute finding of the lower chest Hepatobiliary: Unremarkable liver.  Unremarkable gallbladder Pancreas: Unremarkable pancreas Spleen: Unremarkable Adrenals/Urinary Tract: Unremarkable appearance of the adrenal glands. No evidence of hydronephrosis of the right or left kidney. No nephrolithiasis. Unremarkable course of the bilateral ureters. Unremarkable appearance of the urinary bladder. Stomach/Bowel: Unremarkable stomach. Unremarkable small bowel. No transition point. No focal wall thickening. Normal appendix. Moderate stool burden. No inflammatory changes. Vascular/Lymphatic: No significant atherosclerotic changes. Unremarkable venous structures. No adenopathy. Reproductive: Intrauterine device in place. Follicular changes of the left adnexa. CT demonstrates heterogeneously intermediate density cyst/lesion of the right adnexa, measuring 8.9 cm in greatest diameter on the axial images. No calcifications or macroscopic fat appreciated Other: None Musculoskeletal: No displaced fracture. No significant degenerative changes. IMPRESSION: No acute CT finding. Right adnexal cyst/lesion, most  likely hemorrhagic  cyst given the finding on prior ultrasound. Given the size, referral for Ob GYN follow-up is recommended to initiate surveillance. Electronically Signed   By: Corrie Mckusick D.O.   On: 03/21/2019 12:38   [place] Art/ven Flow Abd Pelv Doppler  Result Date: 03/21/2019 CLINICAL DATA:  Pelvic pain for 2 days EXAM: TRANSABDOMINAL AND TRANSVAGINAL ULTRASOUND OF PELVIS DOPPLER ULTRASOUND OF OVARIES TECHNIQUE: Both transabdominal and transvaginal ultrasound examinations of the pelvis were performed. Transabdominal technique was performed for global imaging of the pelvis including uterus, ovaries, adnexal regions, and pelvic cul-de-sac. It was necessary to proceed with endovaginal exam following the transabdominal exam to visualize the endometrium and ovaries. Color and duplex Doppler ultrasound was utilized to evaluate blood flow to the ovaries. COMPARISON:  11/19/2017 FINDINGS: Uterus Measurements: 8.4 x 3.1 x 4.6 cm = volume: 62 mL. No fibroids or other mass visualized. Endometrium Thickness: 5 mm in thickness. IUD in place. No focal abnormality visualized. Right ovary Measurements: 9.7 x 5.1 x 6.5 cm = volume: 5.6 x 5.2 x 4.7 cm complex cyst within the right ovary. There appears to be non dependent debris or soft tissue noted along the anterior wall of this cyst. It is unclear if this could be bloating fatty material or soft tissue nodularity. Left ovary Measurements: 5.0 x 2.8 x 3.1 cm = volume: 22 mL. Normal appearance/no adnexal mass. Pulsed Doppler evaluation of both ovaries demonstrates normal low-resistance arterial and venous waveforms. Other findings Small amount of free fluid in the pelvis. IMPRESSION: Complex cyst within the right ovary with what appears to be layering non dependent debris/soft tissue anteriorly which could be layering fat material related to a dermoid or mural soft tissue. I favor a complex benign cyst. This could be followed with repeat ultrasound in 3-6 months to assess  stability. No evidence of torsion. IUD in expected position. Electronically Signed   By: Rolm Baptise M.D.   On: 03/21/2019 11:42    Procedures Procedures (including critical care time)  Medications Ordered in ED Medications  ibuprofen (ADVIL,MOTRIN) tablet 600 mg (600 mg Oral Refused 03/21/19 1054)  sodium chloride 0.9 % bolus 1,000 mL (0 mLs Intravenous Stopped 03/21/19 1300)  iohexol (OMNIPAQUE) 300 MG/ML solution 100 mL (100 mLs Intravenous Contrast Given 03/21/19 1216)  acetaminophen (TYLENOL) tablet 650 mg (650 mg Oral Given 03/21/19 1241)     Initial Impression / Assessment and Plan / ED Course  I have reviewed the triage vital signs and the nursing notes.  Pertinent labs & imaging results that were available during my care of the patient were reviewed by me and considered in my medical decision making (see chart for details).    CBC within normal limits CMP nonacute Lipase within normal limits Urine pregnancy negative Urinalysis unremarkable, no signs of infection Pelvic ultrasound with transvaginal/Doppler:  IMPRESSION:  Complex cyst within the right ovary with what appears to be layering  non dependent debris/soft tissue anteriorly which could be layering  fat material related to a dermoid or mural soft tissue. I favor a  complex benign cyst. This could be followed with repeat ultrasound  in 3-6 months to assess stability.    No evidence of torsion.    IUD in expected position.  -------------------------------------- Patient reassessed, some improvement of pain following fluid bolus today she is resting comfortably however still endorses right lower quadrant pain.  As patient had 1 Percocet this morning will give additional Tylenol here today for her pain she has refused narcotic medications during this visit. Discussed  case with Dr. Ellender Hose, will pursue CT abdomen pelvis to evaluate for appendicitis.  CT abdomen pelvis:  IMPRESSION:  No acute CT finding.    Right  adnexal cyst/lesion, most likely hemorrhagic cyst given the  finding on prior ultrasound. Given the size, referral for Ob GYN  follow-up is recommended to initiate surveillance.  --------------------------------------- Patient updated on results today, she states understanding.  Patient ports that she has OB/GYN follow-up however would like referral to the women's clinic as well this is been given.  Patient counseled on home medication use and as she has recently been prescribed 120 Percocet do not feel that it is appropriate to provide her with more pain medication at this time she states understanding and is agreeable to plan of care.  Patient aware of need for follow-up with OB/GYN and ultrasound.  Vital signs have been stable she is well-appearing, nontoxic and in no acute distress reassessment of the abdomen is soft and without peritoneal signs.  Patient deferred pelvic examination today and denies any potential concern for sexually transmitted diseases, failure is reasonable at this time I still encouraged patient to follow-up with her OB/GYN for further evaluation.  Doubt PID or TOA at this time.  At this time there does not appear to be any evidence of an acute emergency medical condition and the patient appears stable for discharge with appropriate outpatient follow up. Diagnosis was discussed with patient who verbalizes understanding of care plan and is agreeable to discharge. I have discussed return precautions with patient who verbalizes understanding of return precautions. Patient encouraged to follow-up with their PCP and OBGYN. All questions answered.  Patient has been discharged in good condition.  Patient's case rediscussed with Dr. Ellender Hose who agrees with plan to discharge with follow-up.   Note: Portions of this report may have been transcribed using voice recognition software. Every effort was made to ensure accuracy; however, inadvertent computerized transcription errors may still be  present.  Final Clinical Impressions(s) / ED Diagnoses   Final diagnoses:  Cyst of right ovary    ED Discharge Orders    None       Gari Crown 03/21/19 1318    Duffy Bruce, MD 03/22/19 [phone]

## 2019-04-05 ENCOUNTER — Telehealth: Payer: Self-pay | Admitting: Obstetrics and Gynecology

## 2019-04-05 ENCOUNTER — Telehealth: Payer: Self-pay | Admitting: Obstetrics & Gynecology

## 2019-04-05 ENCOUNTER — Other Ambulatory Visit: Payer: Self-pay

## 2019-04-05 ENCOUNTER — Ambulatory Visit (INDEPENDENT_AMBULATORY_CARE_PROVIDER_SITE_OTHER): Payer: Medicaid Other | Admitting: Obstetrics & Gynecology

## 2019-04-05 DIAGNOSIS — N83201 Unspecified ovarian cyst, right side: Secondary | ICD-10-CM

## 2019-04-05 NOTE — Progress Notes (Signed)
TELEHEALTH VIRTUAL GYNECOLOGY VISIT ENCOUNTER NOTE  I connected with Kathryn Suarez on 04/05/19 at  9:15 AM EDT by telephone at home and verified that I am speaking with the correct person using two identifiers.   I discussed the limitations, risks, security and privacy concerns of performing an evaluation and management service by telephone and the availability of in person appointments. I also discussed with the patient that there may be a patient responsible charge related to this service. The patient expressed understanding and agreed to proceed.   History:  Kathryn Suarez is a 33 y.o. G0P0 (adopted 60 yo son)  female being evaluated today for RLQ pain. She was seen in the ER on 03/21/19 for RLQ pain and a large 10 cm ovarian cyst was seen. Her pain got somewhat better until about 2 days when it became bad. She reports that the pain is worse with voiding, passing gas, "anytime I push". She can go "weeks without pooping." She reports that her last BM was at least a week ago.     Past Medical History:  Diagnosis Date   Anemia    Asthma    Chronic pelvic pain in female    Endometriosis    Hypertension    not currently being treated at this time- weight loss    Kidney stones    Rash 12/11/2017   legs, arms, trunk   Sickle cell trait ([place])    Past Surgical History:  Procedure Laterality Date   DILATION AND CURETTAGE OF UTERUS  03/23/2007   EXCISION OF TONGUE LESION     LAPAROSCOPY N/A 06/01/2018   Procedure: LAPAROSCOPY DIAGNOSTIC, PERITONEAL BIOPSY;  Surgeon: Emily Filbert, MD;  Location: [facility];  Service: Gynecology;  Laterality: N/A;   LAPAROSCOPY ABDOMEN DIAGNOSTIC N/A    The following portions of the patient's history were reviewed and updated as appropriate: allergies, current medications, past family history, past medical history, past social history, past surgical history and problem list.     Review of Systems:  Pertinent items noted in  HPI and remainder of comprehensive ROS otherwise negative.  Physical Exam:   General:  Alert, oriented and cooperative.   Mental Status: Normal mood and affect perceived. Normal judgment and thought content.  Physical exam deferred due to nature of the encounter  Labs and Imaging No results found for this or any previous visit (from the past 336 hour(s)). US Transvaginal Non-ob  Result Date: 03/21/2019 CLINICAL DATA:  Pelvic pain for 2 days EXAM: TRANSABDOMINAL AND TRANSVAGINAL ULTRASOUND OF PELVIS DOPPLER ULTRASOUND OF OVARIES TECHNIQUE: Both transabdominal and transvaginal ultrasound examinations of the pelvis were performed. Transabdominal technique was performed for global imaging of the pelvis including uterus, ovaries, adnexal regions, and pelvic cul-de-sac. It was necessary to proceed with endovaginal exam following the transabdominal exam to visualize the endometrium and ovaries. Color and duplex Doppler ultrasound was utilized to evaluate blood flow to the ovaries. COMPARISON:  11/19/2017 FINDINGS: Uterus Measurements: 8.4 x 3.1 x 4.6 cm = volume: 62 mL. No fibroids or other mass visualized. Endometrium Thickness: 5 mm in thickness. IUD in place. No focal abnormality visualized. Right ovary Measurements: 9.7 x 5.1 x 6.5 cm = volume: 5.6 x 5.2 x 4.7 cm complex cyst within the right ovary. There appears to be non dependent debris or soft tissue noted along the anterior wall of this cyst. It is unclear if this could be bloating fatty material or soft tissue nodularity. Left ovary Measurements: 5.0 x  2.8 x 3.1 cm = volume: 22 mL. Normal appearance/no adnexal mass. Pulsed Doppler evaluation of both ovaries demonstrates normal low-resistance arterial and venous waveforms. Other findings Small amount of free fluid in the pelvis. IMPRESSION: Complex cyst within the right ovary with what appears to be layering non dependent debris/soft tissue anteriorly which could be layering fat material related to a  dermoid or mural soft tissue. I favor a complex benign cyst. This could be followed with repeat ultrasound in 3-6 months to assess stability. No evidence of torsion. IUD in expected position. Electronically Signed   By: Rolm Baptise M.D.   On: 03/21/2019 11:42   US Pelvis Complete  Result Date: 03/21/2019 CLINICAL DATA:  Pelvic pain for 2 days EXAM: TRANSABDOMINAL AND TRANSVAGINAL ULTRASOUND OF PELVIS DOPPLER ULTRASOUND OF OVARIES TECHNIQUE: Both transabdominal and transvaginal ultrasound examinations of the pelvis were performed. Transabdominal technique was performed for global imaging of the pelvis including uterus, ovaries, adnexal regions, and pelvic cul-de-sac. It was necessary to proceed with endovaginal exam following the transabdominal exam to visualize the endometrium and ovaries. Color and duplex Doppler ultrasound was utilized to evaluate blood flow to the ovaries. COMPARISON:  11/19/2017 FINDINGS: Uterus Measurements: 8.4 x 3.1 x 4.6 cm = volume: 62 mL. No fibroids or other mass visualized. Endometrium Thickness: 5 mm in thickness. IUD in place. No focal abnormality visualized. Right ovary Measurements: 9.7 x 5.1 x 6.5 cm = volume: 5.6 x 5.2 x 4.7 cm complex cyst within the right ovary. There appears to be non dependent debris or soft tissue noted along the anterior wall of this cyst. It is unclear if this could be bloating fatty material or soft tissue nodularity. Left ovary Measurements: 5.0 x 2.8 x 3.1 cm = volume: 22 mL. Normal appearance/no adnexal mass. Pulsed Doppler evaluation of both ovaries demonstrates normal low-resistance arterial and venous waveforms. Other findings Small amount of free fluid in the pelvis. IMPRESSION: Complex cyst within the right ovary with what appears to be layering non dependent debris/soft tissue anteriorly which could be layering fat material related to a dermoid or mural soft tissue. I favor a complex benign cyst. This could be followed with repeat ultrasound in  3-6 months to assess stability. No evidence of torsion. IUD in expected position. Electronically Signed   By: Rolm Baptise M.D.   On: 03/21/2019 11:42   Ct Abdomen Pelvis W Contrast  Result Date: 03/21/2019 CLINICAL DATA:  33 year old female with endometriosis EXAM: CT ABDOMEN AND PELVIS WITH CONTRAST TECHNIQUE: Multidetector CT imaging of the abdomen and pelvis was performed using the standard protocol following bolus administration of intravenous contrast. CONTRAST:  177mL OMNIPAQUE IOHEXOL 300 MG/ML  SOLN COMPARISON:  Same day ultrasound FINDINGS: Lower chest: No acute finding of the lower chest Hepatobiliary: Unremarkable liver.  Unremarkable gallbladder Pancreas: Unremarkable pancreas Spleen: Unremarkable Adrenals/Urinary Tract: Unremarkable appearance of the adrenal glands. No evidence of hydronephrosis of the right or left kidney. No nephrolithiasis. Unremarkable course of the bilateral ureters. Unremarkable appearance of the urinary bladder. Stomach/Bowel: Unremarkable stomach. Unremarkable small bowel. No transition point. No focal wall thickening. Normal appendix. Moderate stool burden. No inflammatory changes. Vascular/Lymphatic: No significant atherosclerotic changes. Unremarkable venous structures. No adenopathy. Reproductive: Intrauterine device in place. Follicular changes of the left adnexa. CT demonstrates heterogeneously intermediate density cyst/lesion of the right adnexa, measuring 8.9 cm in greatest diameter on the axial images. No calcifications or macroscopic fat appreciated Other: None Musculoskeletal: No displaced fracture. No significant degenerative changes. IMPRESSION: No acute CT finding. Right  adnexal cyst/lesion, most likely hemorrhagic cyst given the finding on prior ultrasound. Given the size, referral for Ob GYN follow-up is recommended to initiate surveillance. Electronically Signed   By: Corrie Mckusick D.O.   On: 03/21/2019 12:38   [place] Art/ven Flow Abd Pelv Doppler  Result  Date: 03/21/2019 CLINICAL DATA:  Pelvic pain for 2 days EXAM: TRANSABDOMINAL AND TRANSVAGINAL ULTRASOUND OF PELVIS DOPPLER ULTRASOUND OF OVARIES TECHNIQUE: Both transabdominal and transvaginal ultrasound examinations of the pelvis were performed. Transabdominal technique was performed for global imaging of the pelvis including uterus, ovaries, adnexal regions, and pelvic cul-de-sac. It was necessary to proceed with endovaginal exam following the transabdominal exam to visualize the endometrium and ovaries. Color and duplex Doppler ultrasound was utilized to evaluate blood flow to the ovaries. COMPARISON:  11/19/2017 FINDINGS: Uterus Measurements: 8.4 x 3.1 x 4.6 cm = volume: 62 mL. No fibroids or other mass visualized. Endometrium Thickness: 5 mm in thickness. IUD in place. No focal abnormality visualized. Right ovary Measurements: 9.7 x 5.1 x 6.5 cm = volume: 5.6 x 5.2 x 4.7 cm complex cyst within the right ovary. There appears to be non dependent debris or soft tissue noted along the anterior wall of this cyst. It is unclear if this could be bloating fatty material or soft tissue nodularity. Left ovary Measurements: 5.0 x 2.8 x 3.1 cm = volume: 22 mL. Normal appearance/no adnexal mass. Pulsed Doppler evaluation of both ovaries demonstrates normal low-resistance arterial and venous waveforms. Other findings Small amount of free fluid in the pelvis. IMPRESSION: Complex cyst within the right ovary with what appears to be layering non dependent debris/soft tissue anteriorly which could be layering fat material related to a dermoid or mural soft tissue. I favor a complex benign cyst. This could be followed with repeat ultrasound in 3-6 months to assess stability. No evidence of torsion. IUD in expected position. Electronically Signed   By: Rolm Baptise M.D.   On: 03/21/2019 11:42      Assessment and Plan:     Chronic constipation- this surely is contributing to her pelvic pain. She will go get Miralax today and use  it TID until BM, then daily.  Large right ovarian cyst- I have ordered another u/s for 6 weeks from now. She is already taking OCPs. She also has an IUD in. Rec IUD removal asap (she says that she cannot come in today) Torsion precautions Continue naproxyn, she has percocet, goes to the pain clinic  Endometriosis- continue OCPs      I discussed the assessment and treatment plan with the patient. The patient was provided an opportunity to ask questions and all were answered. The patient agreed with the plan and demonstrated an understanding of the instructions.   The patient was advised to call back or seek an in-person evaluation/go to the ED if the symptoms worsen or if the condition fails to improve as anticipated.  I provided 10 minutes of non-face-to-face time during this encounter.   Emily Filbert, MD [facility], [place]

## 2019-04-05 NOTE — Telephone Encounter (Signed)
Attempted to call patient with her IUD removal appointment on 4/22 @ 1:35. No answer, left voicemail with appointment information and new address. Advised to give the office a call if needing to reschedule

## 2019-04-06 ENCOUNTER — Ambulatory Visit: Payer: Medicaid Other | Admitting: Obstetrics & Gynecology

## 2019-04-11 NOTE — Telephone Encounter (Signed)
Opened in error

## 2019-08-15 HISTORY — PX: LAPAROSCOPIC OOPHERECTOMY: SHX6507

## 2019-08-22 ENCOUNTER — Telehealth: Payer: Self-pay

## 2019-08-22 NOTE — Telephone Encounter (Signed)
Pt called in [place] desk complaining about pain and asking to speak with a nurse. Patient informed me that she was in a lot of pain and wanted to schedule an appointment with Dr.Dove for a surgical consult. States she is still bleeding and is not sure if she wants a hysterectomy.but that she wants her tubes removed. Advised that that will schedule her with an MD as soon as possible. Pt verbalized understanding.

## 2019-08-24 ENCOUNTER — Telehealth: Payer: Self-pay | Admitting: Obstetrics & Gynecology

## 2019-08-24 NOTE — Telephone Encounter (Signed)
Stated she is in pain due to an emergency surgery regarding a cyst wrapping around her ovary. She stated the right ovary was removed. She also stated she was told if she needs anything follow up with her doctor. She stated she has some percocet's and the physician prescribed her some meds so that she doesn't get an infection and also ibuprofen but her medicaid does not cover the medication. She would like a call back and the medication sent to a pharmacy in [facility].

## 2019-08-24 NOTE — Telephone Encounter (Signed)
Called patient and she states she had to have emergency surgery and was given a couple antibiotics to take. She states her insurance doesn't work in [place] and she hasn't gotten home yet. Recommended she look on goodrx for discount pricing to pay out of pocket. Patient verbalized understanding & states they also recommended she follow up with her GYN doctor within a couple weeks after surgery. Told patient I will have someone from the front office reach out to her with an appt. Patient verbalized understanding & had no questions.

## 2019-08-31 ENCOUNTER — Telehealth: Payer: Self-pay | Admitting: Obstetrics and Gynecology

## 2019-08-31 NOTE — Telephone Encounter (Signed)
Called the patient to confirm the appointment and complete the covid screening. Received the message were sorry your call can not be completed at this time. Please hang up and try your call again later.

## 2019-09-01 ENCOUNTER — Encounter: Payer: Self-pay | Admitting: Obstetrics and Gynecology

## 2019-09-01 ENCOUNTER — Other Ambulatory Visit: Payer: Self-pay

## 2019-09-01 ENCOUNTER — Ambulatory Visit (INDEPENDENT_AMBULATORY_CARE_PROVIDER_SITE_OTHER): Payer: Medicaid Other | Admitting: Obstetrics and Gynecology

## 2019-09-01 DIAGNOSIS — Z9889 Other specified postprocedural states: Secondary | ICD-10-CM

## 2019-09-04 ENCOUNTER — Telehealth: Payer: Self-pay | Admitting: Clinical

## 2019-09-04 NOTE — Progress Notes (Signed)
Ms Praytor presents for post op appt. Pt had LSO last week in [place] d/t ovarian torsion.  She reports overall doing well. Still sore at times but this has improved. Pain meds as needed. Tolerating diet, denies bowel or bladder dysfunction  PE AR VSS Lungs clear Heart RRR Abd soft + BS incision sites healing well without evidence of infection GU deferred  A/P Post OP from LSO  Over all doing well. Continue to increase ADL's as tolerates. F/U in 3-4 weeks for routine post op.

## 2019-09-04 NOTE — Telephone Encounter (Signed)
Followed up with pt after positive depression screen; pt attributes feelings of depression and anxiety to recent surgery, and agrees to make an appointment with [facility] by calling [phone], and asking to be put on Amiya Escamilla's schedule  if symptoms increase or do not improve.

## 2019-09-05 ENCOUNTER — Telehealth: Payer: Self-pay | Admitting: *Deleted

## 2019-09-05 ENCOUNTER — Telehealth: Payer: Self-pay | Admitting: Obstetrics and Gynecology

## 2019-09-05 NOTE — Telephone Encounter (Signed)
I called Kathryn Suarez and she reports she has sharp pains on her side where the surgery is. States is not all the time; but hurts when she gets up and moves around. She states she is voiding and having bm's fine. She is taking ibuprofen 800 2-3 times a day and oxycodone as needed. She denies that her incision is open/ red/ swollen .  I informed her we can see her in the office tomorrow at 2:35 or if she feels her pain is too severe she can go to the hospital for evaluation. She states she is going to her pain doctor tomorrow at 2:30 and asked appointment to be changed. I asked registrars to change it and they moved it to 09/07/19.  Esaiah Wanless,RN

## 2019-09-05 NOTE — Telephone Encounter (Signed)
Kathryn Suarez left a voice message this am asking for a nurse to call her back- call was cutting in/out .

## 2019-09-05 NOTE — Telephone Encounter (Signed)
Nurse Vaughan Basta requested the patient be scheduled with any MD as the patient called in stating she is experiencing bad post op. Pain. The intent of scheduling with any MD is to avoid sending the patient to the hospital.

## 2019-09-06 ENCOUNTER — Telehealth: Payer: Self-pay | Admitting: Obstetrics & Gynecology

## 2019-09-06 ENCOUNTER — Ambulatory Visit: Payer: Medicaid Other | Admitting: Obstetrics & Gynecology

## 2019-09-06 NOTE — Telephone Encounter (Signed)
Attempted to call patient x2 about her appointment on 9/24 @ 9:35. First attempt someone picked up and then hung up the phone. Second attempt, no answer left voicemail instructing patient to wear a face mask for the entire appointment and no visitors are allowed during the visit. Patient instructed not to attend the appointment if she was any symptoms. Symptom list and office number left.

## 2019-09-07 ENCOUNTER — Ambulatory Visit: Payer: Medicaid Other | Admitting: Obstetrics & Gynecology

## 2019-09-20 ENCOUNTER — Telehealth: Payer: Self-pay | Admitting: Family Medicine

## 2019-09-20 NOTE — Telephone Encounter (Signed)
Called the patient to confirm the appointment. The patient answered no to the covid19 screening questions. Also advised the patient of no children or visitors due to covid19 restrictions.

## 2019-09-21 ENCOUNTER — Other Ambulatory Visit: Payer: Self-pay

## 2019-09-21 ENCOUNTER — Ambulatory Visit (INDEPENDENT_AMBULATORY_CARE_PROVIDER_SITE_OTHER): Payer: Medicaid Other | Admitting: Obstetrics & Gynecology

## 2019-09-21 ENCOUNTER — Encounter: Payer: Self-pay | Admitting: Obstetrics & Gynecology

## 2019-09-21 VITALS — BP 137/85 | HR 103 | Wt 189.7 lb

## 2019-09-21 DIAGNOSIS — Z9889 Other specified postprocedural states: Secondary | ICD-10-CM | POA: Diagnosis not present

## 2019-09-21 DIAGNOSIS — N809 Endometriosis, unspecified: Secondary | ICD-10-CM | POA: Diagnosis not present

## 2019-09-21 MED ORDER — ORILISSA 150 MG PO TABS: 1.0000 | ORAL_TABLET | Freq: Every day | ORAL | 6 refills | Status: DC

## 2019-09-21 NOTE — Progress Notes (Signed)
   Subjective:    Patient ID: Kathryn Suarez, female    DOB: 1986-02-19, 33 y.o.   MRN: [id number]  HPI 33 yo G52 (adopted 53 yo son) here for a post op visit. She had an emergency Left oopherectomy a month ago in [place] for torsion. She says that they also removed some endometriosis.   I did a diagnostic laparoscopy 6/19 and biopsies showed endometriosis.   Currently, she is doing ok but is having hurting/cramping at the end of voiding.  She has had a Mirena in for 3-4 years. She has been having some bleeding since her surgery. She still has cramping pain with orgasm, cannot tolerate penetration due to pain. She has not tried [place], unfortunately, she has Medicaid.  Review of Systems She is a same sex relationship.   She and her partner are in the process of adopting a 74 yo daughter.  Objective:   Physical Exam .Breathing, conversing, and ambulating normally Well nourished, well hydrated Black female, no apparent distress Abd- benign Incisions- healed well    Assessment & Plan:  She declines flu vaccine. Endometriosis pain- trial of Orilissa 150 mg daily I will try to bring samples from [place] here for here.

## 2019-10-25 ENCOUNTER — Telehealth: Payer: Self-pay | Admitting: Obstetrics and Gynecology

## 2019-10-25 DIAGNOSIS — Z9889 Other specified postprocedural states: Secondary | ICD-10-CM

## 2019-10-25 NOTE — Telephone Encounter (Signed)
Received a call from [place] stating she needed a note from Dr. Rip Harbour informing her insurance that she did not go to [place] to have surgery, but she was already there and had emergency surgery. Requesting a call back from clinical staff.

## 2019-10-30 NOTE — Telephone Encounter (Signed)
Called patient stating I am returning her phone call. Patient states her insurance is not wanting to pay for her surgery without proof that it was truly emergent. Patient is requesting a letter from [place] stating this. Discussed with Cathlean Marseilles who states letter can be produced for patient.  Asked patient if she wants the letter to be vague and only state that emergent surgery was needed or if she wants me to be more detailed about what happened and what surgery was needed. Patient states please list out everything that happened. Told patient I would create letter for her and leave it at the front desk for her to pick up. Patient verbalized understanding & had no questions.

## 2019-12-14 ENCOUNTER — Telehealth: Payer: Self-pay | Admitting: Obstetrics & Gynecology

## 2019-12-14 NOTE — Telephone Encounter (Signed)
Patient called in stating that she would like to speak to a nurse. She had surgery a few months back and is starting to experience some pain again. Patient stated that Dr. Hulan Fray informed her to let her know when she starts having this type of pain again before it gets worse. Patient instructed that I would put the message in to the nurses and they will be reaching back out to her. Patient informed that the office does close at 12 pm today and closed on 12/15/19 due to the holidays. Patient informed if she does not hear anything today then she should for sure hear something back by Monday. Patient verbalized understanding. Message sent to clinical pool

## 2019-12-18 NOTE — Telephone Encounter (Signed)
I called pt and discussed her concerns. She states that she has been having the same kind of problems she had several months ago which led to needing emergency surgery due to ovarian torsion and endometriosis. She has been having episodes of sharp pain in LLQ which then causes her to feel lightheaded, dizzy and weak. She is concerned that she may be developing the same problem now on her Lt ovary as she had on the Rt. She has been taking naproxen prn and also continues with Oxycodone as ordered for chronic pain. Pt requests next available appointment with any physician for evaluation. Pt was advised that I will send message to scheduling staff regarding need for appointment and that in the meantime, she should take Naproxen BID. Pt voiced understanding.

## 2019-12-20 ENCOUNTER — Encounter: Payer: Self-pay | Admitting: Obstetrics & Gynecology

## 2019-12-20 ENCOUNTER — Ambulatory Visit (INDEPENDENT_AMBULATORY_CARE_PROVIDER_SITE_OTHER): Payer: Medicaid Other | Admitting: Obstetrics & Gynecology

## 2019-12-20 ENCOUNTER — Other Ambulatory Visit: Payer: Self-pay

## 2019-12-20 VITALS — BP 119/81 | HR 99 | Wt 191.0 lb

## 2019-12-20 DIAGNOSIS — R102 Pelvic and perineal pain: Secondary | ICD-10-CM

## 2019-12-20 DIAGNOSIS — G8929 Other chronic pain: Secondary | ICD-10-CM

## 2019-12-20 DIAGNOSIS — N809 Endometriosis, unspecified: Secondary | ICD-10-CM

## 2019-12-20 MED ORDER — LEUPROLIDE ACETATE (3 MONTH) 11.25 MG IM KIT: 11.2500 mg | PACK | Freq: Once | INTRAMUSCULAR | Status: DC

## 2019-12-20 MED ORDER — NORETHINDRONE ACETATE 5 MG PO TABS: 5.0000 mg | ORAL_TABLET | Freq: Every day | ORAL | 5 refills | Status: DC

## 2019-12-20 NOTE — Progress Notes (Signed)
Patient ID: Kathryn Suarez, female   DOB: October 24, 1986, 34 y.o.   MRN: [id number]  Chief Complaint  Patient presents with  . Pelvic Pain    HPI Kathryn Suarez is a 34 y.o. female.  G0P0 No LMP recorded. (Menstrual status: IUD). Patient with endometriosis reports increasing RLQ pain and pain with orgasm. 4 mo postop from LS LSO in [place]. Mirena is in  Place. HPI  Past Medical History:  Diagnosis Date  . Anemia   . Asthma   . Chronic pelvic pain in female   . Endometriosis   . Hypertension    not currently being treated at this time- weight loss   . Kidney stones   . Rash 12/11/2017   legs, arms, trunk  . Sickle cell trait [facility])     Past Surgical History:  Procedure Laterality Date  . DILATION AND CURETTAGE OF UTERUS  03/23/2007  . EXCISION OF TONGUE LESION    . LAPAROSCOPY N/A 06/01/2018   Procedure: LAPAROSCOPY DIAGNOSTIC, PERITONEAL BIOPSY;  Surgeon: Emily Filbert, MD;  Location: [facility];  Service: Gynecology;  Laterality: N/A;  . LAPAROSCOPY ABDOMEN DIAGNOSTIC N/A     Family History  Problem Relation Age of Onset  . Hypertension Mother   . Sickle cell trait Father     Social History Social History   Tobacco Use  . Smoking status: Never Smoker  . Smokeless tobacco: Never Used  Substance Use Topics  . Alcohol use: No    Comment: occas  . Drug use: No    Allergies  Allergen Reactions  . Latex Hives    Current Outpatient Medications  Medication Sig Dispense Refill  . Elagolix Sodium (ORILISSA) 150 MG TABS Take 1 tablet by mouth daily. 30 tablet 6  . naproxen (NAPROSYN) 500 MG tablet Take 500 mg by mouth 2 (two) times daily with a meal.    . oxyCODONE-acetaminophen (PERCOCET/ROXICET) 5-325 MG tablet Take 3 tablets by mouth every 6 (six) hours as needed for severe pain (gets this at the pain clinic).     Current Facility-Administered Medications  Medication Dose Route Frequency Provider Last Rate Last Admin  . leuprolide (LUPRON) injection  11.25 mg  11.25 mg Intramuscular Once Woodroe Mode, MD        Review of Systems Review of Systems  Constitutional: Negative.   Gastrointestinal: Positive for constipation.  Genitourinary: Positive for dyspareunia, pelvic pain, vaginal bleeding and vaginal discharge.    Blood pressure 119/81, pulse 99, weight 191 lb (86.6 kg).  Physical Exam Physical Exam Constitutional:      Appearance: Normal appearance.  Cardiovascular:     Rate and Rhythm: Normal rate.  Pulmonary:     Effort: Pulmonary effort is normal.  Abdominal:     General: There is no distension.     Tenderness: There is no abdominal tenderness.  Skin:    General: Skin is warm and dry.  Neurological:     Mental Status: She is alert.  Psychiatric:        Mood and Affect: Mood normal.        Behavior: Behavior normal.     Data Reviewed pathology 2019  Assessment Patient Active Problem List   Diagnosis Date Noted  . Post-operative state 09/01/2019  . Endometriosis determined by laparoscopy 06/03/2018  . Morbid obesity ([place]) 11/30/2017  . 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 Menometrorrhagia 08/17/2013  . Sickle cell anemia ([place]) 05/22/2013  . 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 Breast discharge 10/16/2011  .  Chronic pelvic pain in female 10/02/2011     Plan Recommend Lupron for 6-9 months.  Norethindrone 5 mg /day Pelvic US    [name] 12/20/2019, 2:40 PM

## 2019-12-20 NOTE — Patient Instructions (Signed)
Endometriosis  Endometriosis is a condition in which the tissue that lines the uterus (endometrium) grows outside of its normal location. The tissue may grow in many locations close to the uterus, but it commonly grows on the ovaries, fallopian tubes, vagina, or bowel. When the uterus sheds the endometrium every menstrual cycle, there is bleeding wherever the endometrial tissue is located. This can cause pain because blood is irritating to tissues that are not normally exposed to it. What are the causes? The cause of endometriosis is not known. What increases the risk? You may be more likely to develop endometriosis if you:  Have a family history of endometriosis.  Have never given birth.  Started your period at age 10 or younger.  Have high levels of estrogen in your body.  Were exposed to a certain medicine (diethylstilbestrol) before you were born (in utero).  Had low birth weight.  Were born as a twin, triplet, or other multiple.  Have a BMI of less than 25. BMI is an estimate of body fat and is calculated from height and weight. What are the signs or symptoms? Often, there are no symptoms of this condition. If you do have symptoms, they may:  Vary depending on where your endometrial tissue is growing.  Occur during your menstrual period (most common) or midcycle.  Come and go, or you may go months with no symptoms at all.  Stop with menopause. Symptoms may include:  Pain in the back or abdomen.  Heavier bleeding during periods.  Pain during sex.  Painful bowel movements.  Infertility.  Pelvic pain.  Bleeding more than once a month. How is this diagnosed? This condition is diagnosed based on your symptoms and a physical exam. You may have tests, such as:  Blood tests and urine tests. These may be done to help rule out other possible causes of your symptoms.  Ultrasound, to look for abnormal tissues.  An X-ray of the lower bowel (barium enema).  An  ultrasound that is done through the vagina (transvaginally).  CT scan.  MRI.  Laparoscopy. In this procedure, a lighted, pencil-sized instrument called a laparoscope is inserted into your abdomen through an incision. The laparoscope allows your health care provider to look at the organs inside your body and check for abnormal tissue to confirm the diagnosis. If abnormal tissue is found, your health care provider may remove a small piece of tissue (biopsy) to be examined under a microscope. How is this treated? Treatment for this condition may include:  Medicines to relieve pain, such as NSAIDs.  Hormone therapy. This involves using artificial (synthetic) hormones to reduce endometrial tissue growth. Your health care provider may recommend using a hormonal form of birth control, or other medicines.  Surgery. This may be done to remove abnormal endometrial tissue. ? In some cases, tissue may be removed using a laparoscope and a laser (laparoscopic laser treatment). ? In severe cases, surgery may be done to remove the fallopian tubes, uterus, and ovaries (hysterectomy). Follow these instructions at home:  Take over-the-counter and prescription medicines only as told by your health care provider.  Do not drive or use heavy machinery while taking prescription pain medicine.  Try to avoid activities that cause pain, including sexual activity.  Keep all follow-up visits as told by your health care provider. This is important. Contact a health care provider if:  You have pain in the area between your hip bones (pelvic area) that occurs: ? Before, during, or after your period. ?   In between your period and gets worse during your period. ? During or after sex. ? With bowel movements or urination, especially during your period.  You have problems getting pregnant.  You have a fever. Get help right away if:  You have severe pain that does not get better with medicine.  You have severe  nausea and vomiting, or you cannot eat without vomiting.  You have pain that affects only the lower, right side of your abdomen.  You have abdominal pain that gets worse.  You have abdominal swelling.  You have blood in your stool. This information is not intended to replace advice given to you by your health care provider. Make sure you discuss any questions you have with your health care provider. Document Revised: 11/12/2017 Document Reviewed: 05/02/2016 Elsevier Patient Education  2020 Elsevier Inc.  

## 2019-12-27 ENCOUNTER — Ambulatory Visit (HOSPITAL_COMMUNITY)
Admission: RE | Admit: 2019-12-27 | Discharge: 2019-12-27 | Disposition: A | Discharge status: Home / Self Care | Payer: Medicaid Other | Source: Ambulatory Visit | Attending: Obstetrics & Gynecology | Admitting: Obstetrics & Gynecology

## 2019-12-27 ENCOUNTER — Other Ambulatory Visit: Payer: Self-pay

## 2019-12-27 DIAGNOSIS — N809 Endometriosis, unspecified: Secondary | ICD-10-CM | POA: Insufficient documentation

## 2020-01-04 ENCOUNTER — Encounter: Payer: Self-pay | Admitting: General Practice

## 2020-01-04 ENCOUNTER — Telehealth (INDEPENDENT_AMBULATORY_CARE_PROVIDER_SITE_OTHER): Payer: Medicaid Other | Admitting: General Practice

## 2020-01-04 DIAGNOSIS — N83201 Unspecified ovarian cyst, right side: Secondary | ICD-10-CM

## 2020-01-04 MED ORDER — IBUPROFEN 800 MG PO TABS: 800.0000 mg | ORAL_TABLET | Freq: Three times a day (TID) | ORAL | 0 refills | Status: AC | PRN

## 2020-01-04 NOTE — Telephone Encounter (Signed)
Scheduled ultrasound 2/22 @ 1pm. Called patient and informed her of ultrasound results and discussed follow up ultrasound appt. Asked patient about receiving Lupron injection. Patient verbalized understanding & states she is unsure about the Lupron injection and is scared- she is requesting more information. Told patient I would send her a mychart message with a website link for more information. Told her to call back to scheduled injection appt when she is ready. Patient verbalized understanding and asked what she could do for pain relief. Offered ibuprofen Rx and she verbalized understanding. Ibuprofen sent in per protocol. Patient verbalized understanding & had no other questions.

## 2020-01-04 NOTE — Telephone Encounter (Signed)
-----   Message from Woodroe Mode, MD sent at 01/04/2020  8:26 AM EST ----- Right ovarian cyst, f/u US in 6 weeks, lupron as ordered

## 2020-02-05 ENCOUNTER — Telehealth (INDEPENDENT_AMBULATORY_CARE_PROVIDER_SITE_OTHER): Payer: Medicaid Other | Admitting: *Deleted

## 2020-02-05 ENCOUNTER — Ambulatory Visit (HOSPITAL_COMMUNITY): Admission: RE | Admit: 2020-02-05 | Payer: Medicaid Other | Source: Ambulatory Visit

## 2020-02-05 DIAGNOSIS — R102 Pelvic and perineal pain: Secondary | ICD-10-CM

## 2020-02-05 DIAGNOSIS — G8929 Other chronic pain: Secondary | ICD-10-CM

## 2020-02-05 NOTE — Telephone Encounter (Signed)
Pt left VM message requesting a call back from a nurse. She is having problems.

## 2020-02-06 NOTE — Telephone Encounter (Addendum)
Called pt. Pt reporting sharp pain to right and left abdomen that worsens when eating. Pt reports burning in stomach each time she eats or drinks. Pt reports having BM 2-3 x week. Encouraged patient to try OTC medications for acid reflux and constipation as she is not currently taking any medications for these issues. Recommended pt try Colace as directed on packaging until she has a BM and Tums for acid reflux. Explained to pt she should increase her water intake. Pt agrees to contact her PCP regarding these ongoing issues and to follow up with our office if pain worsens.  Pt states she would like to start Depo Lupron injections. [place] office notified to schedule pt for injection appt. Per chart review pt is to have f/u US; [place] appt previously scheduled for 02/05/20. Appt rescheduled for 02/13/20 at 1300 and pt notified to arrive at 1245 with a full bladder.

## 2020-02-09 ENCOUNTER — Ambulatory Visit: Payer: Medicaid Other

## 2020-02-13 ENCOUNTER — Ambulatory Visit (HOSPITAL_COMMUNITY)
Admission: RE | Admit: 2020-02-13 | Discharge: 2020-02-13 | Disposition: A | Discharge status: Home / Self Care | Payer: Medicaid Other | Source: Ambulatory Visit | Attending: Obstetrics & Gynecology | Admitting: Obstetrics & Gynecology

## 2020-02-13 ENCOUNTER — Other Ambulatory Visit: Payer: Self-pay

## 2020-02-13 DIAGNOSIS — N83201 Unspecified ovarian cyst, right side: Secondary | ICD-10-CM | POA: Insufficient documentation

## 2020-02-14 ENCOUNTER — Other Ambulatory Visit: Payer: Self-pay

## 2020-02-14 ENCOUNTER — Encounter: Payer: Self-pay | Admitting: Obstetrics & Gynecology

## 2020-02-14 ENCOUNTER — Telehealth (INDEPENDENT_AMBULATORY_CARE_PROVIDER_SITE_OTHER): Payer: Medicaid Other | Admitting: Obstetrics & Gynecology

## 2020-02-14 DIAGNOSIS — G8929 Other chronic pain: Secondary | ICD-10-CM

## 2020-02-14 DIAGNOSIS — N970 Female infertility associated with anovulation: Secondary | ICD-10-CM

## 2020-02-14 DIAGNOSIS — N809 Endometriosis, unspecified: Secondary | ICD-10-CM

## 2020-02-14 DIAGNOSIS — R102 Pelvic and perineal pain: Secondary | ICD-10-CM | POA: Diagnosis not present

## 2020-02-14 MED ORDER — ORILISSA 150 MG PO TABS: 1.0000 | ORAL_TABLET | Freq: Every day | ORAL | 6 refills | Status: DC

## 2020-02-14 NOTE — Progress Notes (Signed)
I connected with  Kathryn Suarez on 02/14/20 at 1453 by telephone and verified that I am speaking with the correct person using two identifiers.   I discussed the limitations, risks, security and privacy concerns of performing an evaluation and management service by telephone and the availability of in person appointments. I also discussed with the patient that there may be a patient responsible charge related to this service. The patient expressed understanding and agreed to proceed.  Annabell Howells, RN 02/14/2020  2:52 PM

## 2020-02-14 NOTE — Progress Notes (Signed)
TELEHEALTH VIRTUAL GYNECOLOGY VISIT ENCOUNTER NOTE  I connected with Kathryn Suarez on 02/14/20 at  3:15 PM EST by telephone at home and verified that I am speaking with the correct person using two identifiers.   I discussed the limitations, risks, security and privacy concerns of performing an evaluation and management service by telephone and the availability of in person appointments. I also discussed with the patient that there may be a patient responsible charge related to this service. The patient expressed understanding and agreed to proceed.   History:  Kathryn Suarez is a 34 y.o. G0P0 female being evaluated today for pelvic pain, ovarian cyst and endometriosis. She denies any abnormal vaginal discharge, bleeding, pelvic pain or other concerns.       Past Medical History:  Diagnosis Date  . Anemia   . Asthma   . Chronic pelvic pain in female   . Endometriosis   . Hypertension    not currently being treated at this time- weight loss   . Kidney stones   . Rash 12/11/2017   legs, arms, trunk  . Sickle cell trait [facility])    Past Surgical History:  Procedure Laterality Date  . DILATION AND CURETTAGE OF UTERUS  03/23/2007  . EXCISION OF TONGUE LESION    . LAPAROSCOPY N/A 06/01/2018   Procedure: LAPAROSCOPY DIAGNOSTIC, PERITONEAL BIOPSY;  Surgeon: Emily Filbert, MD;  Location: [place];  Service: Gynecology;  Laterality: N/A;  . LAPAROSCOPY ABDOMEN DIAGNOSTIC N/A    The following portions of the patient's history were reviewed and updated as appropriate: allergies, current medications, past family history, past medical history, past social history, past surgical history and problem list.   Health Maintenance:  Normal pap and negative HRHPV on 08/2018.     Review of Systems:  Pertinent items noted in HPI and remainder of comprehensive ROS otherwise negative.  Physical Exam:   General:  Alert, oriented and cooperative.   Mental Status: Normal mood and  affect perceived. Normal judgment and thought content.  Physical exam deferred due to nature of the encounter  Labs and Imaging No results found for this or any previous visit (from the past 336 hour(s)). US Transvaginal Non-OB  Result Date: 02/13/2020 CLINICAL DATA:  RIGHT ovarian cyst EXAM: ULTRASOUND PELVIS TRANSVAGINAL TECHNIQUE: Transvaginal ultrasound examination of the pelvis was performed including evaluation of the uterus, ovaries, adnexal regions, and pelvic cul-de-sac. COMPARISON:  12/27/2019 FINDINGS: Uterus Measurements: 7.6 x 3.4 x 4.6 cm = volume: 62 mL. Anteverted. Normal morphology without mass Endometrium Thickness: 5 mm. IUD identified within expected position at the upper uterine segment endometrial canal. No endometrial fluid. Right ovary Measurements: 5.7 x 3.8 x 4.6 cm = volume: 51.7 mL. Complex hypoechoic nodule with lacy internal septations and low level internal echogenicity identified within RIGHT ovary measuring 5.0 x 3.4 x 4.3 cm. Appearance is most consistent with a hemorrhagic cyst. This has slightly decreased in size since the previous study. No new RIGHT ovarian masses. Left ovary Not visualized, likely obscured by bowel Other findings:  Trace free pelvic fluid.  No adnexal masses. IMPRESSION: IUD in expected position within the upper uterine segment endometrial canal. Probable hemorrhagic cyst of the RIGHT ovary 5.0 cm greatest size, previously 5.6 cm. Electronically Signed   By: Lavonia Dana M.D.   On: 02/13/2020 13:14      Assessment and Plan:     1. Chronic pelvic pain in female Was prescribed this medicine in October but did not fill.  Reordered today - Elagolix Sodium (ORILISSA) 150 MG TABS; Take 1 tablet by mouth daily.  Dispense: 30 tablet; Refill: 6  2. Endometriosis determined by laparoscopy Declines Lupron injections, willing to take oral medication  3. Infertility associated with anovulation Has possible hemorrhagic cyst on [place] that is somewhat reduced in  size       I discussed the assessment and treatment plan with the patient. The patient was provided an opportunity to ask questions and all were answered. The patient agreed with the plan and demonstrated an understanding of the instructions.   The patient was advised to call back or seek an in-person evaluation/go to the ED if the symptoms worsen or if the condition fails to improve as anticipated.  I provided 12 minutes of non-face-to-face time during this encounter.   [name], MD [facility] [place], [place]

## 2020-03-05 ENCOUNTER — Telehealth (INDEPENDENT_AMBULATORY_CARE_PROVIDER_SITE_OTHER): Payer: Medicaid Other | Admitting: Advanced Practice Midwife

## 2020-03-05 DIAGNOSIS — R102 Pelvic and perineal pain: Secondary | ICD-10-CM

## 2020-03-05 DIAGNOSIS — G8929 Other chronic pain: Secondary | ICD-10-CM

## 2020-03-05 NOTE — Telephone Encounter (Signed)
The patient called in stating she has an upcoming appointment with another physician the week of the 23rd. She stated she needs an ultrasound completed as well as the MD appointment before she attends the visit. Informed the patient a message will be sent to the nurses station regarding the request for the ultrasound.

## 2020-03-06 ENCOUNTER — Other Ambulatory Visit (HOSPITAL_COMMUNITY): Payer: Self-pay | Admitting: Lactation Services

## 2020-03-06 ENCOUNTER — Encounter: Payer: Self-pay | Admitting: Lactation Services

## 2020-03-06 DIAGNOSIS — R102 Pelvic and perineal pain: Secondary | ICD-10-CM

## 2020-03-06 DIAGNOSIS — G8929 Other chronic pain: Secondary | ICD-10-CM

## 2020-03-06 NOTE — Telephone Encounter (Signed)
This has been addressed by Dr. Roselie Awkward and patient notified via My Chart. Message to front desk to get patient scheduled for follow up US.

## 2020-03-06 NOTE — Lactation Note (Signed)
Lactation Consultation Note  Patient Name: Kathryn Suarez [id number] Date: 03/06/2020   [place] Ordered per Dr. Roselie Awkward 6 week past one from 3/2.   Debby Freiberg Sumedha Munnerlyn 03/06/2020, 3:48 PM

## 2020-03-06 NOTE — Telephone Encounter (Addendum)
Patient reports that she is planning to see Fertility Specialist. [facility]. She is leaving on the 4/19 to see there fertility specialist. She is to see Dr. Roselie Awkward on 4/9. Patient reports her pain has decreased since starting the [place].  She is requesting an [place] to evaluate the cyst prior to seeing Fertility Specialist. Will route to Dr. Roselie Awkward to inquire about follow up US.

## 2020-03-18 ENCOUNTER — Other Ambulatory Visit: Payer: Self-pay

## 2020-03-18 ENCOUNTER — Ambulatory Visit (HOSPITAL_COMMUNITY)
Admission: RE | Admit: 2020-03-18 | Discharge: 2020-03-18 | Disposition: A | Discharge status: Home / Self Care | Payer: Medicaid Other | Source: Ambulatory Visit | Attending: Obstetrics & Gynecology | Admitting: Obstetrics & Gynecology

## 2020-03-18 DIAGNOSIS — R102 Pelvic and perineal pain: Secondary | ICD-10-CM | POA: Diagnosis present

## 2020-03-18 DIAGNOSIS — G8929 Other chronic pain: Secondary | ICD-10-CM | POA: Insufficient documentation

## 2020-03-22 ENCOUNTER — Encounter: Payer: Self-pay | Admitting: Obstetrics & Gynecology

## 2020-03-22 ENCOUNTER — Ambulatory Visit (INDEPENDENT_AMBULATORY_CARE_PROVIDER_SITE_OTHER): Payer: Medicaid Other | Admitting: Obstetrics & Gynecology

## 2020-03-22 ENCOUNTER — Other Ambulatory Visit: Payer: Self-pay

## 2020-03-22 VITALS — BP 127/88 | HR 89 | Ht 63.0 in | Wt 190.5 lb

## 2020-03-22 DIAGNOSIS — N643 Galactorrhea not associated with childbirth: Secondary | ICD-10-CM | POA: Diagnosis not present

## 2020-03-22 DIAGNOSIS — N809 Endometriosis, unspecified: Secondary | ICD-10-CM

## 2020-03-22 DIAGNOSIS — N6452 Nipple discharge: Secondary | ICD-10-CM

## 2020-03-22 MED ORDER — POLYETHYLENE GLYCOL 3350 17 G PO PACK: 17.0000 g | PACK | Freq: Every day | ORAL | 0 refills | Status: DC

## 2020-03-22 NOTE — Progress Notes (Signed)
Pt states breast are leaking again.

## 2020-03-22 NOTE — Progress Notes (Signed)
Patient ID: Kathryn Suarez, female   DOB: Aug 22, 1986, 34 y.o.   MRN: [id number]  Chief Complaint  Patient presents with  . Follow-up  pelvic US 03/18/20  HPI Kathryn Suarez is a 34 y.o. female.  G0P0 No LMP recorded. (Menstrual status: IUD). She is taking [place] and her pain is improved. An appointment with [facility] is scheduled. IUD in place and not sexually active, reports false positive UPT. HPI  Past Medical History:  Diagnosis Date  . Anemia   . Asthma   . Chronic pelvic pain in female   . Endometriosis   . Hypertension    not currently being treated at this time- weight loss   . Kidney stones   . Rash 12/11/2017   legs, arms, trunk  . Sickle cell trait [facility])     Past Surgical History:  Procedure Laterality Date  . DILATION AND CURETTAGE OF UTERUS  03/23/2007  . EXCISION OF TONGUE LESION    . LAPAROSCOPY N/A 06/01/2018   Procedure: LAPAROSCOPY DIAGNOSTIC, PERITONEAL BIOPSY;  Surgeon: Emily Filbert, MD;  Location: [place];  Service: Gynecology;  Laterality: N/A;  . LAPAROSCOPY ABDOMEN DIAGNOSTIC N/A     Family History  Problem Relation Age of Onset  . Hypertension Mother   . Sickle cell trait Father     Social History Social History   Tobacco Use  . Smoking status: Never Smoker  . Smokeless tobacco: Never Used  Substance Use Topics  . Alcohol use: No    Comment: occas  . Drug use: No    Allergies  Allergen Reactions  . Latex Hives    Current Outpatient Medications  Medication Sig Dispense Refill  . Elagolix Sodium (ORILISSA) 150 MG TABS Take 1 tablet by mouth daily. 30 tablet 6  . ibuprofen (ADVIL) 800 MG tablet Take 1 tablet (800 mg total) by mouth every 8 (eight) hours as needed. 30 tablet 0  . naproxen (NAPROSYN) 500 MG tablet Take 500 mg by mouth 2 (two) times daily with a meal.    . norethindrone (AYGESTIN) 5 MG tablet Take 1 tablet (5 mg total) by mouth daily. 30 tablet 5  . oxyCODONE-acetaminophen  (PERCOCET/ROXICET) 5-325 MG tablet Take 3 tablets by mouth every 6 (six) hours as needed for severe pain (gets this at the pain clinic).    . polyethylene glycol (MIRALAX) 17 g packet Take 17 g by mouth daily. 14 each 0   Current Facility-Administered Medications  Medication Dose Route Frequency Provider Last Rate Last Admin  . leuprolide (LUPRON) injection 11.25 mg  11.25 mg Intramuscular Once Woodroe Mode, MD        Review of Systems Review of Systems  Gastrointestinal: Positive for abdominal distention and constipation (chronic).  Genitourinary: Positive for pelvic pain. Negative for menstrual problem and vaginal bleeding.       Bilateral nipple discharge    Blood pressure 127/88, pulse 89, height 5\' 3"  (1.6 m), weight 190 lb 8 oz (86.4 kg).  Physical Exam Physical Exam Constitutional:      Appearance: Normal appearance. She is not ill-appearing.  Pulmonary:     Effort: Pulmonary effort is normal.  Abdominal:     General: Abdomen is flat.     Palpations: Abdomen is soft.  Neurological:     Mental Status: She is alert.  Psychiatric:        Mood and Affect: Mood normal.        Behavior: Behavior normal.   Breasts: breasts  appear normal, no suspicious masses, no skin or nipple changes or axillary nodes.   Data Reviewed CLINICAL DATA:  Chronic pelvic pain follow-up right ovary mass  EXAM: ULTRASOUND PELVIS TRANSVAGINAL  TECHNIQUE: Transvaginal ultrasound examination of the pelvis was performed including evaluation of the uterus, ovaries, adnexal regions, and pelvic cul-de-sac.  COMPARISON:  02/13/2020  FINDINGS: Uterus  Measurements: 7.7 by 3.4 x 5.0 cm. = volume: 65.8 mL. No fibroids or other mass visualized.  Endometrium  Thickness: 3.4 mm.  IUD present  Right ovary  Measurements: 4.8 x 2.3 x 2.8 cm. = volume: 15.9 mL. Previously 5.7 x 3.8 x 4.6 cm with a volume of 51.7 cc. Interval resolution of previous hemorrhagic cyst.  Left  ovary  Measurements: Not visualized.  Other findings:  No abnormal free fluid  IMPRESSION: 1. Resolution of previous right ovary hemorrhagic cyst. 2. IUD present.   Electronically Signed   By: Kerby Moors M.D.   On: 03/18/2020 10:58  Assessment Galactorrhea - Plan: Prolactin  Breast discharge  Endometriosis determined by laparoscopy - Plan: polyethylene glycol (MIRALAX) 17 g packet  Constipation  Plan F/u PRlL due to h/o galactorrhea Responding to [facility] for endometriosis Miralax for constipation Keep appt with Infertility    [name] 03/22/2020, 9:46 AM

## 2020-03-23 LAB — PROLACTIN: Prolactin: 17 ng/mL (ref 4.8–23.3)

## 2020-04-04 ENCOUNTER — Ambulatory Visit: Payer: Medicaid Other | Admitting: Obstetrics & Gynecology

## 2020-06-25 ENCOUNTER — Encounter (HOSPITAL_COMMUNITY): Payer: Self-pay

## 2020-06-25 ENCOUNTER — Other Ambulatory Visit: Payer: Self-pay

## 2020-06-25 ENCOUNTER — Ambulatory Visit (HOSPITAL_COMMUNITY)
Admission: EM | Admit: 2020-06-25 | Discharge: 2020-06-25 | Disposition: A | Discharge status: Home / Self Care | Payer: Medicaid Other | Attending: Family Medicine | Admitting: Family Medicine

## 2020-06-25 DIAGNOSIS — R102 Pelvic and perineal pain: Secondary | ICD-10-CM

## 2020-06-25 MED ORDER — ONDANSETRON 4 MG PO TBDP
ORAL_TABLET | ORAL | Status: AC
  Filled 2020-06-25: qty 1

## 2020-06-25 MED ORDER — ONDANSETRON HCL 4 MG/2ML IJ SOLN
INTRAMUSCULAR | Status: AC
  Filled 2020-06-25: qty 2

## 2020-06-25 MED ORDER — KETOROLAC TROMETHAMINE 30 MG/ML IJ SOLN
30.0000 mg | Freq: Once | INTRAMUSCULAR | Status: AC
  Administered 2020-06-25: 30 mg via INTRAMUSCULAR

## 2020-06-25 MED ORDER — KETOROLAC TROMETHAMINE 30 MG/ML IJ SOLN
INTRAMUSCULAR | Status: AC
  Filled 2020-06-25: qty 1

## 2020-06-25 MED ORDER — ONDANSETRON 4 MG PO TBDP
4.0000 mg | ORAL_TABLET | Freq: Once | ORAL | Status: AC
  Administered 2020-06-25: 4 mg via ORAL

## 2020-06-25 NOTE — Discharge Instructions (Addendum)
Please go to the ER for further evaluation and rule out ovarian torsion. Toradol given here for pain along with Zofran for nausea

## 2020-06-25 NOTE — ED Triage Notes (Signed)
Pt presents to UC for bilateral lower abdominal pain. Pt states she has had ovarian cyst in past, and presentation today feels same. Pt states pain has been present x2 days. Pt endorsing difficult and painful urination. Pt denies discharge. Pt endorses loss of appetite/nausea/fatigue r/t pain. Pt denies OTC treatment or relieving factors.

## 2020-06-25 NOTE — ED Provider Notes (Signed)
[facility]    CSN: [id number] Arrival date & time: 06/25/20  5361      History   Chief Complaint Chief Complaint  Patient presents with  . Abdominal Pain    HPI Kathryn Suarez is a 34 y.o. female.   Pt is a 34 year old female with significant medical history of chronic pelvic pain, endometriosis, and ovarian torsion. Reports lower abdominal/pelvic pain, back pain, painful to urinate due to pain in pelvic area. Unable to tolerate PO intake due to pain. Denies nausea/vomiting/diarrhea, burning with urination. Ibuprofen taken with minimal relief. Reports she usually has chronic cramping but states these symptoms are significantly worse and similar to the symptoms she had with previous ovarian torsion.   ROS per HPI      Past Medical History:  Diagnosis Date  . Anemia   . Asthma   . Chronic pelvic pain in female   . Endometriosis   . Hypertension    not currently being treated at this time- weight loss   . Kidney stones   . Rash 12/11/2017   legs, arms, trunk  . Sickle cell trait [facility])     Patient Active Problem List   Diagnosis Date Noted  . Post-operative state 09/01/2019  . Endometriosis determined by laparoscopy 06/03/2018  . Morbid obesity ([place]) 11/30/2017  . 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 Menometrorrhagia 08/17/2013  . Sickle cell anemia ([place]) 05/22/2013  . 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 Breast discharge 10/16/2011  . Chronic pelvic pain in female 10/02/2011    Past Surgical History:  Procedure Laterality Date  . DILATION AND CURETTAGE OF UTERUS  03/23/2007  . EXCISION OF TONGUE LESION    . LAPAROSCOPY N/A 06/01/2018   Procedure: LAPAROSCOPY DIAGNOSTIC, PERITONEAL BIOPSY;  Surgeon: Emily Filbert, MD;  Location: [place];  Service: Gynecology;  Laterality: N/A;  . LAPAROSCOPY ABDOMEN DIAGNOSTIC N/A     OB History    Gravida  0   Para      Term      Preterm      AB      Living        SAB        TAB      Ectopic      Multiple      Live Births               Home Medications    Prior to Admission medications   Medication Sig Start Date End Date Taking? Authorizing Provider  Elagolix Sodium (ORILISSA) 150 MG TABS Take 1 tablet by mouth daily. 02/14/20   Woodroe Mode, MD  ibuprofen (ADVIL) 800 MG tablet Take 1 tablet (800 mg total) by mouth every 8 (eight) hours as needed. 01/04/20   Woodroe Mode, MD  naproxen (NAPROSYN) 500 MG tablet Take 500 mg by mouth 2 (two) times daily with a meal.    [provider]  norethindrone (AYGESTIN) 5 MG tablet Take 1 tablet (5 mg total) by mouth daily. 12/20/19   Woodroe Mode, MD  oxyCODONE-acetaminophen (PERCOCET/ROXICET) 5-325 MG tablet Take 3 tablets by mouth every 6 (six) hours as needed for severe pain (gets this at the pain clinic).    [provider]  polyethylene glycol (MIRALAX) 17 g packet Take 17 g by mouth daily. 03/22/20   Woodroe Mode, MD    Family History Family History  Problem Relation Age of Onset  . Hypertension Mother   . Sickle cell trait  Father     Social History Social History   Tobacco Use  . Smoking status: Never Smoker  . Smokeless tobacco: Never Used  Vaping Use  . Vaping Use: Never used  Substance Use Topics  . Alcohol use: No    Comment: occas  . Drug use: No     Allergies   Latex   Review of Systems Review of Systems  Constitutional: Negative.   Gastrointestinal: Positive for abdominal pain. Negative for diarrhea, nausea and vomiting.  Genitourinary: Positive for difficulty urinating and pelvic pain. Negative for vaginal pain.       Difficulty urinating due to pelvic pain  Neurological: Positive for light-headedness.     Physical Exam Triage Vital Signs ED Triage Vitals [06/25/20 0940]  Enc Vitals Group     BP 125/72     Pulse Rate 89     Resp 16     Temp 98.9 F (37.2 C)     Temp Source Oral     SpO2 100 %     Weight      Height      Head  Circumference      Peak Flow      Pain Score 10     Pain Loc      Pain Edu?      Excl. in [place]?    No data found.  Updated Vital Signs BP 125/72 (BP Location: Right Arm)   Pulse 89   Temp 98.9 F (37.2 C) (Oral)   Resp 16   LMP  (LMP Unknown)   SpO2 100%   Visual Acuity Right Eye Distance:   Left Eye Distance:   Bilateral Distance:    Right Eye Near:   Left Eye Near:    Bilateral Near:     Physical Exam Constitutional:      Appearance: She is obese. She is ill-appearing.     Comments: Tearful, in pain  Pulmonary:     Effort: Pulmonary effort is normal.  Abdominal:     General: Abdomen is flat. A surgical scar is present.     Palpations: Abdomen is soft.     Tenderness: There is abdominal tenderness in the suprapubic area.  Skin:    General: Skin is warm and dry.  Neurological:     Mental Status: She is alert.      UC Treatments / Results  Labs (all labs ordered are listed, but only abnormal results are displayed) Labs Reviewed - No data to display  EKG   Radiology No results found.  Procedures Procedures (including critical care time)  Medications Ordered in UC Medications  ketorolac (TORADOL) 30 MG/ML injection 30 mg (30 mg Intramuscular Given 06/25/20 1018)  ondansetron (ZOFRAN-ODT) disintegrating tablet 4 mg (4 mg Oral Given 06/25/20 1018)    Initial Impression / Assessment and Plan / UC Course  I have reviewed the triage vital signs and the nursing notes.  Pertinent labs & imaging results that were available during my care of the patient were reviewed by me and considered in my medical decision making (see chart for details).     Pelvic pain with history of ovarian torsion.  Sending to the ER for further evaluation. Toradol given here for pain and Zofran prior to discharge Final Clinical Impressions(s) / UC Diagnoses   Final diagnoses:  Pelvic pain in female     Discharge Instructions     Please go to the ER for further evaluation and  rule out ovarian torsion. Toradol  given here for pain along with Zofran for nausea    ED Prescriptions    None     PDMP not reviewed this encounter.   Loura Halt A, NP 06/25/20 1025

## 2020-11-12 ENCOUNTER — Telehealth: Payer: Self-pay | Admitting: Family Medicine

## 2020-11-12 NOTE — Telephone Encounter (Signed)
Pt request a call back about severe abd pain and needs to know if she needs to come into office. Please call pt. Thanks

## 2020-11-13 NOTE — Telephone Encounter (Signed)
Returned patients call.   She reports she is continuing to have pain. She feels dizzy and weak when in pain. She is taking pain meds and is resting.   The pain is occurring every 3-4 hours and is bleeding a lot. This started a few days ago. The pain is in the uterine area. It is painful with urination and bowel movements.   She reports she is having spotting for 3 weeks at a time. She is having the heavy bleeding for about 3 days out of the month. Sometimes is is happening twice a month.  She stopped taking Orilissa due to daily headaches, when she stopped the headaches went away. Her cycles were better on the Orilissa with less pain and less bleeding.   She is taking Percocet for pain. She takes if 4 times a day and it helps but wears off she feels dizzy and light headed.    She reports she is having pain with bowel movements, she reports she tends to hold it as it hurts. She has not taken stool softeners or Miralax. She uses Castor Oil and bananas weekly to have a bowel movement.   She reports her perineum is also very painful to touch as she has had in the past.   Recommended patient come in for office visit to address concerns. Message to front desk to call and schedule.   Reviewed with patient it may take a while to get in with provider, she asked for first available with female provider.

## 2020-11-29 ENCOUNTER — Other Ambulatory Visit: Payer: Self-pay | Admitting: Obstetrics & Gynecology

## 2020-11-29 ENCOUNTER — Other Ambulatory Visit: Payer: Self-pay

## 2020-11-29 ENCOUNTER — Encounter: Payer: Self-pay | Admitting: Obstetrics & Gynecology

## 2020-11-29 ENCOUNTER — Ambulatory Visit (INDEPENDENT_AMBULATORY_CARE_PROVIDER_SITE_OTHER): Payer: Medicaid Other | Admitting: Obstetrics & Gynecology

## 2020-11-29 VITALS — BP 112/62 | HR 88 | Ht 63.0 in | Wt 197.0 lb

## 2020-11-29 DIAGNOSIS — N803 Endometriosis of pelvic peritoneum: Secondary | ICD-10-CM

## 2020-11-29 DIAGNOSIS — M6289 Other specified disorders of muscle: Secondary | ICD-10-CM | POA: Diagnosis not present

## 2020-11-29 DIAGNOSIS — R102 Pelvic and perineal pain: Secondary | ICD-10-CM | POA: Diagnosis not present

## 2020-11-29 DIAGNOSIS — N83519 Torsion of ovary and ovarian pedicle, unspecified side: Secondary | ICD-10-CM

## 2020-11-29 DIAGNOSIS — G8929 Other chronic pain: Secondary | ICD-10-CM | POA: Diagnosis not present

## 2020-11-29 DIAGNOSIS — IMO0002 Reserved for concepts with insufficient information to code with codable children: Secondary | ICD-10-CM

## 2020-11-29 NOTE — Patient Instructions (Signed)
Governor Specking, MD [facility]
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
## 2020-12-01 ENCOUNTER — Encounter: Payer: Self-pay | Admitting: Obstetrics & Gynecology

## 2020-12-01 DIAGNOSIS — D573 Sickle-cell trait: Secondary | ICD-10-CM | POA: Insufficient documentation

## 2020-12-01 DIAGNOSIS — N809 Endometriosis, unspecified: Secondary | ICD-10-CM | POA: Insufficient documentation

## 2020-12-04 ENCOUNTER — Other Ambulatory Visit: Payer: Self-pay | Admitting: General Practice

## 2020-12-04 DIAGNOSIS — IMO0002 Reserved for concepts with insufficient information to code with codable children: Secondary | ICD-10-CM

## 2020-12-04 DIAGNOSIS — G8929 Other chronic pain: Secondary | ICD-10-CM

## 2020-12-04 MED ORDER — ORILISSA 150 MG PO TABS: 150.0000 mg | ORAL_TABLET | Freq: Every day | ORAL | 5 refills | Status: DC

## 2021-01-21 ENCOUNTER — Telehealth: Payer: Self-pay | Admitting: Family Medicine

## 2021-01-21 NOTE — Telephone Encounter (Signed)
Pt states that her breast have been hurting for several weeks on an off, but today she is having sharp severe pains in her breast and discharge from both  Nipples.  Pt states left breast hurt and discharge more. Pt request a call ASAP

## 2021-01-21 NOTE — Telephone Encounter (Signed)
Called pt and pt informed me that she has nipple discharge that she has been putting a panty liner inside her bra because it is a lot.  Pt also reports that she has pain in breast that is intermittent over the last few weeks however increased in pain over the past couple of days.  Pt confirmed that she had the same thing a couple of years ago but has not had it since.  I informed pt that we are able to get her in tomorrow with Dr. Ilda Basset for a breast exam at 1435.  Pt stated that she would be able to come to appt.  Pt did not have any other concerns at this time.    Mel Almond, RN  01/21/21

## 2021-01-22 ENCOUNTER — Other Ambulatory Visit: Payer: Self-pay

## 2021-01-22 ENCOUNTER — Ambulatory Visit (INDEPENDENT_AMBULATORY_CARE_PROVIDER_SITE_OTHER): Payer: Medicaid Other | Admitting: Obstetrics and Gynecology

## 2021-01-22 ENCOUNTER — Encounter: Payer: Self-pay | Admitting: Obstetrics and Gynecology

## 2021-01-22 VITALS — BP 132/96 | HR 76 | Ht 63.0 in | Wt 204.0 lb

## 2021-01-22 DIAGNOSIS — N6452 Nipple discharge: Secondary | ICD-10-CM

## 2021-01-22 NOTE — Progress Notes (Signed)
Obstetrics and Gynecology Visit Return Patient Evaluation  Appointment Date: 01/22/2021  Primary Care Provider: [place] [facility]  Chief Complaint: b/l worsened breast pain and nipple d/c  History of Present Illness:  Kathryn Suarez is a 35 y.o. with years long h/o of b/l breast pain. She states she's had a work up in the past but it was negative.  Pain is left > right and started about 1-2 months ago and feels as if it is getting worse; same for nipple d/c  Breast pain: right feels sore diffusely. No lumps, bumps or skin changes. Left feels more painful and feels sharp particularly in the 2 o'clock area of the breast.   Nipple d/c: has also had this for years but seems to be easily to be expressed. On the right it still looks cloudy. On the left, it still looks cloudy but also milky at times which is new.  D/c on either side has never looked bloody, brown/black, green.   Patient is amenorrheic with IUD. She states she took the elagolix for a month but stopped it b/c of HA and made her feel odd; ?maybe breast s/s started after being on the meds.   Review of Systems: as noted in the History of Present Illness.   Patient Active Problem List   Diagnosis Date Noted  . Sickle cell trait ([place]) 12/01/2020  . Endometriosis of the cul-de-sac   . Ovarian torsion 2020  . Morbid obesity ([place]) 11/30/2017  . 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 Chronic pelvic pain in female 10/02/2011   Medications:  Melessa O. Glanz had no medications administered during this visit. Current Outpatient Medications  Medication Sig Dispense Refill  . Elagolix Sodium (ORILISSA) 150 MG TABS Take 150 mg by mouth daily. 30 tablet 5  . ELDERBERRY PO Take by mouth.    Marland Kitchen ibuprofen (ADVIL) 800 MG tablet Take 1 tablet (800 mg total) by mouth every 8 (eight) hours as needed. 30 tablet 0  . Melatonin 1 MG CAPS  Take by mouth.    . oxyCODONE-acetaminophen (PERCOCET/ROXICET) 5-325 MG tablet Take 3 tablets by mouth every 6 (six) hours as needed for severe pain (gets this at the pain clinic).    . polyethylene glycol (MIRALAX) 17 g packet Take 17 g by mouth daily. 14 each 0   No current facility-administered medications for this visit.    Allergies: is allergic to latex.  Physical Exam:  BP (!) 132/96   Pulse 76   Ht 5\' 3"  (1.6 m)   Wt 204 lb (92.5 kg)   BMI 36.14 kg/m  Body mass index is 36.14 kg/m. General appearance: Well nourished, well developed female in no acute distress.  Breast: Right: normal palpation except sore mildly diffusely. Normal inspection. Pt able to express cloudy nipple d/c and appears to be from 2-4 discrete points Left: moderately ttp particularly in the 2 o'clock area about 3-4cm from the nipple, ?fibrocystic changes. Normal inspection. Patient able to express cloudy d/c from 1-2 ducts and milky d/c from 1-2 ducts No axillary LAD b/l Neuro/Psych:  Normal mood and affect.    Assessment: pt stable  Plan:  1. Bilateral nipple discharge - Beta hCG quant (ref lab) - TSH - Prolactin - MM Digital Diagnostic Bilat; Future - US BREAST COMPLETE UNI LEFT INC AXILLA; Future - US BREAST COMPLETE UNI RIGHT INC AXILLA; Future   RTC: PRN  Durene Romans MD  Attending Center for [employer] [facility] farm manager)

## 2021-01-23 ENCOUNTER — Telehealth: Payer: Self-pay

## 2021-01-23 LAB — PROLACTIN: Prolactin: 33.6 ng/mL — ABNORMAL HIGH (ref 4.8–23.3)

## 2021-01-23 LAB — BETA HCG QUANT (REF LAB): hCG Quant: <1 m[IU]/mL

## 2021-01-23 LAB — TSH: TSH: 2.34 u[IU]/mL (ref 0.450–4.500)

## 2021-01-23 NOTE — Telephone Encounter (Signed)
Called Pt to advise of Breast US appointment on 03/06/21 @ 9am, they stated for Pt to call before then to see if anything is available has opened up before 03/06/21. Gave Pt their contact #, Pt also concerned of lab results received thru My Chart. Explained everything normal except for Prolactin level a litlle elevated, nothing to worry about and that Dr. Ilda Basset will go into more details once he has reviewed. Pt verbalized understanding.

## 2021-01-28 ENCOUNTER — Telehealth: Payer: Self-pay | Admitting: Obstetrics and Gynecology

## 2021-01-28 DIAGNOSIS — N643 Galactorrhea not associated with childbirth: Secondary | ICD-10-CM

## 2021-01-28 NOTE — Telephone Encounter (Signed)
GYN Telephone Note Patient called about slightly elevated PRL at 33.6 (ULN 23.5) but I told her it wasn't a fasting blood draw so I recommend repeating it as a fasting. Pt is amenable to plan.  Office to contact patient for appt.  Patient has breast imaging already set up for march   Durene Romans MD [facility]ending [facility] for [employer] (Faculty Practice) 01/28/2021 Time: [phone]

## 2021-02-04 ENCOUNTER — Other Ambulatory Visit: Payer: Self-pay

## 2021-02-04 ENCOUNTER — Other Ambulatory Visit: Payer: Medicaid Other

## 2021-02-04 DIAGNOSIS — R5383 Other fatigue: Secondary | ICD-10-CM

## 2021-02-04 DIAGNOSIS — N643 Galactorrhea not associated with childbirth: Secondary | ICD-10-CM

## 2021-02-05 LAB — PROLACTIN: Prolactin: 16.1 ng/mL (ref 4.8–23.3)

## 2021-02-06 LAB — COMPREHENSIVE METABOLIC PANEL
ALT: 11 IU/L (ref 0–32)
AST: 16 IU/L (ref 0–40)
Albumin/Globulin Ratio: 1.4 (ref 1.2–2.2)
Albumin: 4.1 g/dL (ref 3.8–4.8)
Alkaline Phosphatase: 82 IU/L (ref 44–121)
BUN/Creatinine Ratio: 10 (ref 9–23)
BUN: 9 mg/dL (ref 6–20)
Bilirubin Total: 0.5 mg/dL (ref 0.0–1.2)
CO2: 25 mmol/L (ref 20–29)
Calcium: 9.3 mg/dL (ref 8.7–10.2)
Chloride: 98 mmol/L (ref 96–106)
Creatinine, Ser: 0.88 mg/dL (ref 0.57–1.00)
GFR calc Af Amer: 100 mL/min/{1.73_m2} (ref >59)
GFR calc non Af Amer: 87 mL/min/{1.73_m2} (ref >59)
Globulin, Total: 3 g/dL (ref 1.5–4.5)
Glucose: 85 mg/dL (ref 65–99)
Potassium: 4.2 mmol/L (ref 3.5–5.2)
Sodium: 141 mmol/L (ref 134–144)
Total Protein: 7.1 g/dL (ref 6.0–8.5)

## 2021-02-06 LAB — T3, FREE: T3, Free: 2.7 pg/mL (ref 2.0–4.4)

## 2021-02-06 LAB — CBC WITH DIFFERENTIAL/PLATELET
Basophils Absolute: 0 10*3/uL (ref 0.0–0.2)
Basos: 0 %
EOS (ABSOLUTE): 0.2 10*3/uL (ref 0.0–0.4)
Eos: 4 %
Hematocrit: 38.6 % (ref 34.0–46.6)
Hemoglobin: 12.6 g/dL (ref 11.1–15.9)
Immature Grans (Abs): 0 10*3/uL (ref 0.0–0.1)
Immature Granulocytes: 0 %
Lymphocytes Absolute: 1.4 10*3/uL (ref 0.7–3.1)
Lymphs: 24 %
MCH: 27.5 pg (ref 26.6–33.0)
MCHC: 32.6 g/dL (ref 31.5–35.7)
MCV: 84 fL (ref 79–97)
Monocytes Absolute: 0.3 10*3/uL (ref 0.1–0.9)
Monocytes: 6 %
Neutrophils Absolute: 3.7 10*3/uL (ref 1.4–7.0)
Neutrophils: 66 %
Platelets: 265 10*3/uL (ref 150–450)
RBC: 4.58 x10E6/uL (ref 3.77–5.28)
RDW: 13 % (ref 11.7–15.4)
WBC: 5.6 10*3/uL (ref 3.4–10.8)

## 2021-02-06 LAB — ESTRADIOL: Estradiol: 152 pg/mL

## 2021-02-06 LAB — SEX HORMONE BINDING GLOBULIN: Sex Hormone Binding: 57.1 nmol/L (ref 24.6–122.0)

## 2021-02-06 LAB — HGB A1C W/O EAG: Hgb A1c MFr Bld: 5.2 % (ref 4.8–5.6)

## 2021-02-06 LAB — FOLLICLE STIMULATING HORMONE: FSH: 4.4 m[IU]/mL

## 2021-02-06 LAB — VITAMIN B12: Vitamin B-12: 855 pg/mL (ref 232–1245)

## 2021-02-06 LAB — TSH: TSH: 1.69 u[IU]/mL (ref 0.450–4.500)

## 2021-02-06 LAB — THYROID PEROXIDASE ANTIBODY: Thyroperoxidase Ab SerPl-aCnc: <8 IU/mL (ref 0–34)

## 2021-02-06 LAB — IODINE, SERUM/PLASMA: Iodine: 42.2 ug/L (ref 40.0–92.0)

## 2021-02-06 LAB — HIGH SENSITIVITY CRP: CRP, High Sensitivity: 8.27 mg/L — ABNORMAL HIGH (ref 0.00–3.00)

## 2021-02-06 LAB — VITAMIN D 25 HYDROXY (VIT D DEFICIENCY, FRACTURES): Vit D, 25-Hydroxy: 23.1 ng/mL — ABNORMAL LOW (ref 30.0–100.0)

## 2021-02-06 LAB — TESTOSTERONE,FREE AND TOTAL
Testosterone, Free: 2.2 pg/mL (ref 0.0–4.2)
Testosterone: 27 ng/dL (ref 8–60)

## 2021-02-06 LAB — DHEA-SULFATE: DHEA-SO4: 435 ug/dL — ABNORMAL HIGH (ref 84.8–378.0)

## 2021-02-18 ENCOUNTER — Encounter: Payer: Medicaid Other | Attending: Obstetrics & Gynecology | Admitting: Physical Therapy

## 2021-02-25 ENCOUNTER — Encounter: Payer: Medicaid Other | Admitting: Physical Therapy

## 2021-03-04 ENCOUNTER — Encounter: Payer: Medicaid Other | Admitting: Physical Therapy

## 2021-03-06 ENCOUNTER — Ambulatory Visit: Admission: RE | Admit: 2021-03-06 | Payer: Medicaid Other | Source: Ambulatory Visit

## 2021-03-06 ENCOUNTER — Other Ambulatory Visit: Payer: Self-pay

## 2021-03-06 ENCOUNTER — Ambulatory Visit
Admission: RE | Admit: 2021-03-06 | Discharge: 2021-03-06 | Disposition: A | Discharge status: Home / Self Care | Payer: Medicaid Other | Source: Ambulatory Visit | Attending: Obstetrics and Gynecology | Admitting: Obstetrics and Gynecology

## 2021-03-06 DIAGNOSIS — N6452 Nipple discharge: Secondary | ICD-10-CM

## 2021-03-11 ENCOUNTER — Encounter: Payer: Medicaid Other | Admitting: Physical Therapy

## 2021-04-12 ENCOUNTER — Telehealth: Payer: Medicaid Other | Admitting: Nurse Practitioner

## 2021-04-12 DIAGNOSIS — R399 Unspecified symptoms and signs involving the genitourinary system: Secondary | ICD-10-CM | POA: Diagnosis not present

## 2021-04-13 MED ORDER — NITROFURANTOIN MONOHYD MACRO 100 MG PO CAPS: 100.0000 mg | ORAL_CAPSULE | Freq: Two times a day (BID) | ORAL | 0 refills | Status: AC

## 2021-04-13 NOTE — Progress Notes (Signed)
We are sorry that you are not feeling well.  Here is how we plan to help!  Based on what you shared with me it looks like you most likely have a simple urinary tract infection.  A UTI (Urinary Tract Infection) is a bacterial infection of the bladder.  Most cases of urinary tract infections are simple to treat but a key part of your care is to encourage you to drink plenty of fluids and watch your symptoms carefully.  I have prescribed MacroBid 100 mg twice a day for 5 days.  Your symptoms should gradually improve. Call us if the burning in your urine worsens, you develop worsening fever, back pain or pelvic pain or if your symptoms do not resolve after completing the antibiotic.  Urinary tract infections can be prevented by drinking plenty of water to keep your body hydrated.  Also be sure when you wipe, wipe from front to back and don't hold it in!  If possible, empty your bladder every 4 hours.  Your e-visit answers were reviewed by a board certified advanced clinical practitioner to complete your personal care plan.  Depending on the condition, your plan could have included both over the counter or prescription medications.  If there is a problem please reply  once you have received a response from your provider.  Your safety is important to us.  If you have drug allergies check your prescription carefully.    You can use MyChart to ask questions about today's visit, request a non-urgent call back, or ask for a work or school excuse for 24 hours related to this e-Visit. If it has been greater than 24 hours you will need to follow up with your provider, or enter a new e-Visit to address those concerns.   You will get an e-mail in the next two days asking about your experience.  I hope that your e-visit has been valuable and will speed your recovery. Thank you for using e-visits.  I have spent at least 5 minutes reviewing and documenting in the patient's chart.  

## 2021-05-03 IMAGING — MG DIGITAL DIAGNOSTIC BILAT W/ TOMO W/ CAD
6 of 10 series · 6 of 30 positions shown · non-contrast
Comparison: None.

CLINICAL DATA: Bilateral nipple discharge. The nipple discharge on
the left is spontaneous from a single orifice and is milky in color.
The nipple discharge right arises from 3 orifices and is only when
expressed for the past 3 years. The discharge is clear. She has an
intrauterine device. No family history of breast cancer. Possible
mass felt on recent physical examination in the 2 o'clock position
of the left breast, 3-4 cm from the nipple. The patient cannot feel
a mass today.

EXAM:
DIGITAL DIAGNOSTIC BILATERAL MAMMOGRAM WITH TOMOSYNTHESIS AND CAD;
ULTRASOUND LEFT BREAST LIMITED
TECHNIQUE: Bilateral digital diagnostic mammography and breast tomosynthesis
was performed. The images were evaluated with computer-aided
detection.; Targeted ultrasound examination of the left breast was
performed

[R MLO synth-2D]
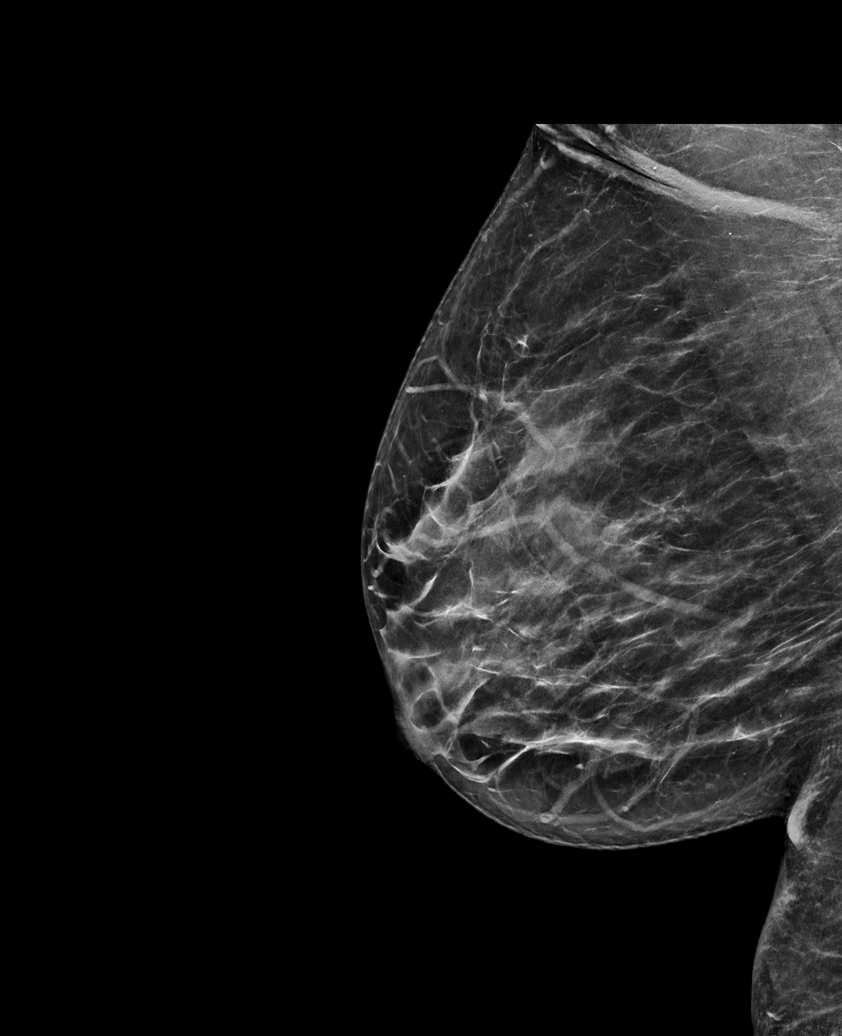

[L MLO synth-2D (1 of 2)]
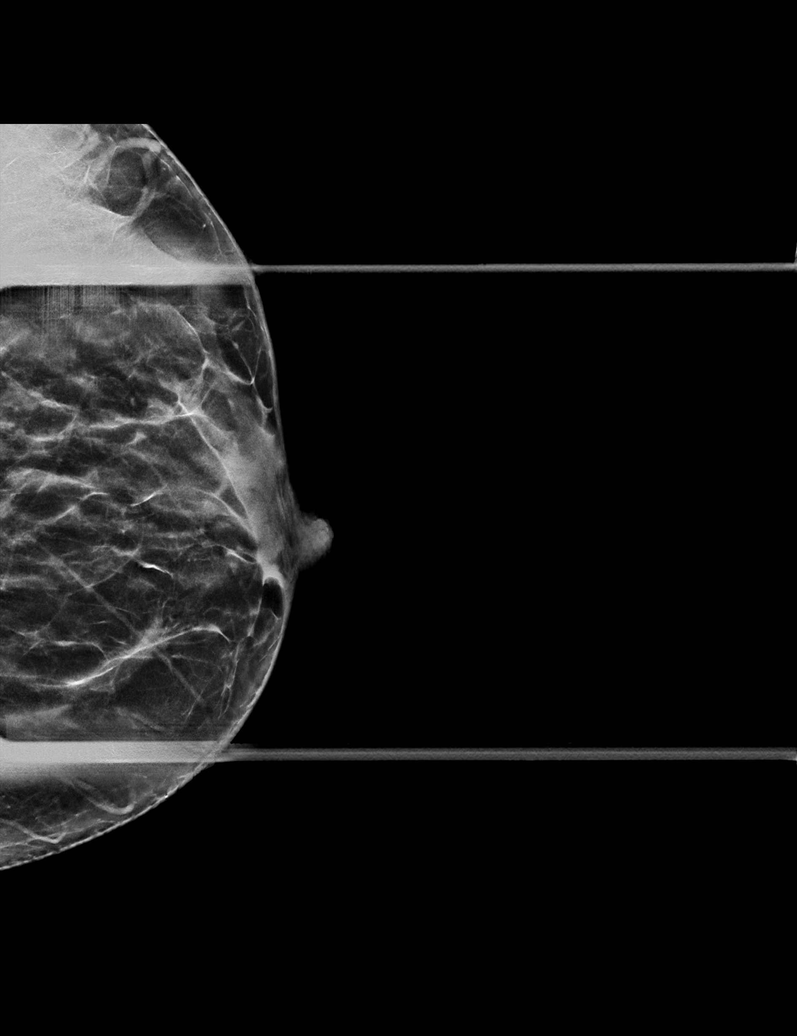

[L MLO synth-2D (2 of 2)]
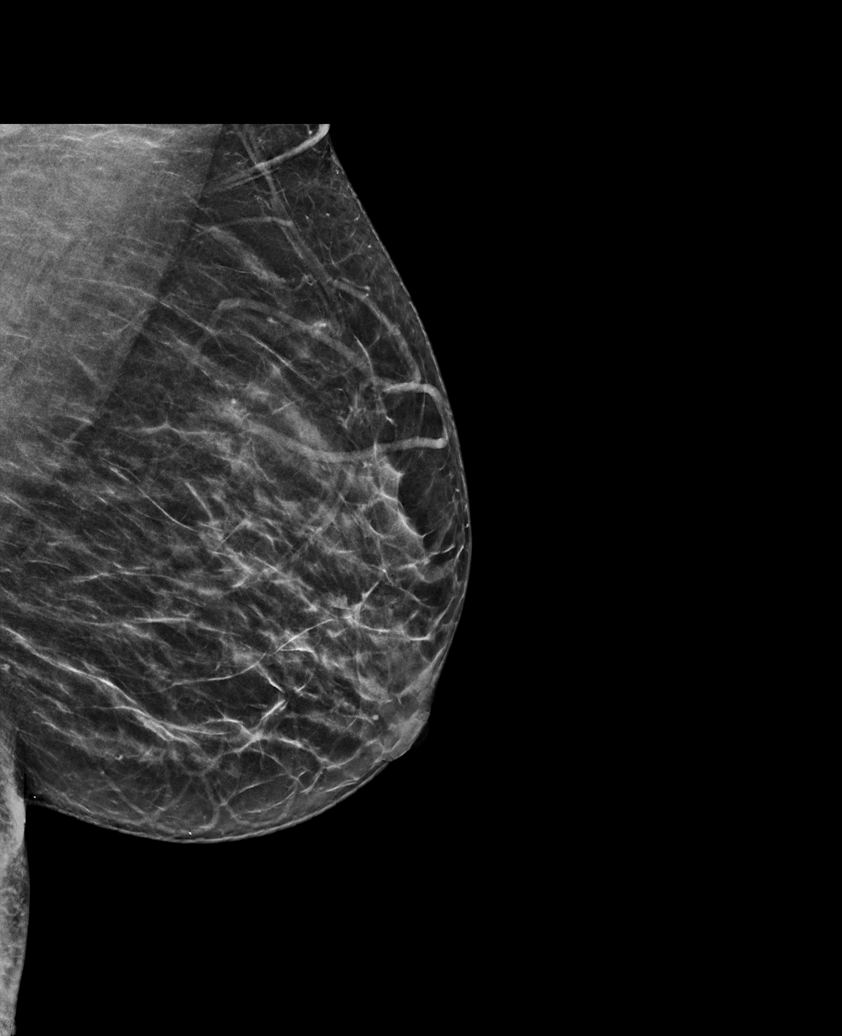

[L CC synth-2D]
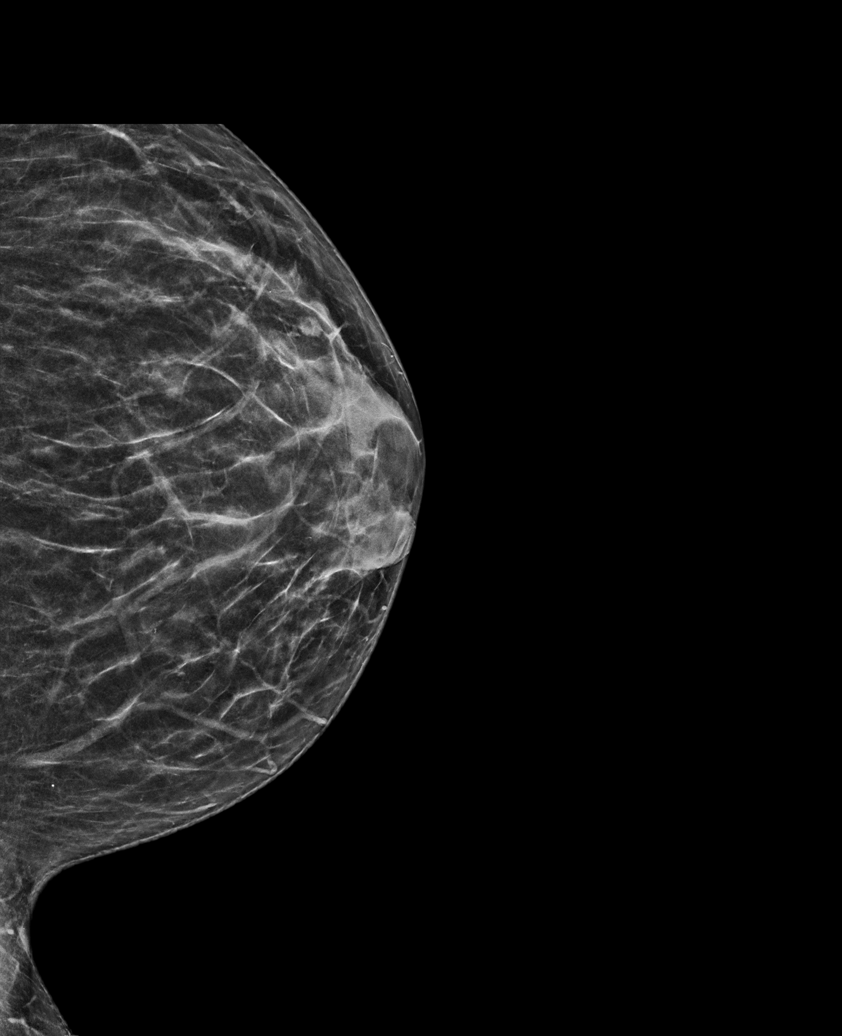

[R CC synth-2D]
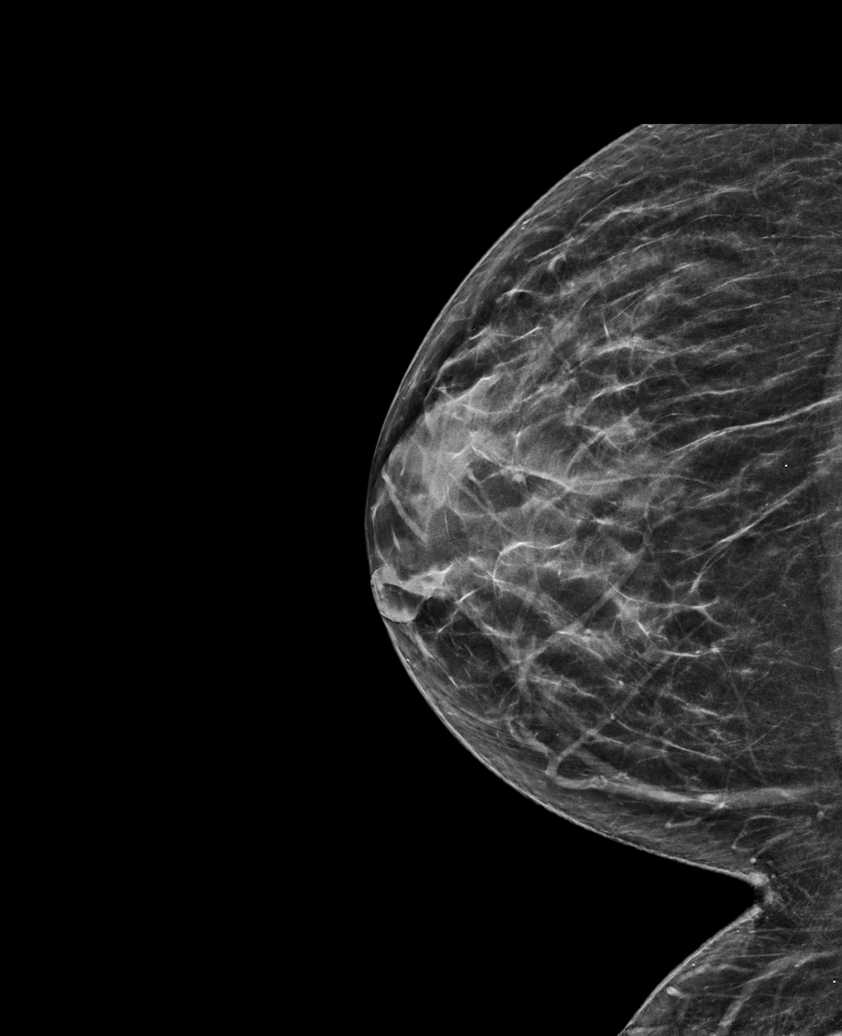

[L CC tomo · tomo slice 29/56.0]
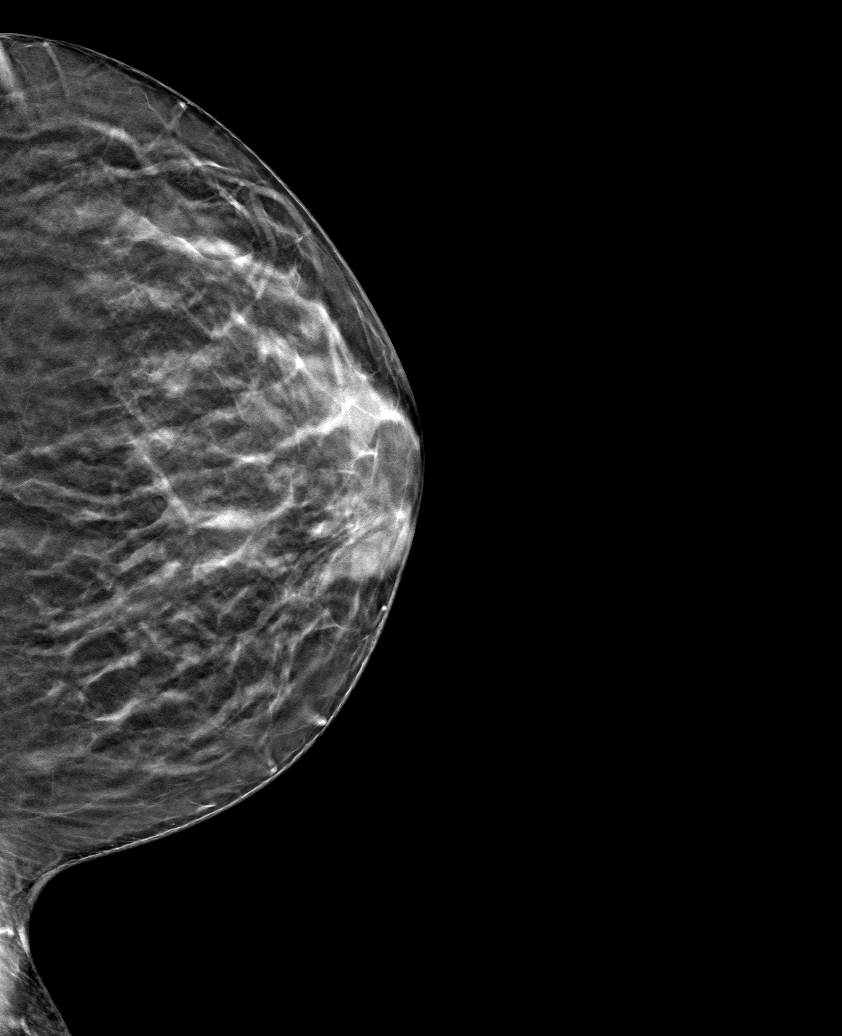

[6 of 30 positions shown; findings below may reference images not displayed]

Baseline.

ACR Breast Density Category c: The breast tissue is heterogeneously
dense, which may obscure small masses. B
FINDINGS: Mammographically normal appearing breasts with no findings
suspicious for malignancy in either breast. A small, normal
appearing intramammary lymph node is demonstrated in the anterior
upper outer left breast.

On physical exam, there is an approximately 5 mm oval,
circumscribed, palpable mass in the 2 o'clock position of the left
breast, 3 cm from the nipple. Clear and milky discharge was elicited
from 3 orifices in the left nipple. On questioning the patient, she
reported that the discharge on the left has also been present for
multiple years and from 4 orifices.

Targeted ultrasound of the left breast was performed, showing a 7 x
6 x 4 mm normal intramammary lymph node in the 2 o'clock position of
the left breast, 3 cm from the nipple. The remainder of the
examination was normal.
IMPRESSION: 1. No evidence of malignancy.
2. Chronic benign, physiological bilateral nipple discharge.

RECOMMENDATION:
Annual screening mammography beginning at age 40.

I have discussed the findings and recommendations with the patient.
If applicable, a reminder letter will be sent to the patient
regarding the next appointment.

BI-RADS CATEGORY  2: Benign.

## 2021-05-19 ENCOUNTER — Other Ambulatory Visit (HOSPITAL_BASED_OUTPATIENT_CLINIC_OR_DEPARTMENT_OTHER): Payer: Self-pay | Admitting: Obstetrics & Gynecology

## 2021-06-11 ENCOUNTER — Other Ambulatory Visit (HOSPITAL_COMMUNITY)
Admission: RE | Admit: 2021-06-11 | Discharge: 2021-06-11 | Disposition: A | Discharge status: Home / Self Care | Payer: Medicaid Other | Source: Ambulatory Visit | Attending: Family Medicine | Admitting: Family Medicine

## 2021-06-11 ENCOUNTER — Other Ambulatory Visit: Payer: Self-pay

## 2021-06-11 ENCOUNTER — Ambulatory Visit (INDEPENDENT_AMBULATORY_CARE_PROVIDER_SITE_OTHER): Payer: Medicaid Other | Admitting: Family Medicine

## 2021-06-11 ENCOUNTER — Encounter: Payer: Self-pay | Admitting: Family Medicine

## 2021-06-11 VITALS — BP 119/80 | HR 96 | Ht 63.0 in | Wt 198.8 lb

## 2021-06-11 DIAGNOSIS — N921 Excessive and frequent menstruation with irregular cycle: Secondary | ICD-10-CM

## 2021-06-11 DIAGNOSIS — N803 Endometriosis of pelvic peritoneum: Secondary | ICD-10-CM | POA: Diagnosis not present

## 2021-06-11 DIAGNOSIS — N643 Galactorrhea not associated with childbirth: Secondary | ICD-10-CM

## 2021-06-11 DIAGNOSIS — IMO0002 Reserved for concepts with insufficient information to code with codable children: Secondary | ICD-10-CM

## 2021-06-11 DIAGNOSIS — Z01419 Encounter for gynecological examination (general) (routine) without abnormal findings: Secondary | ICD-10-CM | POA: Diagnosis not present

## 2021-06-11 DIAGNOSIS — Z975 Presence of (intrauterine) contraceptive device: Secondary | ICD-10-CM

## 2021-06-11 DIAGNOSIS — R102 Pelvic and perineal pain: Secondary | ICD-10-CM

## 2021-06-11 DIAGNOSIS — G8929 Other chronic pain: Secondary | ICD-10-CM

## 2021-06-11 MED ORDER — MEDROXYPROGESTERONE ACETATE 10 MG PO TABS: 10.0000 mg | ORAL_TABLET | Freq: Every day | ORAL | 3 refills | Status: DC

## 2021-06-11 NOTE — Progress Notes (Signed)
   Subjective:    Patient ID: Kathryn Suarez, female    DOB: [birth year], 35 y.o.   MRN: [id number]  HPI Patient seen for breakthrough bleeding with IUD. Will have a normal period, then will have another episode of bleeding after having sex/orgasm. [place] IUD placed 5 years ago. The breakthrough bleeding has been increasing over past several months.  Still having galactorrhea and intermittent breast pain bilaterally. Did have mammogram and US done on 3/24 - intramammary lymph node seen.  Continues to have pelvic pain. Diagnosed with endometriosis after Dr. Clovia Cuff did a laparoscopy in 05/2018 with stage 1 endometriosis (2 lesions in posterior cul de sac that were fully removed and pathology confirmed endometriosis). After removal, patient reports having improvement with pain.  I have reviewed the patients past medical, family, and social history.  I have reviewed the patient's medication list and allergies.   Review of Systems     Objective:   Physical Exam Vitals reviewed. Exam conducted with a chaperone present.  Constitutional:      Appearance: Normal appearance.  HENT:     Head: Normocephalic and atraumatic.  Cardiovascular:     Rate and Rhythm: Normal rate and regular rhythm.  Chest:       Comments: No tenderness or other masses palpated Abdominal:     Hernia: There is no hernia in the left inguinal area or right inguinal area.  Genitourinary:    General: Normal vulva.     Labia:        Right: No rash, tenderness or lesion.        Left: No rash, tenderness or lesion.      Vagina: No signs of injury. No vaginal discharge or tenderness.     Lymphadenopathy:     Lower Body: No right inguinal adenopathy. No left inguinal adenopathy.  Skin:    Capillary Refill: Capillary refill takes less than 2 seconds.  Neurological:     General: No focal deficit present.     Mental Status: She is alert and oriented to person, place, and time.          Assessment & Plan:  1.  Galactorrhea Recheck prolactin level now and every 6 months. - Prolactin  2. Breakthrough bleeding with IUD Discussed options - could replace vs add progestin. Patient would like to add progestin for now.   3. Endometriosis of the cul-de-sac Discussed options of trial of Orlissa. Also could see endometriosis specialist in [facility] - patient would like to go there for evaluation - Ambulatory referral to Gynecology  4. Chronic pelvic pain in female - Ambulatory referral to Gynecology  5. Pap test, as part of routine gynecological examination PAP done today.

## 2021-06-11 NOTE — Progress Notes (Signed)
IUD placed 2017,  patient states having irregular bleeding with IUD

## 2021-06-12 ENCOUNTER — Telehealth: Payer: Self-pay

## 2021-06-12 ENCOUNTER — Other Ambulatory Visit: Payer: Medicaid Other

## 2021-06-12 NOTE — Telephone Encounter (Addendum)
-----   Message from Truett Mainland, DO sent at 06/11/2021  5:14 PM EDT ----- Regarding: referral Patient referred to [facility] minimally invasive gyn surgery due to Endometriosis.   [place] office notified to send appropriate records to office of Florence Canner, MD, endometriosis specialist.

## 2021-06-13 LAB — PROLACTIN: Prolactin: 19.2 ng/mL (ref 4.8–23.3)

## 2021-06-17 LAB — CYTOLOGY - PAP
Adequacy: ABSENT
Chlamydia: NEGATIVE
Comment: NEGATIVE
Comment: NEGATIVE
Comment: NEGATIVE
Comment: NORMAL
Diagnosis: NEGATIVE
High risk HPV: NEGATIVE
Neisseria Gonorrhea: NEGATIVE
Trichomonas: NEGATIVE

## 2021-06-23 DIAGNOSIS — N809 Endometriosis, unspecified: Secondary | ICD-10-CM | POA: Insufficient documentation

## 2021-07-01 ENCOUNTER — Emergency Department (HOSPITAL_COMMUNITY)
Admission: EM | Admit: 2021-07-01 | Discharge: 2021-07-02 | Disposition: A | Discharge status: Home / Self Care | Payer: Medicaid Other | Attending: Emergency Medicine | Admitting: Emergency Medicine

## 2021-07-01 ENCOUNTER — Other Ambulatory Visit: Payer: Self-pay

## 2021-07-01 ENCOUNTER — Encounter (HOSPITAL_COMMUNITY): Payer: Self-pay | Admitting: Emergency Medicine

## 2021-07-01 ENCOUNTER — Emergency Department (HOSPITAL_COMMUNITY): Payer: Medicaid Other

## 2021-07-01 DIAGNOSIS — I10 Essential (primary) hypertension: Secondary | ICD-10-CM | POA: Insufficient documentation

## 2021-07-01 DIAGNOSIS — N83201 Unspecified ovarian cyst, right side: Secondary | ICD-10-CM | POA: Diagnosis not present

## 2021-07-01 DIAGNOSIS — R102 Pelvic and perineal pain: Secondary | ICD-10-CM | POA: Diagnosis present

## 2021-07-01 DIAGNOSIS — J45909 Unspecified asthma, uncomplicated: Secondary | ICD-10-CM | POA: Insufficient documentation

## 2021-07-01 LAB — COMPREHENSIVE METABOLIC PANEL
ALT: 13 U/L (ref 0–44)
AST: 14 U/L — ABNORMAL LOW (ref 15–41)
Albumin: 3.8 g/dL (ref 3.5–5.0)
Alkaline Phosphatase: 58 U/L (ref 38–126)
Anion gap: 5 (ref 5–15)
BUN: 10 mg/dL (ref 6–20)
CO2: 26 mmol/L (ref 22–32)
Calcium: 9.1 mg/dL (ref 8.9–10.3)
Chloride: 104 mmol/L (ref 98–111)
Creatinine, Ser: 0.97 mg/dL (ref 0.44–1.00)
GFR, Estimated: >60 mL/min (ref >60)
Glucose, Bld: 89 mg/dL (ref 70–99)
Potassium: 3.7 mmol/L (ref 3.5–5.1)
Sodium: 135 mmol/L (ref 135–145)
Total Bilirubin: 0.6 mg/dL (ref 0.3–1.2)
Total Protein: 7.1 g/dL (ref 6.5–8.1)

## 2021-07-01 LAB — CBC
HCT: 37.2 % (ref 36.0–46.0)
Hemoglobin: 12.2 g/dL (ref 12.0–15.0)
MCH: 27.7 pg (ref 26.0–34.0)
MCHC: 32.8 g/dL (ref 30.0–36.0)
MCV: 84.4 fL (ref 80.0–100.0)
Platelets: 250 10*3/uL (ref 150–400)
RBC: 4.41 MIL/uL (ref 3.87–5.11)
RDW: 12.4 % (ref 11.5–15.5)
WBC: 8.5 10*3/uL (ref 4.0–10.5)
nRBC: 0 % (ref 0.0–0.2)

## 2021-07-01 LAB — I-STAT BETA HCG BLOOD, ED (MC, WL, AP ONLY): I-stat hCG, quantitative: <5 m[IU]/mL (ref <5)

## 2021-07-01 LAB — LIPASE, BLOOD: Lipase: 42 U/L (ref 11–51)

## 2021-07-01 MED ORDER — ONDANSETRON 4 MG PO TBDP
4.0000 mg | ORAL_TABLET | Freq: Once | ORAL | Status: AC
  Administered 2021-07-02: 4 mg via ORAL
  Filled 2021-07-01: qty 1

## 2021-07-01 MED ORDER — NAPROXEN 250 MG PO TABS
500.0000 mg | ORAL_TABLET | Freq: Once | ORAL | Status: AC
  Administered 2021-07-02: 500 mg via ORAL
  Filled 2021-07-01: qty 2

## 2021-07-01 NOTE — ED Triage Notes (Signed)
Pt here for abd pain. Pt has hx of ovarian cysts, believes her cyst is getting worse. Reports intermittent pain to lower abd that comes w/ dizziness and nausea. Pt took percocet w/o relief

## 2021-07-01 NOTE — ED Notes (Signed)
Pt very upset over wait time and states if more than two people go in front of her she will be leaving and cussing [place] out

## 2021-07-01 NOTE — ED Provider Notes (Signed)
[facility] Emergency Department Provider Note MRN:  [id number]  Arrival date & time: 07/02/21     Chief Complaint   Abdominal Pain   History of Present Illness   Kathryn Suarez is a 35 y.o. year-old female with a history of chronic pelvic pain, ovarian torsion presenting to the ED with chief complaint of abdominal pain.  Intermittent right lower pelvic pain for the past several weeks, worse over the past 2 days.  Described as a sharp pain, similar to prior issues with ovarian cysts.  Denies fever, no headache or vision change, no chest pain or shortness of breath, no upper abdominal pain, no vaginal bleeding or discharge.  Pain is severe when present, no exacerbating or alleviating factors.  Does also feel some pressure in the abdomen with urination.  Review of Systems  A complete 10 system review of systems was obtained and all systems are negative except as noted in the HPI and PMH.   Patient's Health History    Past Medical History:  Diagnosis Date   Asthma    Chronic pelvic pain in female    Endometriosis of the cul-de-sac    stage 1, biopsy proven with laparoscopy 05/2018   History of anemia    Hypertension    not currently being treated at this time- weight loss    Kidney stones    Ovarian torsion 2020   h/o LSO   Sickle cell trait [facility])     Past Surgical History:  Procedure Laterality Date   DILATION AND CURETTAGE OF UTERUS  03/23/2007   EXCISION OF TONGUE LESION     LAPAROSCOPIC OOPHERECTOMY Left 08/2019   in [place] 06/01/2018   Procedure: LAPAROSCOPY DIAGNOSTIC, PERITONEAL BIOPSY;  Surgeon: Emily Filbert, MD;  Location: [place];  Service: Gynecology;  Laterality: N/A;    Family History  Problem Relation Age of Onset   Hypertension Mother    Sickle cell trait Father     Social History   Socioeconomic History   Marital status: Single    Spouse name: Not on file   Number of children: Not on file    Years of education: Not on file   Highest education level: Not on file  Occupational History   Not on file  Tobacco Use   Smoking status: Never   Smokeless tobacco: Never  Vaping Use   Vaping Use: Never used  Substance and Sexual Activity   Alcohol use: No    Comment: occas   Drug use: No   Sexual activity: Yes    Birth control/protection: I.U.D.    Comment: with a woman  Other Topics Concern   Not on file  Social History Narrative   Not on file   Social Determinants of Health   Financial Resource Strain: Not on file  Food Insecurity: Not on file  Transportation Needs: Not on file  Physical Activity: Not on file  Stress: Not on file  Social Connections: Not on file  Intimate Partner Violence: Not on file     Physical Exam   Vitals:   07/02/21 0029 07/02/21 0030  BP: (!) 127/95 (!) 131/97  Pulse: (!) 128 77  Resp: 18   Temp:    SpO2: 100% 98%    CONSTITUTIONAL: Well-appearing, NAD NEURO:  Alert and oriented x 3, no focal deficits EYES:  eyes equal and reactive ENT/NECK:  no LAD, no JVD CARDIO: Regular rate, well-perfused, normal S1 and S2 PULM:  CTAB no wheezing or rhonchi GI/GU:  normal bowel sounds, non-distended, non-tender MSK/SPINE:  No gross deformities, no edema SKIN:  no rash, atraumatic PSYCH:  Appropriate speech and behavior  *Additional and/or pertinent findings included in MDM below  Diagnostic and Interventional Summary    EKG Interpretation  Date/Time:    Ventricular Rate:    PR Interval:    QRS Duration:   QT Interval:    QTC Calculation:   R Axis:     Text Interpretation:         Labs Reviewed  COMPREHENSIVE METABOLIC PANEL - Abnormal; Notable for the following components:      Result Value   AST 14 (*)    All other components within normal limits  LIPASE, BLOOD  CBC  URINALYSIS, ROUTINE W REFLEX MICROSCOPIC  I-STAT BETA HCG BLOOD, ED ([facility], [facility], [facility] ONLY)    US Pelvis Complete  Final Result    US Transvaginal Non-OB   Final Result    [place] Art/Ven Flow Abd Pelv Doppler  Final Result      Medications  naproxen (NAPROSYN) tablet 500 mg (500 mg Oral Given 07/02/21 0030)  ondansetron (ZOFRAN-ODT) disintegrating tablet 4 mg (4 mg Oral Given 07/02/21 0031)     Procedures  /  Critical Care Procedures  ED Course and Medical Decision Making  I have reviewed the triage vital signs, the nursing notes, and pertinent available records from the EMR.  Listed above are laboratory and imaging tests that I personally ordered, reviewed, and interpreted and then considered in my medical decision making (see below for details).  Chronic abdominal pain, likely related to endometriosis.  Also has a history of ovarian torsion, history of left oophorectomy.  Will obtain ultrasound to exclude signs of torsion on the right, otherwise there is little to no concern for appendicitis given the lack of tenderness on exam, no fever, reassuring labs.  Will advise GYN follow-up if work-up reassuring today.     Ultrasound confirms hemorrhagic cyst, no torsion.  Pain is well controlled, patient is appropriate for discharge with GYN follow-up.  Barth Kirks. Sedonia Small, [place] [email or web address]  Final Clinical Impressions(s) / ED Diagnoses     ICD-10-CM   1. Cyst of right ovary  N83.201       ED Discharge Orders          Ordered    ondansetron (ZOFRAN ODT) 4 MG disintegrating tablet  Every 8 hours PRN        07/02/21 0113             Discharge Instructions Discussed with and Provided to Patient:     Discharge Instructions      You were evaluated in the Emergency Department and after careful evaluation, we did not find any emergent condition requiring admission or further testing in the hospital.  Your exam/testing today was overall reassuring.  Symptoms seem to be due to an ovarian cyst.  Recommend close follow-up with your GYN as well as your pain doctor.  Can use  the Zofran as needed for nausea.  Please return to the Emergency Department if you experience any worsening of your condition.  Thank you for allowing [place] to be a part of your care.         Maudie Flakes, MD 07/02/21 [phone]

## 2021-07-02 ENCOUNTER — Telehealth: Payer: Self-pay | Admitting: Family Medicine

## 2021-07-02 LAB — URINALYSIS, ROUTINE W REFLEX MICROSCOPIC
Bilirubin Urine: NEGATIVE
Glucose, UA: NEGATIVE mg/dL
Hgb urine dipstick: NEGATIVE
Ketones, ur: NEGATIVE mg/dL
Leukocytes,Ua: NEGATIVE
Nitrite: NEGATIVE
Protein, ur: NEGATIVE mg/dL
Specific Gravity, Urine: 1.011 (ref 1.005–1.030)
pH: 6 (ref 5.0–8.0)

## 2021-07-02 MED ORDER — ONDANSETRON 4 MG PO TBDP
4.0000 mg | ORAL_TABLET | Freq: Three times a day (TID) | ORAL | 0 refills | Status: DC | PRN
Start: 2021-07-02 — End: 2022-08-14

## 2021-07-02 NOTE — Telephone Encounter (Signed)
Patient said she was told by this office to go to the ED, she said they advised her to call her to give an update on her visit, can you please call the patient

## 2021-07-02 NOTE — Telephone Encounter (Signed)
Spoke with pt. Pt calling to have follow up appt from ER on 07/01/21. Pt was advised to have appt with provider at ER. Pt states only having mild pain at ovary and has been taking Tylenol, Motrin and Oxy that helps relieve pain.   Pt states has [facility] referral appt in the next 2 weeks for endometrosis and chronic pelvic pain.  Pt has follow up appt with Dr Ilda Basset on 07/16/21. Pt advised to keep this appt for follow up.  ER precautions advised while pt out of town from 7/26-8/2. Pt verbalized understanding and agreeable to plan of care.   Colletta Maryland, RN

## 2021-07-02 NOTE — Discharge Instructions (Addendum)
You were evaluated in the Emergency Department and after careful evaluation, we did not find any emergent condition requiring admission or further testing in the hospital.  Your exam/testing today was overall reassuring.  Symptoms seem to be due to an ovarian cyst.  Recommend close follow-up with your GYN as well as your pain doctor.  Can use the Zofran as needed for nausea.  Please return to the Emergency Department if you experience any worsening of your condition.  Thank you for allowing [place] to be a part of your care.

## 2021-07-02 NOTE — ED Notes (Signed)
Patient transported to Ultrasound 

## 2021-07-03 LAB — URINE CULTURE: Culture: 10000 — AB

## 2021-07-04 ENCOUNTER — Telehealth: Payer: Self-pay | Admitting: Emergency Medicine

## 2021-07-04 NOTE — Telephone Encounter (Unsigned)
Post ED Visit - Positive Culture Follow-up: Unsuccessful Patient Follow-up  Culture assessed and recommendations reviewed by:  '[]'$  Elenor Quinones, Pharm.D. '[]'$  Heide Guile, Pharm.D., BCPS AQ-ID '[]'$  Parks Neptune, Pharm.D., BCPS '[]'$  Alycia Rossetti, Pharm.D., BCPS '[]'$  [place], [place].D., BCPS, AAHIVP '[]'$  Legrand Como, Pharm.D., BCPS, AAHIVP '[x]'$  Lorelei Pont, PharmD '[]'$  Vincenza Hews, PharmD, BCPS  Positive urine culture  '[x]'$  Patient discharged without antimicrobial prescription and treatment is now indicated '[]'$  Organism is resistant to prescribed ED discharge antimicrobial '[]'$  Patient with positive blood cultures   Unable to contact patient at phone number on file, letter will be sent to address on file  Milus Mallick 07/04/2021, 2:55 PM

## 2021-07-04 NOTE — Progress Notes (Signed)
ED Antimicrobial Stewardship Positive Culture Follow Up   Kathryn Suarez is an 35 y.o. female who presented to [facility] on 07/01/2021 with a chief complaint of  Chief Complaint  Patient presents with   Abdominal Pain    Recent Results (from the past 720 hour(s))  Urine Culture     Status: Abnormal   Collection Time: 07/02/21 12:51 AM   Specimen: Urine, Clean Catch  Result Value Ref Range Status   Specimen Description URINE, CLEAN CATCH  Final   Special Requests   Final    NONE Performed at [facility], [address] [place], [place] [address]    Culture 10,000 COLONIES/mL STAPHYLOCOCCUS EPIDERMIDIS (A)  Final   Report Status 07/03/2021 FINAL  Final    Clinically insignificant bacterial count; not indicative of treatment. Recommend calling patient to inform of results and that it is likely contaminant. Maintain follow-up appointments.  ED Provider: MD Curlene Labrum, PharmD, BCPS 07/04/2021 10:41 AM ED Clinical Pharmacist -  [phone]

## 2021-07-15 ENCOUNTER — Other Ambulatory Visit: Payer: Self-pay

## 2021-07-15 ENCOUNTER — Encounter: Payer: Self-pay | Admitting: Physical Therapy

## 2021-07-15 ENCOUNTER — Encounter: Payer: Medicaid Other | Attending: Obstetrics & Gynecology | Admitting: Physical Therapy

## 2021-07-15 DIAGNOSIS — R278 Other lack of coordination: Secondary | ICD-10-CM

## 2021-07-15 DIAGNOSIS — R252 Cramp and spasm: Secondary | ICD-10-CM | POA: Diagnosis present

## 2021-07-15 DIAGNOSIS — M6281 Muscle weakness (generalized): Secondary | ICD-10-CM | POA: Diagnosis present

## 2021-07-15 NOTE — Therapy (Signed)
[place] at [facility] [address] [place], [place], [address] Phone: [phone]   Fax:  [phone]  Physical Therapy Evaluation  Patient Details  Name: Kathryn Suarez MRN: [id number] Date of Birth: 1986/03/30 Referring Provider (PT): Dr. Hale Bogus   Encounter Date: 07/15/2021   PT End of Session - 07/15/21 0931     Visit Number 1    Date for PT Re-Evaluation 11/14/21    Authorization Type Medicaid    PT Start Time 0830    PT Stop Time 0915    PT Time Calculation (min) 45 min    Activity Tolerance Patient tolerated treatment well;Patient limited by pain    Behavior During Therapy [facility] for tasks assessed/performed             Past Medical History:  Diagnosis Date   Asthma    Chronic pelvic pain in female    Endometriosis of the cul-de-sac    stage 1, biopsy proven with laparoscopy 05/2018   History of anemia    Hypertension    not currently being treated at this time- weight loss    Kidney stones    Ovarian torsion 2020   h/o LSO   Sickle cell trait [facility])     Past Surgical History:  Procedure Laterality Date   DILATION AND CURETTAGE OF UTERUS  03/23/2007   EXCISION OF TONGUE LESION     LAPAROSCOPIC OOPHERECTOMY Left 08/2019   in [place] 06/01/2018   Procedure: LAPAROSCOPY DIAGNOSTIC, PERITONEAL BIOPSY;  Surgeon: Emily Filbert, MD;  Location: [place];  Service: Gynecology;  Laterality: N/A;    There were no vitals filed for this visit.    Subjective Assessment - 07/15/21 0834     Subjective Patient reports pain since she was 35 years old. She has bad abeominal pain in her stomach and pelvis. Pain with sex. Will have surgery in the future.    Patient Stated Goals reduce the pelvic pain and stiffness    Currently in Pain? Yes    Pain Score 9     Pain Location Abdomen    Pain Orientation Lower    Pain Descriptors / Indicators Sharp;Stabbing    Pain Type Chronic pain     Pain Onset More than a month ago    Pain Frequency Constant    Aggravating Factors  sit too long, waling too long, activity    Pain Relieving Factors fetal position    Multiple Pain Sites Yes    Pain Score 10    Pain Location Pelvis    Pain Orientation Lower    Pain Descriptors / Indicators Cramping;Stabbing    Pain Type Chronic pain    Pain Onset More than a month ago    Pain Frequency Constant    Aggravating Factors  intercourse with deep and initial penetration, same sex marriage, vaginal exam, on a IUD    Pain Relieving Factors no vaginal penetration    Pain Score 6    Pain Location Back    Pain Orientation Lower    Pain Descriptors / Indicators Sharp    Pain Type Chronic pain    Pain Onset More than a month ago    Pain Frequency Intermittent    Aggravating Factors  when stomack hurts, when sitting a certian way    Pain Relieving Factors swimming                [facility] PT Assessment -  07/15/21 0001       Assessment   Medical Diagnosis M62.89 Pelvic floor dysfunction; R10.2, G89.29 Chronic pelvic pain in female    Referring Provider (PT) Dr. Hale Bogus    Onset Date/Surgical Date --   chronic since 35 years old   Prior Therapy in the past but does not remember      Precautions   Precautions None      Restrictions   Weight Bearing Restrictions No      Balance Screen   Has the patient fallen in the past 6 months No    Has the patient had a decrease in activity level because of a fear of falling?  No    Is the patient reluctant to leave their home because of a fear of falling?  No      Home Ecologist residence      Prior Function   Level of Independence Independent    Vocation Part time employment    Vocation Requirements Emergency planning/management officer    Leisure walk daily, dance class, swimming      Cognition   Overall Cognitive Status Within Functional Limits for tasks assessed      Observation/Other Assessments   Focus on Therapeutic  Outcomes (FOTO)  PFIQ-7 76; UIQ-7 38; POPIQ-7 38      Posture/Postural Control   Posture/Postural Control Postural limitations    Posture Comments stayw in a bent position      ROM / Strength   AROM / PROM / Strength AROM;PROM;Strength      AROM   Lumbar Flexion has trouble coming up to neutral    Lumbar - Right Side Bend decreased by 25%      Strength   Left Hip Extension 4/5    Left Hip ABduction 3+/5    Left Hip ADduction 4/5      Palpation   SI assessment  right ilium is anteriorly rotated;    Palpation comment tender throughout the lower abdomen, bilateral gluteal, levator ani, alont the right SI joint      Pelvic Compression   Findings Positive    Side Right    comment pain                        Objective measurements completed on examination: See above findings.     Pelvic Floor Special Questions - 07/15/21 0001     Are you Pregnant or attempting pregnancy? No    Prior Pregnancies Yes    Number of Pregnancies 1   miscarriage   Currently Sexually Active Yes    Is this Painful Yes    Urinary Leakage Yes    Activities that cause leaking With strong urge;Other    Other activities that cause leaking when not getting to the bathroom, touch stomach when has to urinate    Urinary frequency every hour    Fecal incontinence No   strain ot ahve a bowel movement, every 3 days, 2x/week, type 1   Fluid intake lots of water    Exam Type Deferred   due to being on her cycle and increased pain                     PT Education - 07/15/21 0931     Education Details educated patient on using heat to the back and abdomen; you tube videos for body scanning and meditation    Person(s) Educated Patient  Methods Explanation;Handout    Comprehension Verbalized understanding              PT Short Term Goals - 07/15/21 1031       PT SHORT TERM GOAL #1   Title independent with initial HEP    Baseline not educated yet    Time 4    Period  Weeks    Status New    Target Date 08/12/21               PT Long Term Goals - 07/15/21 1031       PT LONG TERM GOAL #1   Title independent with advanced HEP for pelvic floor relaxation, pelvic floor coordination, and core strength    Baseline not educated yet    Time 4    Period Months    Status New    Target Date 11/14/21      PT LONG TERM GOAL #2   Title ability to relax the anal sphincter to push stool out with correct toileting technique so she is not straining to have a bowel movement and the stool are Type 3 or 4    Baseline has to strain to have a bowel movement and stool are Type 1    Time 4    Period Months    Status New    Target Date 11/14/21      PT LONG TERM GOAL #3   Title ability to have vaginal penetration for vaginal exam and penetration for intercourse due to the pelvic floor muscles are relaxed and pain decreased >/= 3/10    Baseline pain level is 10/10    Time 4    Period Months    Status New    Target Date 11/14/21      PT LONG TERM GOAL #4   Title PFIQ-7 decreased </= 50 due to improvement of symptoms    Baseline PFIQ-7 score is 76    Time 4    Period Months    Status New    Target Date 11/14/21      PT LONG TERM GOAL #5   Title Understand ways to manage her pain and reduce while she is having a flare-up.    Baseline not educated yet    Time 4    Period Months    Status New    Target Date 11/14/21                    Plan - 07/15/21 0920     Clinical Impression Statement Patient is a 35 year old female with pelvic pain since she was 53 year s old. She has had multiple pelvic surgeries including for ovary rmoval and endometriosis. Patient reports her constant lower abdominal pain is 9/10 which increases with activity. Her constant pelvic pain is 10/10 with increases with vaginal penetration. Her intermittent back pain  is at 6/10 with abdominal pain. She reports she will leak urine with touching her abcomen and when she holds  her urine too long. Patient will have a Type 1 bowel movement 2 times per week and has to strain to have one. Today patient deferred vaginal exam internal and external due to being on her cycle and increased pain today. She has increased pain with palpation of the lower abdominal, lumbar paraspinals, levator ani, gluteals, and right SI joint. Left hip strength  averages 3+/5. Rgith ilium is rotated anteriorly. Right sidebending decreased by 25%. She ahs full lumbar flexion but has  difficulty coming back into nuetral. Patient stays in a flexed position during treatment. She has difficulty with laying on her back and stomach. PFIQ-7 IS 76, UIQ-7 IS 38 , AND POPIQ-7 SOCRE IS 38.  Patient will benefit from skilled therapy to reduce her pain to a manageble level, reduce pelvic floor tone to improve urinary leakage and constipation.    Personal Factors and Comorbidities Sex;Comorbidity 3+;Education;Past/Current Experience;Profession    Comorbidities ovarianl torsion 2020; cyst on right ovary 07/01/2021; eno of cul-de-sac stage 1 laproscopic surgery on 05/2018    Examination-Activity Limitations Bed Mobility;Bathing;Sit;Sleep;Bend;Squat;Caring for Others;Stand;Continence;Toileting;Lift;Locomotion Level    Examination-Participation Restrictions Cleaning;Community Activity;Shop;Driving;Interpersonal Relationship;Laundry    Stability/Clinical Decision Making Evolving/Moderate complexity    Clinical Decision Making Moderate    Rehab Potential Good    PT Frequency 1x / week    PT Duration Other (comment)   4 months   PT Treatment/Interventions ADLs/Self Care Home Management;Biofeedback;Cryotherapy;Electrical Stimulation;Moist Heat;Ultrasound;Functional mobility training;Therapeutic activities;Therapeutic exercise;Neuromuscular re-education;Patient/family education;Manual techniques;Dry needling;Taping;Spinal Manipulations;Joint Manipulations    PT Next Visit Plan correct ilium, hip stretches, diaphgramatic breathing,  abdominal massage, finish exam on the pelvic floor and use a Q-tip for penetration    Consulted and Agree with Plan of Care Patient             Patient will benefit from skilled therapeutic intervention in order to improve the following deficits and impairments:  Decreased endurance, Impaired tone, Pain, Decreased activity tolerance, Decreased strength, Decreased mobility, Increased muscle spasms, Decreased coordination  Visit Diagnosis: Muscle weakness (generalized) - Plan: PT plan of care cert/re-cert  Other lack of coordination - Plan: PT plan of care cert/re-cert  Cramp and spasm - Plan: PT plan of care cert/re-cert     Problem List Patient Active Problem List   Diagnosis Date Noted   Sickle cell trait ([place]) 12/01/2020   Endometriosis of the cul-de-sac    Morbid obesity ([place]) 11/30/2017   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

## 2021-07-16 ENCOUNTER — Encounter: Payer: Self-pay | Admitting: Obstetrics and Gynecology

## 2021-07-16 ENCOUNTER — Ambulatory Visit (INDEPENDENT_AMBULATORY_CARE_PROVIDER_SITE_OTHER): Payer: Medicaid Other | Admitting: Obstetrics and Gynecology

## 2021-07-16 DIAGNOSIS — N83209 Unspecified ovarian cyst, unspecified side: Secondary | ICD-10-CM | POA: Insufficient documentation

## 2021-07-16 DIAGNOSIS — N83201 Unspecified ovarian cyst, right side: Secondary | ICD-10-CM

## 2021-07-16 NOTE — Progress Notes (Signed)
Kathryn Suarez presents for ER follow up for hemorrhagic cyst. She reports her pain has improved.  Has IUD in placed  PE AF VSS Lungs clear Heart RRR Abd soft + BS  A/P Hemorrhagic cyst  Will schedule f/u U/S. F/U in the office per results.

## 2021-07-22 ENCOUNTER — Encounter: Payer: Medicaid Other | Admitting: Physical Therapy

## 2021-07-22 ENCOUNTER — Other Ambulatory Visit: Payer: Self-pay

## 2021-07-22 ENCOUNTER — Encounter: Payer: Self-pay | Admitting: Physical Therapy

## 2021-07-22 DIAGNOSIS — M6281 Muscle weakness (generalized): Secondary | ICD-10-CM

## 2021-07-22 DIAGNOSIS — R252 Cramp and spasm: Secondary | ICD-10-CM

## 2021-07-22 DIAGNOSIS — R278 Other lack of coordination: Secondary | ICD-10-CM

## 2021-07-22 NOTE — Patient Instructions (Signed)
Access Code: [email or web address] URL: [email or web address] Date: 07/22/2021 Prepared by: Earlie Counts  Exercises Supine Double Knee to Chest - 1 x daily - 7 x weekly - 1 sets - 2 reps - 15 sec hold Supine Figure 4 Piriformis Stretch with Leg Extension - 1 x daily - 7 x weekly - 1 sets - 2 reps - 30 sec hold Supine Pelvic Floor Stretch - 1 x daily - 7 x weekly - 1 sets - 1 reps - 1 min hold Child's Pose Stretch - 1 x daily - 7 x weekly - 1 sets - 1 reps - 30 sec hold Half Kneeling Hip Flexor Stretch with Tri-Planar Reach - 1 x daily - 7 x weekly - 1 sets - 1 reps - 30 sec hold Supine Diaphragmatic Breathing - 2 x daily - 7 x weekly - 1 sets - 10 reps  Earlie Counts, PT [facility] [address], [place], [place] [address] W: [phone] [email or web address]

## 2021-07-22 NOTE — Therapy (Addendum)
[place] at [facility] [address] [place], [place], [address] Phone: [phone]   Fax:  [phone]  Physical Therapy Treatment  Patient Details  Name: Kathryn Suarez MRN: [id number] Date of Birth: July 22, 1986 Referring Provider (PT): Dr. Hale Bogus   Encounter Date: 07/22/2021   PT End of Session - 07/22/21 0936     Visit Number 2    Date for PT Re-Evaluation 11/14/21    Authorization Type Medicaid    Authorization Time Period 8/9-8/29    Authorization - Visit Number 1    Authorization - Number of Visits 3    PT Start Time 0930    PT Stop Time 3903    PT Time Calculation (min) 45 min    Activity Tolerance Patient tolerated treatment well;Patient limited by pain    Behavior During Therapy [facility] for tasks assessed/performed             Past Medical History:  Diagnosis Date   Asthma    Chronic pelvic pain in female    Endometriosis of the cul-de-sac    stage 1, biopsy proven with laparoscopy 05/2018   History of anemia    Hypertension    not currently being treated at this time- weight loss    Kidney stones    Ovarian torsion 2020   h/o LSO   Sickle cell trait ([place])     Past Surgical History:  Procedure Laterality Date   DILATION AND CURETTAGE OF UTERUS  03/23/2007   EXCISION OF TONGUE LESION     LAPAROSCOPIC OOPHERECTOMY Left 08/2019   in [place] 06/01/2018   Procedure: LAPAROSCOPY DIAGNOSTIC, PERITONEAL BIOPSY;  Surgeon: Emily Filbert, MD;  Location: [place];  Service: Gynecology;  Laterality: N/A;    There were no vitals filed for this visit.   Subjective Assessment - 07/22/21 0930     Subjective I have stopped bleeding.    Patient Stated Goals reduce the pelvic pain and stiffness    Currently in Pain? Yes    Pain Score 4     Pain Location Abdomen    Pain Orientation Lower    Pain Descriptors / Indicators Sharp;Stabbing    Pain Type Chronic pain    Pain Onset  More than a month ago    Pain Frequency Constant    Aggravating Factors  sit too long, walkng too long, activity    Pain Relieving Factors fetal position    Multiple Pain Sites Yes    Pain Score 5    Pain Location Pelvis    Pain Orientation Lower    Pain Descriptors / Indicators Cramping;Stabbing    Pain Type Chronic pain    Pain Onset More than a month ago    Pain Frequency Constant    Aggravating Factors  intercourse with deep and initial penetration, same sex marriage, vaginal exam    Pain Relieving Factors no vaginal penetration    Pain Score 5    Pain Location Back    Pain Orientation Lower    Pain Descriptors / Indicators Sharp    Pain Type Chronic pain    Pain Onset More than a month ago    Pain Frequency Intermittent    Aggravating Factors  when stomach hurts, when sitting in a certian way    Pain Relieving Factors swimming  [place] Adult PT Treatment/Exercise - 07/22/21 0001       Lumbar Exercises: Stretches   Double Knee to Chest Stretch 2 reps;10 seconds    Hip Flexor Stretch Right;Left;1 rep;30 seconds    Hip Flexor Stretch Limitations lunge on floor    Quadruped Mid Back Stretch 1 rep;30 seconds    Quadruped Mid Back Stretch Limitations childs pose    Piriformis Stretch Right;Left;1 rep;30 seconds    Piriformis Stretch Limitations supine    Other Lumbar Stretch Exercise Happy baby with breath into the pelvic floor      Lumbar Exercises: Sidelying   Other Sidelying Lumbar Exercises open boodk exercise to improve rib expansion and trunk rotation 10x each side      Lumbar Exercises: Quadruped   [place]/Old Horse 10 reps      Manual Therapy   Manual Therapy Soft tissue mobilization;Myofascial release;Joint mobilization    Joint Mobilization side glide to T11-L3 on both sides; gapping of the right side of L1-L5    Soft tissue mobilization manual mobilization of the lumbar paraspinals, quadratus, right spoas, righ  tobliques    Myofascial Release using suction cups to the low thoracic and lumbar area to open up the fascia; fasical release to the right lower abdominal area to release the restrictions and moniitor for pain                    PT Education - 07/22/21 1035     Education Details Access Code: [email or web address]    Person(s) Educated Patient    Methods Explanation;Demonstration;Verbal cues;Handout    Comprehension Verbalized understanding;Returned demonstration              PT Short Term Goals - 07/15/21 1031       PT SHORT TERM GOAL #1   Title independent with initial HEP    Baseline not educated yet    Time 4    Period Weeks    Status New    Target Date 08/12/21               PT Long Term Goals - 07/15/21 1031       PT LONG TERM GOAL #1   Title independent with advanced HEP for pelvic floor relaxation, pelvic floor coordination, and core strength    Baseline not educated yet    Time 4    Period Months    Status New    Target Date 11/14/21      PT LONG TERM GOAL #2   Title ability to relax the anal sphincter to push stool out with correct toileting technique so she is not straining to have a bowel movement and the stool are Type 3 or 4    Baseline has to strain to have a bowel movement and stool are Type 1    Time 4    Period Months    Status New    Target Date 11/14/21      PT LONG TERM GOAL #3   Title ability to have vaginal penetration for vaginal exam and penetration for intercourse due to the pelvic floor muscles are relaxed and pain decreased >/= 3/10    Baseline pain level is 10/10    Time 4    Period Months    Status New    Target Date 11/14/21      PT LONG TERM GOAL #4   Title PFIQ-7 decreased </= 50 due to improvement of symptoms    Baseline PFIQ-7 score is 76  Time 4    Period Months    Status New    Target Date 11/14/21      PT LONG TERM GOAL #5   Title Understand ways to manage her pain and reduce while she is having a flare-up.     Baseline not educated yet    Time 4    Period Months    Status New    Target Date 11/14/21                   Plan - 07/22/21 1036     Clinical Impression Statement Patient has trigger points in the right quadratus, right psoas and lower abdominal area. When she moves her lumbar spine into extension the vertebrae are rotated to the left. She did well with the manual work but therapist did have to adjust her pressure due to the pain the patient was in. Patient has not met goals due to just starting therapy. Patient will benefit from skilled therapy to reduce her pain to a manageble level, reduce pelvic floor tone, to improve urinary leakage and constipation.    Personal Factors and Comorbidities Sex;Comorbidity 3+;Education;Past/Current Experience;Profession    Comorbidities ovarianl torsion 2020; cyst on right ovary 07/01/2021; eno of cul-de-sac stage 1 laproscopic surgery on 05/2018    Examination-Activity Limitations Bed Mobility;Bathing;Sit;Sleep;Bend;Squat;Caring for Others;Stand;Continence;Toileting;Lift;Locomotion Level    Examination-Participation Restrictions Cleaning;Community Activity;Shop;Driving;Interpersonal Relationship;Laundry    Stability/Clinical Decision Making Evolving/Moderate complexity    Rehab Potential Good    PT Frequency 1x / week    PT Duration Other (comment)   4 months   PT Treatment/Interventions ADLs/Self Care Home Management;Biofeedback;Cryotherapy;Electrical Stimulation;Moist Heat;Ultrasound;Functional mobility training;Therapeutic activities;Therapeutic exercise;Neuromuscular re-education;Patient/family education;Manual techniques;Dry needling;Taping;Spinal Manipulations;Joint Manipulations    PT Next Visit Plan correct ilium, diaphgramatic breathing, abdominal massage, finish exam on the pelvic floor and use a Q-tip for penetration; toileting technique    PT Home Exercise Plan Access Code: [email or web address]    Recommended Other Services MD signed initial note     Consulted and Agree with Plan of Care Patient             Patient will benefit from skilled therapeutic intervention in order to improve the following deficits and impairments:  Decreased endurance, Impaired tone, Pain, Decreased activity tolerance, Decreased strength, Decreased mobility, Increased muscle spasms, Decreased coordination  Visit Diagnosis: Muscle weakness (generalized)  Other lack of coordination  Cramp and spasm     Problem List Patient Active Problem List   Diagnosis Date Noted   Ovarian cyst 07/16/2021   Sickle cell trait ([place]) 12/01/2020   Endometriosis of the cul-de-sac    Morbid obesity ([place]) 11/30/2017   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 [place], [place], [address] Phone: [phone]   Fax:  [phone]  Name: ILENE WITCHER MRN: [id number] Date of Birth: Dec 16, 1985   PHYSICAL THERAPY DISCHARGE SUMMARY  Visits from Start of Care: 2  Current functional level related to goals / functional outcomes: See above. Patient no-showed to her last 2 visits so she is being discharged due to the attendance policy.    Remaining deficits: See above.    Education / Equipment: HEP   tha Patient agrees to discharge. Patient goals were not met. Patient is being discharged due to not returning since the last visit. Thank you for the referral. Malachy Mood  Pearline Cables, PT 10/02/21 2:41 PM

## 2021-07-23 ENCOUNTER — Ambulatory Visit
Admission: RE | Admit: 2021-07-23 | Discharge: 2021-07-23 | Disposition: A | Discharge status: Home / Self Care | Payer: Medicaid Other | Source: Ambulatory Visit | Attending: Obstetrics and Gynecology | Admitting: Obstetrics and Gynecology

## 2021-07-23 DIAGNOSIS — N83201 Unspecified ovarian cyst, right side: Secondary | ICD-10-CM | POA: Insufficient documentation

## 2021-07-25 ENCOUNTER — Telehealth: Payer: Self-pay

## 2021-07-25 DIAGNOSIS — N83201 Unspecified ovarian cyst, right side: Secondary | ICD-10-CM

## 2021-07-25 NOTE — Telephone Encounter (Signed)
-----   Message from Chancy Milroy, MD sent at 07/24/2021  4:51 PM EDT ----- Please schedule f/u U/S for ovarian cyst in 4 months. Pt is aware. Thanks Legrand Como

## 2021-07-25 NOTE — Telephone Encounter (Signed)
Call placed to pt. Pt advised of [place] follow up per Dr Rip Harbour. Pt agreeable to plan of care. Pt requests message to be sent to mychart once [place] appt made. Also cancelled follow up with Dr Roselie Awkward on 8/15 due to having previous appt with Dr Rip Harbour on 07/16/21.Pt aware.  Mychart message sent to pt.     [place] scheduled for 12/12 at [place] at [facility] hospital.   [place]ta [place], [place]

## 2021-07-28 ENCOUNTER — Ambulatory Visit: Payer: Medicaid Other | Admitting: Obstetrics & Gynecology

## 2021-07-29 ENCOUNTER — Encounter: Payer: Medicaid Other | Admitting: Physical Therapy

## 2021-08-05 ENCOUNTER — Telehealth: Payer: Self-pay | Admitting: Physical Therapy

## 2021-08-05 ENCOUNTER — Encounter: Payer: Medicaid Other | Admitting: Physical Therapy

## 2021-08-05 NOTE — Telephone Encounter (Signed)
Called patient about her missed appointment today at 9:30. She said she did not get a phone call. In our records it was documented she was called on 08/04/2021 and confirmed her appointment. Patient was made aware of our attendance policy.  Earlie Counts, PT '@8'$ /23/2022@ 9:57 AM'

## 2021-10-02 ENCOUNTER — Telehealth: Payer: Self-pay | Admitting: Physical Therapy

## 2021-10-02 ENCOUNTER — Encounter: Payer: Medicaid Other | Attending: Obstetrics & Gynecology | Admitting: Physical Therapy

## 2021-10-02 DIAGNOSIS — M6281 Muscle weakness (generalized): Secondary | ICD-10-CM | POA: Insufficient documentation

## 2021-10-02 DIAGNOSIS — R278 Other lack of coordination: Secondary | ICD-10-CM | POA: Insufficient documentation

## 2021-10-02 NOTE — Telephone Encounter (Signed)
Called patient about her missed appointment today at 1400. Left a message and let her know she is to be discharged due to the attendance policy.  Earlie Counts, PT @10 /20/2022@ 2:38 PM

## 2021-10-22 ENCOUNTER — Emergency Department (HOSPITAL_COMMUNITY): Payer: Medicaid Other

## 2021-10-22 ENCOUNTER — Encounter (HOSPITAL_COMMUNITY): Payer: Self-pay | Admitting: Emergency Medicine

## 2021-10-22 ENCOUNTER — Emergency Department (HOSPITAL_COMMUNITY)
Admission: EM | Admit: 2021-10-22 | Discharge: 2021-10-22 | Disposition: A | Discharge status: Home / Self Care | Payer: Medicaid Other | Attending: Emergency Medicine | Admitting: Emergency Medicine

## 2021-10-22 ENCOUNTER — Other Ambulatory Visit: Payer: Self-pay

## 2021-10-22 DIAGNOSIS — D72829 Elevated white blood cell count, unspecified: Secondary | ICD-10-CM | POA: Diagnosis not present

## 2021-10-22 DIAGNOSIS — R102 Pelvic and perineal pain: Secondary | ICD-10-CM | POA: Diagnosis not present

## 2021-10-22 DIAGNOSIS — J45909 Unspecified asthma, uncomplicated: Secondary | ICD-10-CM | POA: Insufficient documentation

## 2021-10-22 DIAGNOSIS — Z20822 Contact with and (suspected) exposure to covid-19: Secondary | ICD-10-CM | POA: Insufficient documentation

## 2021-10-22 DIAGNOSIS — J3489 Other specified disorders of nose and nasal sinuses: Secondary | ICD-10-CM | POA: Diagnosis not present

## 2021-10-22 DIAGNOSIS — J00 Acute nasopharyngitis [common cold]: Secondary | ICD-10-CM | POA: Insufficient documentation

## 2021-10-22 DIAGNOSIS — I10 Essential (primary) hypertension: Secondary | ICD-10-CM | POA: Insufficient documentation

## 2021-10-22 DIAGNOSIS — R0981 Nasal congestion: Secondary | ICD-10-CM | POA: Diagnosis present

## 2021-10-22 LAB — BASIC METABOLIC PANEL
Anion gap: 8 (ref 5–15)
BUN: 9 mg/dL (ref 6–20)
CO2: 26 mmol/L (ref 22–32)
Calcium: 8.9 mg/dL (ref 8.9–10.3)
Chloride: 102 mmol/L (ref 98–111)
Creatinine, Ser: 0.93 mg/dL (ref 0.44–1.00)
GFR, Estimated: >60 mL/min (ref >60)
Glucose, Bld: 105 mg/dL — ABNORMAL HIGH (ref 70–99)
Potassium: 3.8 mmol/L (ref 3.5–5.1)
Sodium: 136 mmol/L (ref 135–145)

## 2021-10-22 LAB — CBC WITH DIFFERENTIAL/PLATELET
Abs Immature Granulocytes: 0.04 10*3/uL (ref 0.00–0.07)
Basophils Absolute: 0 10*3/uL (ref 0.0–0.1)
Basophils Relative: 0 %
Eosinophils Absolute: 0.2 10*3/uL (ref 0.0–0.5)
Eosinophils Relative: 2 %
HCT: 36.9 % (ref 36.0–46.0)
Hemoglobin: 12.3 g/dL (ref 12.0–15.0)
Immature Granulocytes: 0 %
Lymphocytes Relative: 21 %
Lymphs Abs: 2.3 10*3/uL (ref 0.7–4.0)
MCH: 27.6 pg (ref 26.0–34.0)
MCHC: 33.3 g/dL (ref 30.0–36.0)
MCV: 82.9 fL (ref 80.0–100.0)
Monocytes Absolute: 0.6 10*3/uL (ref 0.1–1.0)
Monocytes Relative: 6 %
Neutro Abs: 7.5 10*3/uL (ref 1.7–7.7)
Neutrophils Relative %: 71 %
Platelets: 276 10*3/uL (ref 150–400)
RBC: 4.45 MIL/uL (ref 3.87–5.11)
RDW: 12.4 % (ref 11.5–15.5)
WBC: 10.7 10*3/uL — ABNORMAL HIGH (ref 4.0–10.5)
nRBC: 0.3 % — ABNORMAL HIGH (ref 0.0–0.2)

## 2021-10-22 LAB — I-STAT BETA HCG BLOOD, ED (MC, WL, AP ONLY): I-stat hCG, quantitative: <5 m[IU]/mL (ref <5)

## 2021-10-22 LAB — RESP PANEL BY RT-PCR (FLU A&B, COVID) ARPGX2
Influenza A by PCR: NEGATIVE
Influenza B by PCR: NEGATIVE
SARS Coronavirus 2 by RT PCR: NEGATIVE

## 2021-10-22 MED ORDER — BENZONATATE 100 MG PO CAPS
100.0000 mg | ORAL_CAPSULE | Freq: Once | ORAL | Status: AC
  Administered 2021-10-22: 100 mg via ORAL
  Filled 2021-10-22: qty 1

## 2021-10-22 NOTE — ED Provider Notes (Signed)
[place] EMERGENCY DEPARTMENT Provider Note  CSN: [id number] Arrival date & time: 10/22/21 0133  Chief Complaint(s) Lightheaded/Dizzy/Cough  HPI Kathryn Suarez is a 35 y.o. female   The history is provided by the patient.  URI Presenting symptoms: congestion, cough, fatigue, rhinorrhea and sore throat   Presenting symptoms: no fever   Severity:  Moderate Onset quality:  Gradual Duration:  5 days Timing:  Constant Progression:  Worsening Chronicity:  New Relieved by:  Nothing Worsened by:  Nothing Associated symptoms: myalgias and sinus pain   Associated symptoms: no headaches, no neck pain and no wheezing   Associated symptoms comment:  Lightheadedness Risk factors: no diabetes mellitus, no recent illness and no sick contacts    Past Medical History Past Medical History:  Diagnosis Date   Asthma    Chronic pelvic pain in female    Endometriosis of the cul-de-sac    stage 1, biopsy proven with laparoscopy 05/2018   History of anemia    Hypertension    not currently being treated at this time- weight loss    Kidney stones    Ovarian torsion 2020   h/o LSO   Sickle cell trait ([place])    Patient Active Problem List   Diagnosis Date Noted   Ovarian cyst 07/16/2021   Sickle cell trait ([place]) 12/01/2020   Endometriosis of the cul-de-sac    Morbid obesity ([place]) 11/30/2017   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 Authorizing Provider  cetirizine (ZYRTEC) 10 MG tablet SMARTSIG:1 Tablet(s) By Mouth Every Evening PRN 04/03/21   [provider]  ELDERBERRY PO Take by mouth.    [provider]  ibuprofen (ADVIL) 800 MG tablet Take 1 tablet (800 mg total) by mouth every 8 (eight) hours as needed. 01/04/20   Woodroe Mode, MD  medroxyPROGESTERone (PROVERA) 10 MG  tablet Take 1 tablet (10 mg total) by mouth daily. 06/11/21   Truett Mainland, DO  Melatonin 1 MG CAPS Take by mouth.    [provider]  ondansetron (ZOFRAN ODT) 4 MG disintegrating tablet Take 1 tablet (4 mg total) by mouth every 8 (eight) hours as needed for nausea or vomiting. 07/02/21   Maudie Flakes, MD  oxyCODONE-acetaminophen (PERCOCET/ROXICET) 5-325 MG tablet Take 3 tablets by mouth every 6 (six) hours as needed for severe pain (gets this at the pain clinic).    [provider]  polyethylene glycol (MIRALAX) 17 g packet Take 17 g by mouth daily. 03/22/20   Woodroe Mode, MD  topiramate (TOPAMAX) 50 MG tablet  01/30/21   [provider]  triamcinolone cream (KENALOG) 0.5 % Apply topically 2 (two) times daily as needed. 04/03/21   [provider]  Past Surgical History Past Surgical History:  Procedure Laterality Date   DILATION AND CURETTAGE OF UTERUS  03/23/2007   EXCISION OF TONGUE LESION     LAPAROSCOPIC OOPHERECTOMY Left 08/2019   in [place] 06/01/2018   Procedure: LAPAROSCOPY DIAGNOSTIC, PERITONEAL BIOPSY;  Surgeon: Emily Filbert, MD;  Location: [place];  Service: Gynecology;  Laterality: N/A;   Family History Family History  Problem Relation Age of Onset   Hypertension Mother    Sickle cell trait Father     Social History Social History   Tobacco Use   Smoking status: Never   Smokeless tobacco: Never  Vaping Use   Vaping Use: Never used  Substance Use Topics   Alcohol use: No    Comment: occas   Drug use: No   Allergies Latex  Review of Systems Review of Systems  Constitutional:  Positive for fatigue. Negative for fever.  HENT:  Positive for congestion, rhinorrhea, sinus pain and sore throat.   Respiratory:  Positive for cough. Negative for wheezing.    Musculoskeletal:  Positive for myalgias. Negative for neck pain.  Neurological:  Negative for headaches.  All other systems are reviewed and are negative for acute change except as noted in the HPI  Physical Exam Vital Signs  I have reviewed the triage vital signs BP 126/89   Pulse 85   Temp 98.9 F (37.2 C) (Oral)   Resp 18   SpO2 100%   Physical Exam Vitals reviewed.  Constitutional:      General: She is not in acute distress.    Appearance: She is well-developed. She is not diaphoretic.  HENT:     Head: Normocephalic and atraumatic.     Nose: Nose normal.     Mouth/Throat:     Mouth: No oral lesions or angioedema.     Pharynx: No pharyngeal swelling, posterior oropharyngeal erythema or uvula swelling.     Tonsils: No tonsillar exudate or tonsillar abscesses.     Comments: Post nasal drip  Eyes:     General: No scleral icterus.       Right eye: No discharge.        Left eye: No discharge.     Conjunctiva/sclera: Conjunctivae normal.     Pupils: Pupils are equal, round, and reactive to light.  Cardiovascular:     Rate and Rhythm: Normal rate and regular rhythm.     Heart sounds: No murmur heard.   No friction rub. No gallop.  Pulmonary:     Effort: Pulmonary effort is normal. No respiratory distress.     Breath sounds: Normal breath sounds. No stridor. No rales.  Abdominal:     General: There is no distension.     Palpations: Abdomen is soft.     Tenderness: There is no abdominal tenderness.  Musculoskeletal:        General: No tenderness.     Cervical back: Normal range of motion and neck supple.  Skin:    General: Skin is warm and dry.     Findings: No erythema or rash.  Neurological:     Mental Status: She is alert and oriented to person, place, and time.    ED Results and Treatments Labs (all labs ordered are listed, but only abnormal results are displayed) Labs Reviewed  CBC WITH DIFFERENTIAL/PLATELET - Abnormal; Notable for the following components:       Result Value   WBC 10.7 (*)    nRBC 0.3 (*)  All other components within normal limits  BASIC METABOLIC PANEL - Abnormal; Notable for the following components:   Glucose, Bld 105 (*)    All other components within normal limits  RESP PANEL BY RT-PCR (FLU A&B, COVID) ARPGX2  I-STAT BETA HCG BLOOD, ED ([facility], [facility], [facility] ONLY)                                                                                                                         EKG  EKG Interpretation  Date/Time:    Ventricular Rate:    PR Interval:    QRS Duration:   QT Interval:    QTC Calculation:   R Axis:     Text Interpretation:         Radiology DG Chest 2 View  Result Date: 10/22/2021 CLINICAL DATA:  Cough. EXAM: CHEST - 2 VIEW COMPARISON:  Chest radiograph dated 02/05/2015 FINDINGS: The heart size and mediastinal contours are within normal limits. Both lungs are clear. The visualized skeletal structures are unremarkable. IMPRESSION: No active cardiopulmonary disease. Electronically Signed   By: Anner Crete M.D.   On: 10/22/2021 02:07    Pertinent labs & imaging results that were available during my care of the patient were reviewed by me and considered in my medical decision making (see MDM for details).  Medications Ordered in ED Medications  benzonatate (TESSALON) capsule 100 mg (100 mg Oral Given 10/22/21 0543)                                                                                                                                     Procedures Procedures  (including critical care time)  Medical Decision Making / ED Course I have reviewed the nursing notes for this encounter and the patient's prior records (if available in EHR or on provided paperwork).  Kathryn Suarez was evaluated in Emergency Department on 10/22/2021 for the symptoms described in the history of present illness. She was evaluated in the context of the global COVID-19 pandemic, which necessitated consideration that  the patient might be at risk for infection with the SARS-CoV-2 virus that causes COVID-19. Institutional protocols and algorithms that pertain to the evaluation of patients at risk for COVID-19 are in a state of rapid change based on information released by regulatory bodies including the CDC and federal and state organizations. These policies and algorithms were followed during the patient's care  in the ED.     Patient presents with viral symptoms for 5 days. adequate oral hydration. Rest of history as above.  Patient appears well. No signs of toxicity, patient is interactive. No hypoxia, tachypnea or other signs of respiratory distress. No sign of clinical dehydration. Lung exam clear. Rest of exam as above.  Mild leukocytosis. No anemia. No significant electrolyte derangements or renal sufficiency. Chest x-ray w/o PNA COVID/influenza neg  Most consistent with viral illness   No evidence suggestive of pharyngitis, AOM, PNA.  Discussed symptomatic treatment with the patient and they will follow closely with their PCP.   Pertinent labs & imaging results that were available during my care of the patient were reviewed by me and considered in my medical decision making:    Final Clinical Impression(s) / ED Diagnoses Final diagnoses:  Acute nasopharyngitis   The patient appears reasonably screened and/or stabilized for discharge and I doubt any other medical condition or other [facility] requiring further screening, evaluation, or treatment in the ED at this time prior to discharge. Safe for discharge with strict return precautions.  Disposition: Discharge  Condition: Good  I have discussed the results, Dx and Tx plan with the patient/family who expressed understanding and agree(s) with the plan. Discharge instructions discussed at length. The patient/family was given strict return precautions who verbalized understanding of the instructions. No further questions at time of discharge.    ED  Discharge Orders     None        Follow Up: [place], [address] [place] [address] [phone]  Call  to schedule an appointment for close follow up     This chart was dictated using voice recognition software.  Despite best efforts to proofread,  errors can occur which can change the documentation meaning.    Fatima Blank, MD 10/22/21 [phone]

## 2021-10-22 NOTE — ED Notes (Signed)
This RN encouraging oral hydration. Patient states "I cant. Something is wrong with my throat. I can't swallow" Patient is noted to be swallowing own secretions without visible difficulty. Patient encouraged to continue to try to hydrate orally. Provider made aware

## 2021-10-22 NOTE — ED Triage Notes (Signed)
Patient reports feeling dizzy/lightheaded ;fatigue and occasional dry cough onset this week .

## 2021-10-22 NOTE — Discharge Instructions (Addendum)
You may take over-the-counter medicine for symptomatic relief, such as Tylenol, Motrin, TheraFlu, Alka seltzer , black elderberry, etc. Please limit acetaminophen (Tylenol) to 4000 mg and Ibuprofen (Motrin, Advil, etc.) to 2400 mg for a 24hr period. Please note that other over-the-counter medicine may contain acetaminophen or ibuprofen as a component of their ingredients.   

## 2021-10-27 ENCOUNTER — Ambulatory Visit: Payer: Medicaid Other | Admitting: Obstetrics and Gynecology

## 2021-11-22 ENCOUNTER — Emergency Department (HOSPITAL_BASED_OUTPATIENT_CLINIC_OR_DEPARTMENT_OTHER)
Admission: EM | Admit: 2021-11-22 | Discharge: 2021-11-22 | Disposition: A | Discharge status: Home / Self Care | Payer: Medicaid Other | Attending: Emergency Medicine | Admitting: Emergency Medicine

## 2021-11-22 ENCOUNTER — Encounter (HOSPITAL_BASED_OUTPATIENT_CLINIC_OR_DEPARTMENT_OTHER): Payer: Self-pay

## 2021-11-22 ENCOUNTER — Emergency Department (HOSPITAL_BASED_OUTPATIENT_CLINIC_OR_DEPARTMENT_OTHER): Payer: Medicaid Other

## 2021-11-22 ENCOUNTER — Other Ambulatory Visit: Payer: Self-pay

## 2021-11-22 DIAGNOSIS — Z79899 Other long term (current) drug therapy: Secondary | ICD-10-CM | POA: Insufficient documentation

## 2021-11-22 DIAGNOSIS — R1084 Generalized abdominal pain: Secondary | ICD-10-CM | POA: Diagnosis present

## 2021-11-22 DIAGNOSIS — K5900 Constipation, unspecified: Secondary | ICD-10-CM | POA: Insufficient documentation

## 2021-11-22 DIAGNOSIS — G8929 Other chronic pain: Secondary | ICD-10-CM | POA: Diagnosis not present

## 2021-11-22 DIAGNOSIS — I10 Essential (primary) hypertension: Secondary | ICD-10-CM | POA: Insufficient documentation

## 2021-11-22 DIAGNOSIS — N939 Abnormal uterine and vaginal bleeding, unspecified: Secondary | ICD-10-CM | POA: Insufficient documentation

## 2021-11-22 DIAGNOSIS — J45909 Unspecified asthma, uncomplicated: Secondary | ICD-10-CM | POA: Insufficient documentation

## 2021-11-22 LAB — CBC
HCT: 36 % (ref 36.0–46.0)
Hemoglobin: 12 g/dL (ref 12.0–15.0)
MCH: 27.5 pg (ref 26.0–34.0)
MCHC: 33.3 g/dL (ref 30.0–36.0)
MCV: 82.4 fL (ref 80.0–100.0)
Platelets: 255 10*3/uL (ref 150–400)
RBC: 4.37 MIL/uL (ref 3.87–5.11)
RDW: 12.6 % (ref 11.5–15.5)
WBC: 8.8 10*3/uL (ref 4.0–10.5)
nRBC: 0 % (ref 0.0–0.2)

## 2021-11-22 LAB — URINALYSIS, ROUTINE W REFLEX MICROSCOPIC
Bilirubin Urine: NEGATIVE
Glucose, UA: NEGATIVE mg/dL
Hgb urine dipstick: NEGATIVE
Ketones, ur: NEGATIVE mg/dL
Leukocytes,Ua: NEGATIVE
Nitrite: NEGATIVE
Protein, ur: NEGATIVE mg/dL
Specific Gravity, Urine: 1.005 (ref 1.005–1.030)
pH: 6 (ref 5.0–8.0)

## 2021-11-22 LAB — COMPREHENSIVE METABOLIC PANEL
ALT: 8 U/L (ref 0–44)
AST: 13 U/L — ABNORMAL LOW (ref 15–41)
Albumin: 4 g/dL (ref 3.5–5.0)
Alkaline Phosphatase: 61 U/L (ref 38–126)
Anion gap: 7 (ref 5–15)
BUN: 11 mg/dL (ref 6–20)
CO2: 28 mmol/L (ref 22–32)
Calcium: 9.2 mg/dL (ref 8.9–10.3)
Chloride: 103 mmol/L (ref 98–111)
Creatinine, Ser: 0.89 mg/dL (ref 0.44–1.00)
GFR, Estimated: >60 mL/min (ref >60)
Glucose, Bld: 111 mg/dL — ABNORMAL HIGH (ref 70–99)
Potassium: 3.6 mmol/L (ref 3.5–5.1)
Sodium: 138 mmol/L (ref 135–145)
Total Bilirubin: 0.4 mg/dL (ref 0.3–1.2)
Total Protein: 7.1 g/dL (ref 6.5–8.1)

## 2021-11-22 LAB — LIPASE, BLOOD: Lipase: 44 U/L (ref 11–51)

## 2021-11-22 LAB — PREGNANCY, URINE: Preg Test, Ur: NEGATIVE

## 2021-11-22 MED ORDER — IOHEXOL 300 MG/ML  SOLN
100.0000 mL | Freq: Once | INTRAMUSCULAR | Status: AC | PRN
  Administered 2021-11-22: 100 mL via INTRAVENOUS

## 2021-11-22 NOTE — ED Triage Notes (Signed)
Pt presents with LLQ pain and lower back pain x1 day, now feeling dizzy. Pt seen at [facility] they sent her to ED for CT of her abd  Pt received Tylenol and Toradol at [facility]

## 2021-11-22 NOTE — ED Provider Notes (Signed)
[facility] EMERGENCY DEPT Provider Note   CSN: [id number] Arrival date & time: 11/22/21  1443     History Chief Complaint  Patient presents with   Abdominal Pain    Kathryn Suarez is a 35 y.o. female.  Patient to ED with abdominal pain, sent from Urgent Care where she was seen earlier today for CT evaluation. She reports a history of same symptoms that have been recurrent for "a long time". She reports history of endometriosis and TOA resulting in left oophorectomy 3 years ago. No fever. No vomiting. She reports no bowel movement in several days. No vaginal discharge. She reports irregular vaginal bleeding that is typical for her and has resulted in need for transfusion in the past, however, states she had her hemoglobin checked 4 days ago and was normal.   The history is provided by the patient. No language interpreter was used.  Abdominal Pain Associated symptoms: constipation and vaginal bleeding   Associated symptoms: no chest pain, no dysuria, no fever, no shortness of breath, no vaginal discharge and no vomiting       Past Medical History:  Diagnosis Date   Asthma    Chronic pelvic pain in female    Endometriosis of the cul-de-sac    stage 1, biopsy proven with laparoscopy 05/2018   History of anemia    Hypertension    not currently being treated at this time- weight loss    Kidney stones    Ovarian torsion 2020   h/o LSO   Sickle cell trait [facility])     Patient Active Problem List   Diagnosis Date Noted   Ovarian cyst 07/16/2021   Sickle cell trait ([place]) 12/01/2020   Endometriosis of the cul-de-sac    Morbid obesity ([place]) 11/30/2017   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   Chronic pelvic pain in female 10/02/2011    Past Surgical History:  Procedure Laterality Date   DILATION AND CURETTAGE OF UTERUS  03/23/2007   EXCISION OF TONGUE LESION     LAPAROSCOPIC OOPHERECTOMY Left 08/2019   in [place] 06/01/2018   Procedure: LAPAROSCOPY DIAGNOSTIC, PERITONEAL BIOPSY;  Surgeon: Emily Filbert, MD;  Location: [place];  Service: Gynecology;  Laterality: N/A;     OB History     Gravida  1   Para  0   Term  0   Preterm  0   AB  1   Living  0      SAB  1   IAB  0   Ectopic  0   Multiple  0   Live Births  0           Family History  Problem Relation Age of Onset   Hypertension Mother    Sickle cell trait Father     Social History   Tobacco Use   Smoking status: Never   Smokeless tobacco: Never  Vaping Use   Vaping Use: Never used  Substance Use Topics   Alcohol use: No    Comment: occas   Drug use: No    Home Medications Prior to Admission medications   Medication Sig Start Date End Date Taking? Authorizing Provider  cetirizine (ZYRTEC) 10 MG tablet SMARTSIG:1 Tablet(s) By Mouth Every Evening PRN 04/03/21   [provider]  ELDERBERRY PO Take by mouth.    [provider]  ibuprofen (ADVIL) 800 MG tablet Take 1 tablet (800 mg total) by mouth  every 8 (eight) hours as needed. 01/04/20   Woodroe Mode, MD  medroxyPROGESTERone (PROVERA) 10 MG tablet Take 1 tablet (10 mg total) by mouth daily. 06/11/21   Truett Mainland, DO  Melatonin 1 MG CAPS Take by mouth.    [provider]  ondansetron (ZOFRAN ODT) 4 MG disintegrating tablet Take 1 tablet (4 mg total) by mouth every 8 (eight) hours as needed for nausea or vomiting. 07/02/21   Maudie Flakes, MD  oxyCODONE-acetaminophen (PERCOCET/ROXICET) 5-325 MG tablet Take 3 tablets by mouth every 6 (six) hours as needed for severe pain (gets this at the pain clinic).    [provider]  polyethylene glycol (MIRALAX) 17 g packet Take 17 g by mouth daily. 03/22/20   Woodroe Mode, MD  topiramate (TOPAMAX) 50 MG tablet  01/30/21   [provider]  triamcinolone cream (KENALOG) 0.5 % Apply topically 2 (two) times daily as needed. 04/03/21   [provider]    Allergies    Latex  Review of Systems   Review of Systems  Constitutional:  Negative for fever.  Respiratory:  Negative for shortness of breath.   Cardiovascular:  Negative for chest pain.  Gastrointestinal:  Positive for abdominal pain and constipation. Negative for vomiting.  Genitourinary:  Positive for pelvic pain and vaginal bleeding. Negative for dysuria and vaginal discharge.  Musculoskeletal:  Positive for back pain.   Physical Exam Updated Vital Signs BP (!) 121/92 (BP Location: Left Arm)   Pulse 81   Temp 98.7 F (37.1 C)   Resp 18   SpO2 100%   Physical Exam Vitals and nursing note reviewed.  Constitutional:      Appearance: She is well-developed.     Comments: Appears uncomfortable.   Abdominal:     General: There is no distension.     Palpations: Abdomen is soft.     Tenderness: There is abdominal tenderness (Diffuse tenderness to soft abdomen.). There is no right CVA tenderness or left CVA tenderness.  Neurological:     Mental Status: She is alert.    ED Results / Procedures / Treatments   Labs (all labs ordered are listed, but only abnormal results are displayed) Labs Reviewed  COMPREHENSIVE METABOLIC PANEL - Abnormal; Notable for the following components:      Result Value   Glucose, Bld 111 (*)    AST 13 (*)    All other components within normal limits  URINALYSIS, ROUTINE W REFLEX MICROSCOPIC - Abnormal; Notable for the following components:   Color, Urine COLORLESS (*)    All other components within normal limits  LIPASE, BLOOD  CBC  PREGNANCY, URINE    EKG None  Radiology No results found.  Procedures Procedures   Medications Ordered in ED Medications  iohexol (OMNIPAQUE) 300 MG/ML solution 100 mL (100 mLs Intravenous Contrast Given 11/22/21 1703)    ED Course  I have reviewed the triage vital signs and the nursing notes.  Pertinent labs & imaging results that were available during my care of the patient were  reviewed by me and considered in my medical decision making (see chart for details).    MDM Rules/Calculators/A&P                           Patient to ED with abdominal/pelvic pain off and on for "a long time", history of TOA, h/o endometriosis. No fever, vomiting.   CT scan performed for evaluation of  symptoms more intense than usual. CT negative for acute findings. Labs unremarkable. She has an [place] scheduled by GYN for 12/12. She is encouraged to keep this appointment.   Database reviewed. She receives 120 oxycodone monthly, last refill on 11/27. Encouraged to add ibuprofen for additional comfort and follow up with GYN as planned.   Final Clinical Impression(s) / ED Diagnoses Final diagnoses:  None   Chronic abdominal pain  Rx / DC Orders ED Discharge Orders     None        Charlann Lange, PA-C 11/22/21 1759    Hayden Rasmussen, MD 11/23/21 1015

## 2021-11-22 NOTE — Discharge Instructions (Signed)
Keep your scheduled appointment for ultrasound on 12/12. Continue your regular pain medication and add ibuprofen 600 mg every 6 hours to see if this provides additional relief.   Return to the ED with any worsening symptoms - high fever, severe pain, uncontrolled vomiting or new concern.

## 2021-11-24 ENCOUNTER — Other Ambulatory Visit: Payer: Self-pay

## 2021-11-24 ENCOUNTER — Ambulatory Visit
Admission: RE | Admit: 2021-11-24 | Discharge: 2021-11-24 | Disposition: A | Discharge status: Home / Self Care | Payer: Medicaid Other | Source: Ambulatory Visit | Attending: Obstetrics and Gynecology | Admitting: Obstetrics and Gynecology

## 2021-11-24 ENCOUNTER — Other Ambulatory Visit: Payer: Self-pay | Admitting: Obstetrics and Gynecology

## 2021-11-24 DIAGNOSIS — N83201 Unspecified ovarian cyst, right side: Secondary | ICD-10-CM

## 2021-12-25 ENCOUNTER — Encounter: Payer: Self-pay | Admitting: Family Medicine

## 2021-12-30 ENCOUNTER — Encounter: Payer: Self-pay | Admitting: Lactation Services

## 2022-01-05 ENCOUNTER — Ambulatory Visit (INDEPENDENT_AMBULATORY_CARE_PROVIDER_SITE_OTHER): Payer: Medicaid Other | Admitting: Family Medicine

## 2022-01-05 ENCOUNTER — Other Ambulatory Visit: Payer: Self-pay

## 2022-01-05 ENCOUNTER — Encounter: Payer: Self-pay | Admitting: Family Medicine

## 2022-01-05 VITALS — BP 120/92 | HR 109 | Wt 197.0 lb

## 2022-01-05 DIAGNOSIS — E221 Hyperprolactinemia: Secondary | ICD-10-CM

## 2022-01-05 DIAGNOSIS — R519 Headache, unspecified: Secondary | ICD-10-CM

## 2022-01-05 DIAGNOSIS — N809 Endometriosis, unspecified: Secondary | ICD-10-CM

## 2022-01-05 DIAGNOSIS — N643 Galactorrhea not associated with childbirth: Secondary | ICD-10-CM

## 2022-01-05 DIAGNOSIS — G8929 Other chronic pain: Secondary | ICD-10-CM

## 2022-01-05 NOTE — Progress Notes (Signed)
GYNECOLOGY OFFICE VISIT NOTE  History:   Kathryn Suarez is a 36 y.o. G1P0010 here today for a follow up evaluation of galactorrhea.   Started consistently around 2018, but does feel like its been getting worse over the past few months and bothersome to her. Both breasts. Clear to milky in color. Left breast recently multi-ductal (about 3), right usually one duct. Spontaneous mainly on the left and can get her shirt wet. Breast pain when spontaneous drainage, but not when expressed. Drainage from the right only when expressed. Drainage occurring almost every day, but seems to be more whenever she is on her cycle. No FH or personal history of breast cancer.   She is been evaluated several times in the past for this. Prolactin and TSH have been normal. B/L mammogram and left breast US WNL last year.   She also endorses an approximate 2 or so year history of headaches. Frontal usually and can last for several hours. She is having headaches about 3 times weekly. This has been an increase in frequency over the past couple of months. Associated with lightheadedness. She also feels like her vision "has been getting bad" recently.   She also has not heard from the [place] [facility] referral for her endometriosis and would like this checked into. Uses IUD and provera.     Past Medical History:  Diagnosis Date   Asthma    Chronic pelvic pain in female    Endometriosis of the cul-de-sac    stage 1, biopsy proven with laparoscopy 05/2018   History of anemia    Hypertension    not currently being treated at this time- weight loss    Kidney stones    Ovarian torsion 2020   h/o LSO   Sickle cell trait [facility])     Past Surgical History:  Procedure Laterality Date   DILATION AND CURETTAGE OF UTERUS  03/23/2007   EXCISION OF TONGUE LESION     LAPAROSCOPIC OOPHERECTOMY Left 08/2019   in [place] 06/01/2018   Procedure: LAPAROSCOPY DIAGNOSTIC, PERITONEAL BIOPSY;  Surgeon: Emily Filbert,  MD;  Location: [place];  Service: Gynecology;  Laterality: N/A;    The following portions of the patient's history were reviewed and updated as appropriate: allergies, current medications, past family history, past medical history, past social history, past surgical history and problem list.    Review of Systems:  Pertinent items noted in HPI and remainder of comprehensive ROS otherwise negative.  Physical Exam:  BP (!) 120/92    Pulse (!) 109    Wt 197 lb (89.4 kg)    BMI 34.90 kg/m  CONSTITUTIONAL: Well-developed, well-nourished female in no acute distress.  HEENT:  Normocephalic, atraumatic. External right and left ear normal. No scleral icterus.  NECK: Normal range of motion, supple, no masses noted on observation SKIN: No rash noted. Not diaphoretic. No erythema. No pallor. Breasts: right breast normal without mass, skin or nipple changes or axillary nodes, left breast normal an approximate 1/2cm nodule around the 2 o'clock position about 3-4 cm from the nipple, skin or nipple changes or axillary nodes. 3 locations of expressed milky nipple discharge from the left, one area from the right.   NEUROLOGIC: Alert and oriented to person, place, and time. Peripheral vision decreased bilaterally (to about 60 degrees b/l). EOMI. Normal speech. Symmetrical smile. Sensation to light touch intact throughout bilaterally. 5/5 upper and lower strength. Normal gait.  PSYCHIATRIC: Normal mood and affect. Normal  behavior. Normal judgment and thought content. CARDIOVASCULAR: Normal heart rate noted RESPIRATORY: Effort normal, no problems with respiration noted PELVIC: Deferred  Labs and Imaging No results found.    Assessment and Plan:   1. Galactorrhea Benign in description. However frequent and bothersome with associated breast pain. Will repeat labs and see further concern as below. Breast exam unchanged from previous documented (palpable mass ~ 1/2cm in 2 o'clock position of L  breast). Will also referral to the breast clinic for additional reassurance.  - Prolactin - TSH - MRI as below  - Breast clinic referral   2. Hyperprolactinemia (HCC) Fasting prolactin returned elevated, may be contributing to the above concern. In association with chronic worsening headaches and possible bi-temporal hemianopsia, ?pituitary macroadenoma. Recommended a brain MRI for further evaluation.   - MR BRAIN W WO CONTRAST; Future  3. Chronic nonintractable headache, unspecified headache type - MR BRAIN W WO CONTRAST; Future  4. Endometriosis Laparoscopic confirmed. IUD in place. Previous trial of [place] but dc'd due to side effects. Referred to [facility] clinic in 05/2021, but never went through. Will replace.    F/u pending results.   Darrelyn Hillock, DO  OB Fellow, [place] for [place] 01/07/2022 4:25 PM

## 2022-01-05 NOTE — Progress Notes (Signed)
Patient reports pain in both breast. This has been an ongoing problem for years. She also stated that both breast has been having clear/white discharge"

## 2022-01-06 LAB — TSH: TSH: 1.97 u[IU]/mL (ref 0.450–4.500)

## 2022-01-06 LAB — PROLACTIN: Prolactin: 27.7 ng/mL — ABNORMAL HIGH (ref 4.8–23.3)

## 2022-01-07 ENCOUNTER — Encounter: Payer: Self-pay | Admitting: Family Medicine

## 2022-01-19 ENCOUNTER — Telehealth: Payer: Self-pay

## 2022-01-19 NOTE — Telephone Encounter (Addendum)
-----   Message from Patriciaann Clan, DO sent at 01/18/2022  7:53 PM EST ----- Regarding: FW: Referral/MRI Hi all-  Just following up on this to get her MRI brain scheduled and check on the referral.   Thank you!  Dr Higinio Plan  ----- Message ----- From: Patriciaann Clan, DO Sent: 01/07/2022   4:32 PM EST To: [facility] Clinical Pool Subject: Referral/MRI                                   Hi all,   This patient had a referral to the [facility] for endometriosis placed in 05/2021 by Dr. Nehemiah Settle, however the patient never heard back. Is there a way we can re-open this or do I need to replace it? She would still like to go see them.   Also, I've placed an order for a brain MRI for her. Just let me know if there is extra things I need to do for this to occur.   Thank you!  Dr Higinio Plan

## 2022-01-19 NOTE — Telephone Encounter (Signed)
Called [facility]. Admin staff unable to see record of patient. Call transferred to referral coordinator; VM left requesting call back.   MRI scheduled for 2/27 @ 3PM; pt arrive at main [place] [facility] at 2:30. Nathaneil Canary notified pt will need PA completed. Per centralized scheduling, pt will need negative UPT documented prior to MRI. Pt scheduled for nurse visit for UPT 02/09/22 at 1330. MyChart message sent to patient with partial update.

## 2022-01-27 ENCOUNTER — Ambulatory Visit: Payer: Medicaid Other | Admitting: Nurse Practitioner

## 2022-02-04 ENCOUNTER — Telehealth: Payer: Self-pay

## 2022-02-04 NOTE — Telephone Encounter (Addendum)
-----   Message from [name], RN sent at 01/09/2022 12:08 PM EST ----- Regarding: FW: Referral/MRI  ----- Message ----- From: Patriciaann Clan, DO Sent: 01/07/2022   4:32 PM EST To: [facility] Clinical Pool Subject: Referral/MRI                                   Hi all,   This patient had a referral to the [facility] for endometriosis placed in 05/2021 by Dr. Nehemiah Settle, however the patient never heard back. Is there a way we can re-open this or do I need to replace it? She would still like to go see them.   Also, I've placed an order for a brain MRI for her. Just let me know if there is extra things I need to do for this to occur.   Thank you!  Dr Loanne Drilling [facility] @ [phone], fax # [phone].  Pt scheduled for March 20th @ 0915.  Pt provided with office address information [address].  Pt advised to call them with any questions or concerns.  Pt also reminded about her MRI appt.  I verified with pt if she is claustrophobic pt reports that she is.  Per Dione Plover, MD pt can have Ativan 1 mg tablet take 20 minutes prior to MRI appointment.  Pt informed that there is a prescription sent to her [place] that she will need to take prior to appt.  Pt verbalized understanding with no further questions.   Frances Nickels  02/04/22

## 2022-02-04 NOTE — Telephone Encounter (Incomplete Revision)
-----   Message from [name], RN sent at 01/09/2022 12:08 PM EST ----- Regarding: FW: Referral/MRI  ----- Message ----- From: Patriciaann Clan, DO Sent: 01/07/2022   4:32 PM EST To: [facility] Clinical Pool Subject: Referral/MRI                                   Hi all,   This patient had a referral to the [facility] for endometriosis placed in 05/2021 by Dr. Nehemiah Settle, however the patient never heard back. Is there a way we can re-open this or do I need to replace it? She would still like to go see them.   Also, I've placed an order for a brain MRI for her. Just let me know if there is extra things I need to do for this to occur.   Thank you!  Dr Loanne Drilling [facility] @ [phone], fax # [phone].  Pt scheduled for March 20th @ 0915.  Pt provided with office address information [address] [facility] [facility] [address].

## 2022-02-05 MED ORDER — LORAZEPAM 1 MG PO TABS: 1.0000 mg | ORAL_TABLET | Freq: Once | ORAL | 0 refills | Status: AC

## 2022-02-09 ENCOUNTER — Ambulatory Visit (HOSPITAL_COMMUNITY)
Admission: RE | Admit: 2022-02-09 | Discharge: 2022-02-09 | Disposition: A | Discharge status: Home / Self Care | Payer: Medicaid Other | Source: Ambulatory Visit | Attending: Family Medicine | Admitting: Family Medicine

## 2022-02-09 ENCOUNTER — Other Ambulatory Visit: Payer: Self-pay

## 2022-02-09 ENCOUNTER — Ambulatory Visit (INDEPENDENT_AMBULATORY_CARE_PROVIDER_SITE_OTHER): Payer: Medicaid Other

## 2022-02-09 DIAGNOSIS — Z3202 Encounter for pregnancy test, result negative: Secondary | ICD-10-CM | POA: Diagnosis not present

## 2022-02-09 DIAGNOSIS — R519 Headache, unspecified: Secondary | ICD-10-CM | POA: Insufficient documentation

## 2022-02-09 DIAGNOSIS — Z3200 Encounter for pregnancy test, result unknown: Secondary | ICD-10-CM

## 2022-02-09 DIAGNOSIS — G8929 Other chronic pain: Secondary | ICD-10-CM | POA: Insufficient documentation

## 2022-02-09 DIAGNOSIS — E221 Hyperprolactinemia: Secondary | ICD-10-CM | POA: Diagnosis not present

## 2022-02-09 MED ORDER — LORAZEPAM 1 MG PO TABS: 1.0000 mg | ORAL_TABLET | Freq: Once | ORAL | 0 refills | Status: AC

## 2022-02-09 MED ORDER — GADOBUTROL 1 MMOL/ML IV SOLN
8.5000 mL | Freq: Once | INTRAVENOUS | Status: AC | PRN
  Administered 2022-02-09: 8.5 mL via INTRAVENOUS

## 2022-02-09 NOTE — Progress Notes (Signed)
Pt called in before MRI this afternoon requesting something for anxiety due to claustrophobia. Dr Rip Harbour gave 1 mg Ativan Rx for 1 dose to take 30 mins to 1 hr before appt. Pt verbalized understanding.  Colletta Maryland, RN

## 2022-02-09 NOTE — Progress Notes (Signed)
Here for UPT prior to MRI exam this afternoon. UPT is negative. Patient given printed prescription for single Ativan tablet to take prior to MRI.   Apolonio Schneiders RN 02/09/22

## 2022-02-10 LAB — POCT PREGNANCY, URINE: Preg Test, Ur: NEGATIVE

## 2022-02-13 ENCOUNTER — Other Ambulatory Visit: Payer: Self-pay | Admitting: Family Medicine

## 2022-02-13 DIAGNOSIS — N643 Galactorrhea not associated with childbirth: Secondary | ICD-10-CM

## 2022-03-02 NOTE — Addendum Note (Signed)
Addended by: Annabell Howells on: 03/02/2022 01:34 PM ? ? Modules accepted: Orders ? ?

## 2022-03-04 ENCOUNTER — Other Ambulatory Visit: Payer: Self-pay | Admitting: Internal Medicine

## 2022-03-05 ENCOUNTER — Telehealth: Payer: Self-pay | Admitting: General Practice

## 2022-03-05 DIAGNOSIS — D369 Benign neoplasm, unspecified site: Secondary | ICD-10-CM

## 2022-03-05 LAB — COMPLETE METABOLIC PANEL WITH GFR
AG Ratio: 1.6 (calc) (ref 1.0–2.5)
ALT: 12 U/L (ref 6–29)
AST: 14 U/L (ref 10–30)
Albumin: 4.4 g/dL (ref 3.6–5.1)
Alkaline phosphatase (APISO): 76 U/L (ref 31–125)
BUN: 12 mg/dL (ref 7–25)
CO2: 24 mmol/L (ref 20–32)
Calcium: 8.8 mg/dL (ref 8.6–10.2)
Chloride: 103 mmol/L (ref 98–110)
Creat: 0.95 mg/dL (ref 0.50–0.97)
Globulin: 2.7 g/dL (calc) (ref 1.9–3.7)
Glucose, Bld: 45 mg/dL — ABNORMAL LOW (ref 65–99)
Potassium: 3.7 mmol/L (ref 3.5–5.3)
Sodium: 140 mmol/L (ref 135–146)
Total Bilirubin: 0.5 mg/dL (ref 0.2–1.2)
Total Protein: 7.1 g/dL (ref 6.1–8.1)
eGFR: 81 mL/min/{1.73_m2} (ref >=60)

## 2022-03-05 LAB — LIPID PANEL
Cholesterol: 162 mg/dL (ref <200)
HDL: 64 mg/dL (ref >=50)
LDL Cholesterol (Calc): 85 mg/dL (calc)
Non-HDL Cholesterol (Calc): 98 mg/dL (calc) (ref <130)
Total CHOL/HDL Ratio: 2.5 (calc) (ref <5.0)
Triglycerides: 47 mg/dL (ref <150)

## 2022-03-05 LAB — CBC
HCT: 38.6 % (ref 35.0–45.0)
Hemoglobin: 12.6 g/dL (ref 11.7–15.5)
MCH: 27.5 pg (ref 27.0–33.0)
MCHC: 32.6 g/dL (ref 32.0–36.0)
MCV: 84.3 fL (ref 80.0–100.0)
MPV: 10.9 fL (ref 7.5–12.5)
Platelets: 266 10*3/uL (ref 140–400)
RBC: 4.58 10*6/uL (ref 3.80–5.10)
RDW: 12.6 % (ref 11.0–15.0)
WBC: 7 10*3/uL (ref 3.8–10.8)

## 2022-03-05 LAB — VITAMIN D 25 HYDROXY (VIT D DEFICIENCY, FRACTURES): Vit D, 25-Hydroxy: 26 ng/mL — ABNORMAL LOW (ref 30–100)

## 2022-03-05 LAB — TSH: TSH: 1.95 mIU/L

## 2022-03-05 MED ORDER — CABERGOLINE 0.5 MG PO TABS: 0.2500 mg | ORAL_TABLET | ORAL | 1 refills | Status: DC

## 2022-03-05 NOTE — Telephone Encounter (Signed)
Called patient regarding mychart message. Patient states she recently saw the ob/gyn at [facility] but didn't like them and felt more comfortable and taken care of with [place]. She reports increase in abdominal pain and headaches recently. She states she saw Dr Higinio Plan back in January and she mentioned referral to an endocrinologist & a breast specialist. Asked patient if abdominal pain felt similar to endometriosis pain she has had in the past. Patient reports yes. Discussed scheduling her back for a follow up appt. Reviewed chart to see which provider she has seen consistently in the past and patient requests to follow up with Dr Roselie Awkward. Told patient I would have the front office reach out to her with an appt. Patient verbalized understanding. Reviewed patient's chart with Dr Kennon Rounds who states patient needs referral to endocrinology for galactorrhea & microadenoma. Dr Kennon Rounds prescribed cabergoline 0.'25mg'$  twice a week until patient can get in with endocrinology. Reviewed with patient. Discussed Rx would be sent to pharmacy & I would reach out to endocrinology regarding referral. Patient verbalized understanding to all. ?

## 2022-04-26 ENCOUNTER — Encounter (HOSPITAL_BASED_OUTPATIENT_CLINIC_OR_DEPARTMENT_OTHER): Payer: Self-pay

## 2022-04-26 ENCOUNTER — Emergency Department (HOSPITAL_BASED_OUTPATIENT_CLINIC_OR_DEPARTMENT_OTHER)
Admission: EM | Admit: 2022-04-26 | Discharge: 2022-04-26 | Disposition: A | Discharge status: Home / Self Care | Payer: Medicaid Other | Attending: Emergency Medicine | Admitting: Emergency Medicine

## 2022-04-26 ENCOUNTER — Other Ambulatory Visit: Payer: Self-pay

## 2022-04-26 DIAGNOSIS — Z91040 Latex allergy status: Secondary | ICD-10-CM | POA: Diagnosis not present

## 2022-04-26 DIAGNOSIS — Z91010 Allergy to peanuts: Secondary | ICD-10-CM | POA: Diagnosis not present

## 2022-04-26 DIAGNOSIS — R519 Headache, unspecified: Secondary | ICD-10-CM

## 2022-04-26 DIAGNOSIS — J45909 Unspecified asthma, uncomplicated: Secondary | ICD-10-CM | POA: Insufficient documentation

## 2022-04-26 DIAGNOSIS — I10 Essential (primary) hypertension: Secondary | ICD-10-CM | POA: Insufficient documentation

## 2022-04-26 MED ORDER — PROCHLORPERAZINE EDISYLATE 10 MG/2ML IJ SOLN
10.0000 mg | Freq: Once | INTRAMUSCULAR | Status: AC
  Administered 2022-04-26: 10 mg via INTRAMUSCULAR
  Filled 2022-04-26 (×2): qty 2

## 2022-04-26 MED ORDER — KETOROLAC TROMETHAMINE 60 MG/2ML IM SOLN
30.0000 mg | Freq: Once | INTRAMUSCULAR | Status: AC
  Administered 2022-04-26: 30 mg via INTRAMUSCULAR
  Filled 2022-04-26: qty 2

## 2022-04-26 MED ORDER — ONDANSETRON 4 MG PO TBDP
4.0000 mg | ORAL_TABLET | Freq: Once | ORAL | Status: AC
  Administered 2022-04-26: 4 mg via ORAL
  Filled 2022-04-26: qty 1

## 2022-04-26 NOTE — ED Triage Notes (Signed)
Sudden headache that woke her from sleep. Describes blurry vision and photosensitivity. Reports having MRI completed on 2/28 showing small spot and is having her follow up with neurologist soon as she is able to get an appointment.  ?

## 2022-04-26 NOTE — ED Provider Notes (Signed)
?[facility] ?Provider Note ? ?CSN: [id number] ?Arrival date & time: 04/26/22 0336 ? ?Chief Complaint(s) ?Headache ? ?HPI ?Kathryn Suarez is a 36 y.o. 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  This is similar to her prior headaches just more intense.  She endorses blurry vision and photosensitivity.  Denies any nausea or vomiting.  No fevers or chills.  No neck pain or nuchal rigidity.  No fall or trauma.  No hemianopsia. ? ? ? ?Headache ?Headache ? ?Past Medical History ?Past Medical History:  ?Diagnosis Date  ? Asthma   ? Chronic pelvic pain in female   ? Endometriosis of the cul-de-sac   ? stage 1, biopsy proven with laparoscopy 05/2018  ? History of anemia   ? Hypertension   ? not currently being treated at this time- weight loss   ? Kidney stones   ? Ovarian torsion 2020  ? h/o LSO  ? Sickle cell trait ([place])   ? ?Patient Active Problem List  ? Diagnosis Date Noted  ? Ovarian cyst 07/16/2021  ? Sickle cell trait ([place]) 12/01/2020  ? Endometriosis   ? Morbid obesity ([place]) 11/30/2017  ? 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 Chronic pelvic pain in female 10/02/2011  ? ?Home Medication(s) ?Prior to Admission medications   ?Medication Sig Start Date End Date Taking? Authorizing Provider  ?cabergoline (DOSTINEX) 0.5 MG tablet Take 0.5 tablets (0.25 mg total) by mouth 2 (two) times a week. 03/05/22   Donnamae Jude, MD  ?cetirizine (ZYRTEC) 10 MG tablet SMARTSIG:1 Tablet(s) By Mouth Every Evening PRN 04/03/21   [provider]  ?ELDERBERRY PO Take by mouth.    [provider]  ?ibuprofen (ADVIL) 800 MG tablet Take 1 tablet (800 mg total) by mouth every 8 (eight) hours as needed. ?Patient not taking: Reported on 01/05/2022 01/04/20   Woodroe Mode, MD  ?medroxyPROGESTERone (PROVERA)  10 MG tablet Take 1 tablet (10 mg total) by mouth daily. ?Patient not taking: Reported on 01/05/2022 06/11/21   Truett Mainland, DO  ?Melatonin 1 MG CAPS Take by mouth. ?Patient not taking: Reported on 01/05/2022    [provider]  ?ondansetron (ZOFRAN ODT) 4 MG disintegrating tablet Take 1 tablet (4 mg total) by mouth every 8 (eight) hours as needed for nausea or vomiting. ?Patient not taking: Reported on 01/05/2022 07/02/21   Maudie Flakes, MD  ?oxyCODONE-acetaminophen (PERCOCET/ROXICET) 5-325 MG tablet Take 3 tablets by mouth every 6 (six) hours as needed for severe pain (gets this at the pain clinic).    [provider]  ?polyethylene glycol (MIRALAX) 17 g packet Take 17 g by mouth daily. ?Patient not taking: Reported on 01/05/2022 03/22/20   Woodroe Mode, MD  ?topiramate (TOPAMAX) 50 MG tablet  01/30/21   [provider]  ?triamcinolone cream (KENALOG) 0.5 % Apply topically 2 (two) times daily as needed. ?Patient not taking: Reported on 01/05/2022 04/03/21   [provider]  ?                                                                                                                                  ?  Allergies ?Latex, Mushroom extract complex, and Peanut butter flavor ? ?Review of Systems ?Review of Systems  ?Neurological:  Positive for headaches.  ?As noted in HPI ? ?Physical Exam ?Vital Signs  ?I have reviewed the triage vital signs ?BP (!) 129/93   Pulse 80   Temp 98.1 ?F (36.7 ?C) (Oral)   Resp 18   Ht '5\' 3"'$  (1.6 m)   Wt 88.9 kg   LMP 04/19/2022 (Approximate)   SpO2 98%   BMI 34.72 kg/m?  ? ?Physical Exam ?Vitals reviewed.  ?Constitutional:   ?   General: She is not in acute distress. ?   Appearance: She is well-developed. She is not diaphoretic.  ?HENT:  ?   Head: Normocephalic and atraumatic.  ?   Right Ear: External ear normal.  ?   Left Ear: External ear normal.  ?   Nose: Nose normal.  ?Eyes:  ?   General: No scleral icterus. ?   Conjunctiva/sclera:  Conjunctivae normal.  ?Neck:  ?   Trachea: Phonation normal.  ?Cardiovascular:  ?   Rate and Rhythm: Normal rate and regular rhythm.  ?Pulmonary:  ?   Effort: Pulmonary effort is normal. No respiratory distress.  ?   Breath sounds: No stridor.  ?Abdominal:  ?   General: There is no distension.  ?Musculoskeletal:     ?   General: Normal range of motion.  ?   Cervical back: Normal range of motion.  ?Neurological:  ?   Mental Status: She is alert and oriented to person, place, and time.  ?   Comments: Mental Status:  ?Alert and oriented to person, place, and time.  ?Attention and concentration normal.  ?Speech clear.  ?Recent memory is intact ? ?Cranial Nerves:  ?II Visual Fields: Intact to confrontation. Visual fields intact. ?III, IV, VI: Pupils equal and reactive to light and near. Full eye movement without nystagmus  ?V Facial Sensation: Normal. No weakness of masticatory muscles  ?VII: No facial weakness or asymmetry  ?VIII Auditory Acuity: Grossly normal  ?IX/X: The uvula is midline; the palate elevates symmetrically  ?XI: Normal sternocleidomastoid and trapezius strength  ?XII: The tongue is midline. No atrophy or fasciculations.  ? ?Motor System: Muscle Strength: 5/5 and symmetric in the upper and lower extremities. No pronation or drift.  ?Muscle Tone: Tone and muscle bulk are normal in the upper and lower extremities.  ?Coordination:  No tremor.  ?Sensation: Intact to light touch. ?Gait: Routine gait normal. ?  ?Psychiatric:     ?   Behavior: Behavior normal.  ? ? ?ED Results and Treatments ?Labs ?(all labs ordered are listed, but only abnormal results are displayed) ?Labs Reviewed - No data to display                                                                                                                       ?EKG ? EKG Interpretation ? ?Date/Time:    ?Ventricular Rate:    ?PR Interval:    ?QRS  Duration:   ?QT Interval:    ?QTC Calculation:   ?R Axis:     ?Text Interpretation:   ?  ? ?   ? ?Radiology ?No results found. ? ?Pertinent labs & imaging results that were available during my care of the patient were reviewed by me and considered in my medical decision making (see MDM for details). ? ?Medications Ordered in ED ?Medications  ?ketorolac (TORADOL) injection 30 mg (30 mg Intramuscular Given 04/26/22 0630)  ?prochlorperazine (COMPAZINE) injection 10 mg (10 mg Intramuscular Given 04/26/22 0701)  ?ondansetron (ZOFRAN-ODT) disintegrating tablet 4 mg (4 mg Oral Given 04/26/22 0656)  ?                                                               ?                                                                    ?Procedures ?Procedures ? ?(including critical care time) ? ?Medical Decision Making / ED Course ? ? ? Complexity of Problem: ? ?Co-morbidities/SDOH that complicate the patient evaluation/care: ?Noted in HPI ? ?Additional history obtained: ?MRI showing microadenoma ? ?Patient's presenting problem/concern, DDX, and MDM listed below: ?Typical migraine headache for the pt. Non focal neuro exam. No recent head trauma. No fever. Doubt meningitis. Doubt intracranial bleed. Doubt IIH. No indication for imaging.  ?Will treat with migraine cocktail and reevaluate. ? ?  ?ED Course:   ? ?Assessment, Add'l Intervention, and Reassessment: ?Migraine headache ?Significantly improved after cocktail  ? ? ?Final Clinical Impression(s) / ED Diagnoses ?Final diagnoses:  ?Bad headache  ? ?The patient appears reasonably screened and/or stabilized for discharge and I doubt any other medical condition or other [facility] requiring further screening, evaluation, or treatment in the ED at this time prior to discharge. Safe for discharge with strict return precautions. ? ?Disposition: Discharge ? ?Condition: Good ? ?I have discussed the results, Dx and Tx plan with the patient/family who expressed understanding and agree(s) with the plan. Discharge instructions discussed at length. The patient/family was given strict return  precautions who verbalized understanding of the instructions. No further questions at time of discharge.  ? ? ?ED Discharge Orders   ? ? None  ? ?  ? ? ? ? ?Follow Up: ?[facility] ?[address] ?[place] [phone]

## 2022-05-13 ENCOUNTER — Ambulatory Visit (INDEPENDENT_AMBULATORY_CARE_PROVIDER_SITE_OTHER): Payer: Medicaid Other | Admitting: Obstetrics & Gynecology

## 2022-05-13 VITALS — BP 124/85 | HR 92 | Wt 197.0 lb

## 2022-05-13 DIAGNOSIS — D352 Benign neoplasm of pituitary gland: Secondary | ICD-10-CM | POA: Diagnosis not present

## 2022-05-13 DIAGNOSIS — N809 Endometriosis, unspecified: Secondary | ICD-10-CM | POA: Diagnosis not present

## 2022-05-13 DIAGNOSIS — D369 Benign neoplasm, unspecified site: Secondary | ICD-10-CM | POA: Diagnosis not present

## 2022-05-13 DIAGNOSIS — E229 Hyperfunction of pituitary gland, unspecified: Secondary | ICD-10-CM | POA: Diagnosis not present

## 2022-05-13 MED ORDER — CABERGOLINE 0.5 MG PO TABS: 0.2500 mg | ORAL_TABLET | ORAL | 1 refills | Status: AC

## 2022-05-13 MED ORDER — NORGESTIMATE-ETH ESTRADIOL 0.25-35 MG-MCG PO TABS: 1.0000 | ORAL_TABLET | Freq: Every day | ORAL | 11 refills | Status: DC

## 2022-05-13 NOTE — Progress Notes (Signed)
Patient here for irregular bleeding. She stated " I will bleed and spot for days but then it will stop. Its been doing this for months"

## 2022-05-13 NOTE — Progress Notes (Signed)
Patient ID: Kathryn Suarez, female   DOB: 02/12/1986, 36 y.o.   MRN: [id number]  Chief Complaint  Patient presents with   Gynecologic Exam    HPI Kathryn Suarez is a 36 y.o. female.  G1P0010 Patient's last menstrual period was 04/19/2022 (approximate). Patient has endometriosis and has a Liletta in place since 2018. She has pelvic pain, dyspareunia and frequent spotting. MRI dx microadenoma of pituitary and she is to see endocrine next week.  HPI  Past Medical History:  Diagnosis Date   Asthma    Chronic pelvic pain in female    Endometriosis of the cul-de-sac    stage 1, biopsy proven with laparoscopy 05/2018   History of anemia    Hypertension    not currently being treated at this time- weight loss    Kidney stones    Ovarian torsion 2020   h/o LSO   Sickle cell trait [facility])     Past Surgical History:  Procedure Laterality Date   DILATION AND CURETTAGE OF UTERUS  03/23/2007   EXCISION OF TONGUE LESION     LAPAROSCOPIC OOPHERECTOMY Left 08/2019   in [place] 06/01/2018   Procedure: LAPAROSCOPY DIAGNOSTIC, PERITONEAL BIOPSY;  Surgeon: Emily Filbert, MD;  Location: [facility];  Service: Gynecology;  Laterality: N/A;    Family History  Problem Relation Age of Onset   Hypertension Mother    Sickle cell trait Father     Social History Social History   Tobacco Use   Smoking status: Never   Smokeless tobacco: Never  Vaping Use   Vaping Use: Never used  Substance Use Topics   Alcohol use: No    Comment: occas   Drug use: No    Allergies  Allergen Reactions   Latex Hives and Rash   Mushroom Extract Complex Anaphylaxis   Peanut Butter Flavor Hives    Current Outpatient Medications  Medication Sig Dispense Refill   cetirizine (ZYRTEC) 10 MG tablet SMARTSIG:1 Tablet(s) By Mouth Every Evening PRN     ELDERBERRY PO Take by mouth.     norgestimate-ethinyl estradiol (ORTHO-CYCLEN) 0.25-35 MG-MCG tablet Take 1 tablet by mouth daily.  28 tablet 11   oxyCODONE-acetaminophen (PERCOCET/ROXICET) 5-325 MG tablet Take 3 tablets by mouth every 6 (six) hours as needed for severe pain (gets this at the pain clinic).     [START ON 05/14/2022] cabergoline (DOSTINEX) 0.5 MG tablet Take 0.5 tablets (0.25 mg total) by mouth 2 (two) times a week. 10 tablet 1   hydrOXYzine (ATARAX) 25 MG tablet Take 25 mg by mouth 2 (two) times daily as needed.     ibuprofen (ADVIL) 800 MG tablet Take 1 tablet (800 mg total) by mouth every 8 (eight) hours as needed. (Patient not taking: Reported on 01/05/2022) 30 tablet 0   medroxyPROGESTERone (PROVERA) 10 MG tablet Take 1 tablet (10 mg total) by mouth daily. (Patient not taking: Reported on 01/05/2022) 90 tablet 3   Melatonin 1 MG CAPS Take by mouth. (Patient not taking: Reported on 01/05/2022)     ondansetron (ZOFRAN ODT) 4 MG disintegrating tablet Take 1 tablet (4 mg total) by mouth every 8 (eight) hours as needed for nausea or vomiting. (Patient not taking: Reported on 01/05/2022) 20 tablet 0   polyethylene glycol (MIRALAX) 17 g packet Take 17 g by mouth daily. (Patient not taking: Reported on 01/05/2022) 14 each 0   topiramate (TOPAMAX) 50 MG tablet  (Patient not taking: Reported on 01/05/2022)  triamcinolone cream (KENALOG) 0.5 % Apply topically 2 (two) times daily as needed. (Patient not taking: Reported on 01/05/2022)     No current facility-administered medications for this visit.    Review of Systems Review of Systems  Genitourinary:  Positive for dyspareunia, menstrual problem, pelvic pain and vaginal bleeding.  Neurological:  Positive for headaches.   Blood pressure 124/85, pulse 92, weight 197 lb (89.4 kg), last menstrual period 04/19/2022.  Physical Exam Physical Exam Vitals and nursing note reviewed.  Constitutional:      Appearance: Normal appearance.  Cardiovascular:     Rate and Rhythm: Normal rate.  Pulmonary:     Effort: Pulmonary effort is normal.  Neurological:     Mental Status:  She is alert.  Psychiatric:        Mood and Affect: Mood normal.        Behavior: Behavior normal.    Data Reviewed Medication list, office notes, MRI result  Assessment Endometriosis - Plan: norgestimate-ethinyl estradiol (ORTHO-CYCLEN) 0.25-35 MG-MCG tablet, US PELVIC COMPLETE WITH TRANSVAGINAL  Pituitary microadenoma with hyperprolactinemia ([place])  Microadenoma - Plan: cabergoline (DOSTINEX) 0.5 MG tablet   Plan Add OCP for cycle control Pelvic US  RTC to consider removal of IUD if she is doing well on OCP    [name] 05/13/2022, 1:49 PM

## 2022-05-27 ENCOUNTER — Ambulatory Visit: Payer: Medicaid Other

## 2022-06-08 ENCOUNTER — Ambulatory Visit
Admission: RE | Admit: 2022-06-08 | Discharge: 2022-06-08 | Disposition: A | Discharge status: Home / Self Care | Payer: Medicaid Other | Source: Ambulatory Visit | Attending: Obstetrics & Gynecology | Admitting: Obstetrics & Gynecology

## 2022-06-08 DIAGNOSIS — N809 Endometriosis, unspecified: Secondary | ICD-10-CM | POA: Diagnosis present

## 2022-06-13 ENCOUNTER — Encounter: Payer: Self-pay | Admitting: Obstetrics & Gynecology

## 2022-06-23 ENCOUNTER — Encounter: Payer: Self-pay | Admitting: General Practice

## 2022-06-23 NOTE — Progress Notes (Unsigned)
Patient came by office stating she was seen at [facility] last night for bleeding. She states she was given a shot of Toradol for pain relief and was told her CBC was a 9 or 10. She states her pain isn't that bad, it's the bleeding that is bothering her. Offered work in appt for Thursday 7/13 with Dr Roselie Awkward. Patient verbalized understanding.

## 2022-06-25 ENCOUNTER — Ambulatory Visit (INDEPENDENT_AMBULATORY_CARE_PROVIDER_SITE_OTHER): Payer: Medicaid Other | Admitting: Obstetrics & Gynecology

## 2022-06-25 ENCOUNTER — Encounter: Payer: Self-pay | Admitting: Obstetrics & Gynecology

## 2022-06-25 ENCOUNTER — Other Ambulatory Visit: Payer: Self-pay

## 2022-06-25 VITALS — BP 122/79 | HR 91 | Ht 63.25 in | Wt 199.3 lb

## 2022-06-25 DIAGNOSIS — R102 Pelvic and perineal pain: Secondary | ICD-10-CM

## 2022-06-25 DIAGNOSIS — N643 Galactorrhea not associated with childbirth: Secondary | ICD-10-CM | POA: Diagnosis not present

## 2022-06-25 DIAGNOSIS — D352 Benign neoplasm of pituitary gland: Secondary | ICD-10-CM

## 2022-06-25 DIAGNOSIS — E229 Hyperfunction of pituitary gland, unspecified: Secondary | ICD-10-CM

## 2022-06-25 DIAGNOSIS — G8929 Other chronic pain: Secondary | ICD-10-CM | POA: Diagnosis not present

## 2022-06-25 MED ORDER — NORETHINDRONE-ETH ESTRADIOL 1-35 MG-MCG PO TABS: 1.0000 | ORAL_TABLET | Freq: Every day | ORAL | 11 refills | Status: DC

## 2022-06-25 NOTE — Progress Notes (Signed)
Patient ID: Kathryn Suarez, female   DOB: 1986-04-21, 36 y.o.   MRN: [id number]  Chief Complaint  Patient presents with   Follow-up    HPI Kathryn Suarez is a 36 y.o. female.  G1P0010 No LMP recorded. (Menstrual status: IUD). She has irregular vaginal bleeding which did not improve on her second pack of OCP. IUD was normal position on [place] 3 weeks ago.  HPI  Past Medical History:  Diagnosis Date   Asthma    Chronic pelvic pain in female    Endometriosis of the cul-de-sac    stage 1, biopsy proven with laparoscopy 05/2018   History of anemia    Hypertension    not currently being treated at this time- weight loss    Kidney stones    Ovarian torsion 2020   h/o LSO   Sickle cell trait [facility])     Past Surgical History:  Procedure Laterality Date   DILATION AND CURETTAGE OF UTERUS  03/23/2007   EXCISION OF TONGUE LESION     LAPAROSCOPIC OOPHERECTOMY Left 08/2019   in [place] 06/01/2018   Procedure: LAPAROSCOPY DIAGNOSTIC, PERITONEAL BIOPSY;  Surgeon: Emily Filbert, MD;  Location: [facility];  Service: Gynecology;  Laterality: N/A;    Family History  Problem Relation Age of Onset   Hypertension Mother    Sickle cell trait Father     Social History Social History   Tobacco Use   Smoking status: Never   Smokeless tobacco: Never  Vaping Use   Vaping Use: Never used  Substance Use Topics   Alcohol use: No    Comment: occas   Drug use: No    Allergies  Allergen Reactions   Latex Hives and Rash   Mushroom Extract Complex Anaphylaxis   Peanut Butter Flavor Hives    Current Outpatient Medications  Medication Sig Dispense Refill   cabergoline (DOSTINEX) 0.5 MG tablet Take 0.5 tablets (0.25 mg total) by mouth 2 (two) times a week. 10 tablet 1   cetirizine (ZYRTEC) 10 MG tablet SMARTSIG:1 Tablet(s) By Mouth Every Evening PRN     ELDERBERRY PO Take by mouth.     hydrocortisone 2.5 % cream Apply to affected skin of face/neck/groin 1-2  times daily as needed for itching/rash.     hydrOXYzine (ATARAX) 25 MG tablet Take 25 mg by mouth 2 (two) times daily as needed.     ibuprofen (ADVIL) 800 MG tablet Take 1 tablet (800 mg total) by mouth every 8 (eight) hours as needed. 30 tablet 0   levonorgestrel (MIRENA) 20 MCG/DAY IUD by Intrauterine route.     LORazepam (ATIVAN) 1 MG tablet Take by mouth.     medroxyPROGESTERone (PROVERA) 10 MG tablet Take 1 tablet (10 mg total) by mouth daily. 90 tablet 3   Melatonin 1 MG CAPS Take by mouth.     norethindrone-ethinyl estradiol 1/35 (ORTHO-NOVUM) tablet Take 1 tablet by mouth daily. 2 tablets daily until bleeding stops then one a day 28 tablet 11   norgestimate-ethinyl estradiol (ORTHO-CYCLEN) 0.25-35 MG-MCG tablet Take 1 tablet by mouth daily. 28 tablet 11   ondansetron (ZOFRAN ODT) 4 MG disintegrating tablet Take 1 tablet (4 mg total) by mouth every 8 (eight) hours as needed for nausea or vomiting. 20 tablet 0   oxyCODONE-acetaminophen (PERCOCET/ROXICET) 5-325 MG tablet Take 3 tablets by mouth every 6 (six) hours as needed for severe pain (gets this at the pain clinic).     polyethylene glycol (MIRALAX)  17 g packet Take 17 g by mouth daily. 14 each 0   PROZAC 20 MG capsule Take 20 mg by mouth every morning.     topiramate (TOPAMAX) 50 MG tablet      triamcinolone cream (KENALOG) 0.5 % Apply topically 2 (two) times daily as needed.     No current facility-administered medications for this visit.    Review of Systems Review of Systems  Constitutional: Negative.   Respiratory: Negative.    Gastrointestinal: Negative.   Endocrine:       Nipple discharge  Genitourinary:  Positive for menstrual problem and vaginal bleeding. Negative for vaginal discharge.    Blood pressure 122/79, pulse 91, height 5' 3.25" (1.607 m), weight 199 lb 4.8 oz (90.4 kg).  Physical Exam Physical Exam Constitutional:      Appearance: Normal appearance.  Cardiovascular:     Rate and Rhythm: Normal rate.   Pulmonary:     Effort: Pulmonary effort is normal.  Chest:  Breasts:    Right: Normal.     Left: Nipple discharge (white d/c expressed) present.  Abdominal:     General: Abdomen is flat.  Neurological:     Mental Status: She is alert.  Psychiatric:        Mood and Affect: Mood normal.        Behavior: Behavior normal.     Data Reviewed  Narrative & Impression  CLINICAL DATA:  Patient with endometriosis and pelvic pain.   EXAM: TRANSABDOMINAL AND TRANSVAGINAL ULTRASOUND OF PELVIS   TECHNIQUE: Both transabdominal and transvaginal ultrasound examinations of the pelvis were performed. Transabdominal technique was performed for global imaging of the pelvis including uterus, ovaries, adnexal regions, and pelvic cul-de-sac. It was necessary to proceed with endovaginal exam following the transabdominal exam to visualize the adnexal structures.   COMPARISON:  CT abdomen pelvis 11/22/2021   FINDINGS: Uterus   Measurements: 7.4 x 2.7 x 5.1 cm = volume: 53.6 mL. No fibroids or other mass visualized.   Endometrium   Intrauterine device is present.   Right ovary   Measurements: 4.6 x 2.7 x 2.9 cm = volume: 19.3 mL. Normal appearance/no adnexal mass.   Left ovary   Surgically absent   Other findings   Moderate volume free fluid.   IMPRESSION: Intrauterine device is visualized and appears appropriately located. No acute process.     Electronically Signed   By: Lovey Newcomer M.D.   On: 06/08/2022 09:31        Assessment Chronic pelvic pain in female - Plan: norethindrone-ethinyl estradiol 1/35 (ORTHO-NOVUM) tablet  Pituitary microadenoma with hyperprolactinemia ([place])  Galactorrhea   Plan Will continue hormonal therapy and will change BCP for now  Meds ordered this encounter  Medications   norethindrone-ethinyl estradiol 1/35 (ORTHO-NOVUM) tablet    Sig: Take 1 tablet by mouth daily. 2 tablets daily until bleeding stops then one a day    Dispense:  28  tablet    Refill:  11   RTC 2 months  [name] 06/25/2022, 2:25 PM

## 2022-07-13 ENCOUNTER — Ambulatory Visit: Payer: Medicaid Other | Admitting: Obstetrics & Gynecology

## 2022-08-14 ENCOUNTER — Encounter (HOSPITAL_BASED_OUTPATIENT_CLINIC_OR_DEPARTMENT_OTHER): Payer: Self-pay | Admitting: Obstetrics and Gynecology

## 2022-08-14 ENCOUNTER — Emergency Department (HOSPITAL_BASED_OUTPATIENT_CLINIC_OR_DEPARTMENT_OTHER)
Admission: EM | Admit: 2022-08-14 | Discharge: 2022-08-14 | Disposition: A | Discharge status: Home / Self Care | Payer: Medicaid Other | Attending: Emergency Medicine | Admitting: Emergency Medicine

## 2022-08-14 ENCOUNTER — Other Ambulatory Visit: Payer: Self-pay

## 2022-08-14 ENCOUNTER — Emergency Department (HOSPITAL_BASED_OUTPATIENT_CLINIC_OR_DEPARTMENT_OTHER): Payer: Medicaid Other

## 2022-08-14 DIAGNOSIS — R1013 Epigastric pain: Secondary | ICD-10-CM | POA: Insufficient documentation

## 2022-08-14 DIAGNOSIS — R1010 Upper abdominal pain, unspecified: Secondary | ICD-10-CM

## 2022-08-14 DIAGNOSIS — Z91040 Latex allergy status: Secondary | ICD-10-CM | POA: Diagnosis not present

## 2022-08-14 DIAGNOSIS — R1011 Right upper quadrant pain: Secondary | ICD-10-CM | POA: Insufficient documentation

## 2022-08-14 DIAGNOSIS — R1031 Right lower quadrant pain: Secondary | ICD-10-CM | POA: Insufficient documentation

## 2022-08-14 DIAGNOSIS — Z91010 Allergy to peanuts: Secondary | ICD-10-CM | POA: Insufficient documentation

## 2022-08-14 DIAGNOSIS — R112 Nausea with vomiting, unspecified: Secondary | ICD-10-CM | POA: Insufficient documentation

## 2022-08-14 LAB — COMPREHENSIVE METABOLIC PANEL
ALT: 9 U/L (ref 0–44)
AST: 17 U/L (ref 15–41)
Albumin: 4.2 g/dL (ref 3.5–5.0)
Alkaline Phosphatase: 63 U/L (ref 38–126)
Anion gap: 6 (ref 5–15)
BUN: 12 mg/dL (ref 6–20)
CO2: 28 mmol/L (ref 22–32)
Calcium: 8.8 mg/dL — ABNORMAL LOW (ref 8.9–10.3)
Chloride: 103 mmol/L (ref 98–111)
Creatinine, Ser: 0.85 mg/dL (ref 0.44–1.00)
GFR, Estimated: >60 mL/min (ref >60)
Glucose, Bld: 82 mg/dL (ref 70–99)
Potassium: 4 mmol/L (ref 3.5–5.1)
Sodium: 137 mmol/L (ref 135–145)
Total Bilirubin: 0.5 mg/dL (ref 0.3–1.2)
Total Protein: 7.3 g/dL (ref 6.5–8.1)

## 2022-08-14 LAB — CBC
HCT: 36.8 % (ref 36.0–46.0)
Hemoglobin: 12.5 g/dL (ref 12.0–15.0)
MCH: 28 pg (ref 26.0–34.0)
MCHC: 34 g/dL (ref 30.0–36.0)
MCV: 82.5 fL (ref 80.0–100.0)
Platelets: 263 10*3/uL (ref 150–400)
RBC: 4.46 MIL/uL (ref 3.87–5.11)
RDW: 12.3 % (ref 11.5–15.5)
WBC: 5.4 10*3/uL (ref 4.0–10.5)
nRBC: 0 % (ref 0.0–0.2)

## 2022-08-14 LAB — URINALYSIS, ROUTINE W REFLEX MICROSCOPIC
Bilirubin Urine: NEGATIVE
Glucose, UA: NEGATIVE mg/dL
Hgb urine dipstick: NEGATIVE
Ketones, ur: NEGATIVE mg/dL
Leukocytes,Ua: NEGATIVE
Nitrite: NEGATIVE
Protein, ur: NEGATIVE mg/dL
Specific Gravity, Urine: 1.014 (ref 1.005–1.030)
pH: 6.5 (ref 5.0–8.0)

## 2022-08-14 LAB — LIPASE, BLOOD: Lipase: 29 U/L (ref 11–51)

## 2022-08-14 LAB — PREGNANCY, URINE: Preg Test, Ur: NEGATIVE

## 2022-08-14 MED ORDER — OMEPRAZOLE 20 MG PO CPDR: 20.0000 mg | DELAYED_RELEASE_CAPSULE | Freq: Every day | ORAL | 0 refills | Status: DC

## 2022-08-14 MED ORDER — ONDANSETRON 4 MG PO TBDP: ORAL_TABLET | ORAL | 0 refills | Status: AC

## 2022-08-14 MED ORDER — FENTANYL CITRATE PF 50 MCG/ML IJ SOSY
50.0000 ug | PREFILLED_SYRINGE | Freq: Once | INTRAMUSCULAR | Status: AC
  Administered 2022-08-14: 50 ug via INTRAVENOUS
  Filled 2022-08-14: qty 1

## 2022-08-14 MED ORDER — ONDANSETRON HCL 4 MG/2ML IJ SOLN
4.0000 mg | Freq: Once | INTRAMUSCULAR | Status: AC
  Administered 2022-08-14: 4 mg via INTRAVENOUS
  Filled 2022-08-14: qty 2

## 2022-08-14 MED ORDER — IOHEXOL 300 MG/ML  SOLN
100.0000 mL | Freq: Once | INTRAMUSCULAR | Status: AC | PRN
Start: 2022-08-14 — End: 2022-08-14
  Administered 2022-08-14: 100 mL via INTRAVENOUS

## 2022-08-14 MED ORDER — SODIUM CHLORIDE 0.9 % IV BOLUS
1000.0000 mL | Freq: Once | INTRAVENOUS | Status: AC
  Administered 2022-08-14: 1000 mL via INTRAVENOUS

## 2022-08-14 MED ORDER — PANTOPRAZOLE SODIUM 40 MG IV SOLR
40.0000 mg | Freq: Once | INTRAVENOUS | Status: AC
  Administered 2022-08-14: 40 mg via INTRAVENOUS
  Filled 2022-08-14: qty 10

## 2022-08-14 NOTE — ED Triage Notes (Signed)
Patient reports to the ER for abdominal pain. States she is having nausea and emesis. Reports a hx of torsion x2 years ago and reports this feels similar. Patient denies chance of pregnancy and reports her right side into her flank feels swollen. Endorses using heating pad and percocet '10mg'$  for pain.

## 2022-08-14 NOTE — ED Notes (Signed)
Patient given ginger ale for PO fluid challenge.

## 2022-08-14 NOTE — ED Provider Notes (Signed)
[facility] DEPT Provider Note   CSN: [id number] Arrival date & time: 08/14/22  9678     History  Chief Complaint  Patient presents with   Abdominal Pain    Kathryn Suarez is a 36 y.o. female.  Patient is a 36 year old female who presents with abdominal pain.  On chart review, she has a history of prior ovarian torsion on the left several years ago status post left oophorectomy.  She also has a history of polycystic ovarian syndrome as well as endometriosis which was diagnosed following the torsion.  She presents today with nominal pain.  She says its in her right flank/back area.  She has some pain across her lower abdomen which she says is chronic and unchanged from her baseline pain related to her endometriosis.  However over the last 2 days she has noted some pain more in her right back/flank area.  She has some increased pain with urination.  She also has some burning in her epigastrium after she eats along with some nausea and has had some occasional emesis.  No known fevers.  No urinary symptoms other than her pain seems to increase with urination.  She does not have any increased or changed pain in her pelvic area.  The triage note said that her pain feels similar to her prior torsion but I asked her about this and her pain seems to be more in her upper abdomen/flank area today versus her torsion which was more in her pelvic area.  She says her pelvic pain is unchanged from her baseline pain.       Home Medications Prior to Admission medications   Medication Sig Start Date End Date Taking? Authorizing Provider  omeprazole (PRILOSEC) 20 MG capsule Take 1 capsule (20 mg total) by mouth daily. 08/14/22  Yes Malvin Johns, MD  ondansetron (ZOFRAN-ODT) 4 MG disintegrating tablet '4mg'$  ODT q4 hours prn nausea/vomit 08/14/22  Yes Malvin Johns, MD  cabergoline (DOSTINEX) 0.5 MG tablet Take 0.5 tablets (0.25 mg total) by mouth 2 (two) times a week. 05/14/22   Woodroe Mode, MD  cetirizine (ZYRTEC) 10 MG tablet SMARTSIG:1 Tablet(s) By Mouth Every Evening PRN 04/03/21   [provider]  ELDERBERRY PO Take by mouth.    [provider]  hydrocortisone 2.5 % cream Apply to affected skin of face/neck/groin 1-2 times daily as needed for itching/rash. 08/04/21   [provider]  hydrOXYzine (ATARAX) 25 MG tablet Take 25 mg by mouth 2 (two) times daily as needed. 04/10/22   [provider]  ibuprofen (ADVIL) 800 MG tablet Take 1 tablet (800 mg total) by mouth every 8 (eight) hours as needed. 01/04/20   Woodroe Mode, MD  levonorgestrel (MIRENA) 20 MCG/DAY IUD by Intrauterine route. 06/23/21   [provider]  LORazepam (ATIVAN) 1 MG tablet Take by mouth. 02/09/22   [provider]  medroxyPROGESTERone (PROVERA) 10 MG tablet Take 1 tablet (10 mg total) by mouth daily. 06/11/21   Truett Mainland, DO  Melatonin 1 MG CAPS Take by mouth.    [provider]  norethindrone-ethinyl estradiol 1/35 (ORTHO-NOVUM) tablet Take 1 tablet by mouth daily. 2 tablets daily until bleeding stops then one a day 06/25/22   Woodroe Mode, MD  norgestimate-ethinyl estradiol (ORTHO-CYCLEN) 0.25-35 MG-MCG tablet Take 1 tablet by mouth daily. 05/13/22   Woodroe Mode, MD  oxyCODONE-acetaminophen (PERCOCET/ROXICET) 5-325 MG tablet Take 3 tablets by mouth every 6 (six) hours as needed for severe  pain (gets this at the pain clinic).    [provider]  polyethylene glycol (MIRALAX) 17 g packet Take 17 g by mouth daily. 03/22/20   Woodroe Mode, MD  PROZAC 20 MG capsule Take 20 mg by mouth every morning. 05/28/22   [provider]  topiramate (TOPAMAX) 50 MG tablet  01/30/21   [provider]  triamcinolone cream (KENALOG) 0.5 % Apply topically 2 (two) times daily as needed. 04/03/21   [provider]      Allergies    Latex, Mushroom extract complex, and Peanut butter flavor    Review of Systems    Review of Systems  Constitutional:  Negative for chills, diaphoresis, fatigue and fever.  HENT:  Negative for congestion, rhinorrhea and sneezing.   Eyes: Negative.   Respiratory:  Negative for cough, chest tightness and shortness of breath.   Cardiovascular:  Negative for chest pain and leg swelling.  Gastrointestinal:  Positive for abdominal pain, nausea and vomiting. Negative for blood in stool and diarrhea.  Genitourinary:  Positive for flank pain and pelvic pain (Chronic). Negative for difficulty urinating, frequency and hematuria.  Musculoskeletal:  Negative for arthralgias and back pain.  Skin:  Negative for rash.  Neurological:  Negative for dizziness, speech difficulty, weakness, numbness and headaches.    Physical Exam Updated Vital Signs BP 134/84   Pulse 87   Temp 98.6 F (37 C) (Oral)   Resp 16   Ht '5\' 3"'$  (1.6 m)   Wt 89.8 kg   SpO2 100%   BMI 35.07 kg/m  Physical Exam Constitutional:      Appearance: She is well-developed.  HENT:     Head: Normocephalic and atraumatic.  Eyes:     Pupils: Pupils are equal, round, and reactive to light.  Cardiovascular:     Rate and Rhythm: Normal rate and regular rhythm.     Heart sounds: Normal heart sounds.  Pulmonary:     Effort: Pulmonary effort is normal. No respiratory distress.     Breath sounds: Normal breath sounds. No wheezing or rales.  Chest:     Chest wall: No tenderness.  Abdominal:     General: Bowel sounds are normal.     Palpations: Abdomen is soft.     Tenderness: There is abdominal tenderness in the right upper quadrant and epigastric area. There is no guarding or rebound.     Comments: Right CVA tenderness  Musculoskeletal:        General: Normal range of motion.     Cervical back: Normal range of motion and neck supple.  Lymphadenopathy:     Cervical: No cervical adenopathy.  Skin:    General: Skin is warm and dry.     Findings: No rash.  Neurological:     Mental Status: She is alert and  oriented to person, place, and time.     ED Results / Procedures / Treatments   Labs (all labs ordered are listed, but only abnormal results are displayed) Labs Reviewed  COMPREHENSIVE METABOLIC PANEL - Abnormal; Notable for the following components:      Result Value   Calcium 8.8 (*)    All other components within normal limits  LIPASE, BLOOD  CBC  URINALYSIS, ROUTINE W REFLEX MICROSCOPIC  PREGNANCY, URINE    EKG None  Radiology CT Abdomen Pelvis W Contrast  Result Date: 08/14/2022 CLINICAL DATA:  RIGHT lower quadrant abdominal pain EXAM: CT ABDOMEN AND PELVIS WITH CONTRAST TECHNIQUE: Multidetector CT imaging of the  abdomen and pelvis was performed using the standard protocol following bolus administration of intravenous contrast. RADIATION DOSE REDUCTION: This exam was performed according to the departmental dose-optimization program which includes automated exposure control, adjustment of the mA and/or kV according to patient size and/or use of iterative reconstruction technique. CONTRAST:  145m OMNIPAQUE IOHEXOL 300 MG/ML  SOLN COMPARISON:  None Available. FINDINGS: Lower chest: Lung bases are clear. Hepatobiliary: No focal hepatic lesion. No biliary duct dilatation. Normal gallbladder. Common bile duct is normal. Pancreas: Pancreas is normal. No ductal dilatation. No pancreatic inflammation. Spleen: Normal spleen Adrenals/urinary tract: Adrenal glands and kidneys are normal. The ureters and bladder normal. Stomach/Bowel: Stomach, duodenum and small-bowel normal. Appendix is normal without inflammation (image 43/5/coronal). The colon and rectosigmoid colon are normal. Vascular/Lymphatic: Abdominal aorta is normal caliber. No periportal or retroperitoneal adenopathy. No pelvic adenopathy. Reproductive: IUD in expected location with uterine body. RIGHT ovary appears normal measuring 4.3 x 2.7 x 3.5 cm. LEFT ovary surgically absent. Other: No free fluid. Musculoskeletal: No aggressive  osseous lesion. IMPRESSION: 1. Normal gallbladder and appendix. 2. No obstructive uropathy. 3. Normal RIGHT ovary.  LEFT ovary surgically absent. 4. IUD in expected location the uterus. Electronically Signed   By: SSuzy BouchardM.D.   On: 08/14/2022 10:11    Procedures Procedures    Medications Ordered in ED Medications  fentaNYL (SUBLIMAZE) injection 50 mcg (50 mcg Intravenous Given 08/14/22 0940)  sodium chloride 0.9 % bolus 1,000 mL (1,000 mLs Intravenous New Bag/Given 08/14/22 0939)  ondansetron (ZOFRAN) injection 4 mg (4 mg Intravenous Given 08/14/22 0940)  pantoprazole (PROTONIX) injection 40 mg (40 mg Intravenous Given 08/14/22 0942)  iohexol (OMNIPAQUE) 300 MG/ML solution 100 mL (100 mLs Intravenous Contrast Given 08/14/22 06812    ED Course/ Medical Decision Making/ A&P                           Medical Decision Making Amount and/or Complexity of Data Reviewed Labs: ordered. Radiology: ordered.  Risk Prescription drug management.   Patient is a [age]year old female who presents with abdominal pain.  She has some chronic lower abdominal pain which seems to be at baseline.  She now has more pain across her upper abdomen and right upper quadrant.  CT scan was performed which shows no acute abnormalities.  No evidence of kidney stone.  No evidence of gallbladder disease.  Her appendix appeared normal.  This was reviewed by me and confirmed by the radiologist.  Her urinalysis is not consistent with infection.  Her labs are reviewed.  There is no evidence of pancreatitis or hepatitis.  Her LFTs are normal.  Abdominal exam is benign.  She does not have clinical exam that would be more consistent or concerning for torsion.  She is feeling better after treatment in the ED.  She was discharged home in good condition.  She was given a prescription for Prilosec and Zofran for symptomatic relief.  She was encouraged to have follow-up with her PCP.  Return precautions were given.  Final Clinical  Impression(s) / ED Diagnoses Final diagnoses:  Pain of upper abdomen    Rx / DC Orders ED Discharge Orders          Ordered    omeprazole (PRILOSEC) 20 MG capsule  Daily        08/14/22 1122    ondansetron (ZOFRAN-ODT) 4 MG disintegrating tablet        08/14/22 1122  Malvin Johns, MD 08/14/22 1124

## 2022-08-14 NOTE — ED Notes (Addendum)
Patient reports she feels weird and dizzy. Patient is tearful and reports she is "trying to get herself together". Patient took a sip of ginger ale. RN walked patient to the bathroom without incident. Patient reports she is concerned on of the medications made her feel weird. RN explained that the Zofran should not have caused dizziness as it is for nausea, Protonix is for her stomach acid, and the Fentanyl, while for pain, has a short half life. Patient reports she is concerned that on top of her Pain medication from home maybe the fentanyl was too much. RN gave patient a snack with some ginger ale to assist in waking her up.

## 2022-08-28 ENCOUNTER — Encounter: Payer: Self-pay | Admitting: Family Medicine

## 2022-08-28 ENCOUNTER — Ambulatory Visit: Payer: Medicaid Other | Admitting: Family Medicine

## 2022-08-28 NOTE — Progress Notes (Signed)
Patient did not keep appointment today. She may call to reschedule.  

## 2022-09-04 ENCOUNTER — Ambulatory Visit: Payer: Medicaid Other | Admitting: Internal Medicine

## 2022-09-07 ENCOUNTER — Ambulatory Visit: Payer: Medicaid Other | Admitting: Internal Medicine

## 2022-09-07 NOTE — Progress Notes (Deleted)
Name: Kathryn Suarez  MRN/ DOB: [id number], Nov 06, 1986    Age/ Sex: 36 y.o., female    PCP: [facility], [facility]   Reason for Endocrinology Evaluation: Galactorrhea      Date of Initial Endocrinology Evaluation: 09/07/2022     HPI: Kathryn Suarez is a 36 y.o. female with a past medical history of sickle cell trait, infertility and endometriosis. The patient presented for initial endocrinology clinic visit on 09/07/2022 for consultative assistance with her Galactorrhea .   Patient has been referred here for hyperprolactinemia , she has been noted with mild elevation of prolactin in February 2022, 33.6 NG/mL which normalized by June 2022, patient returned with elevated prolactin at 27.7 NG/mL in January 2023  Of note the patient has been noted with elevated DHEA-S at 435 UG/DL, but normal FSH, estradiol, testosterone and TFTs  Brain MRI showed a 3 mm pituitary adenoma in February 2023.  She was started on cabergoline through GYN by March 2023  HISTORY:  Past Medical History:  Past Medical History:  Diagnosis Date   Asthma    Chronic pelvic pain in female    Endometriosis of the cul-de-sac    stage 1, biopsy proven with laparoscopy 05/2018   History of anemia    Hypertension    not currently being treated at this time- weight loss    Kidney stones    Ovarian torsion 2020   h/o LSO   Sickle cell trait ([place])    Past Surgical History:  Past Surgical History:  Procedure Laterality Date   DILATION AND CURETTAGE OF UTERUS  03/23/2007   EXCISION OF TONGUE LESION     LAPAROSCOPIC OOPHERECTOMY Left 08/2019   in [place] 06/01/2018   Procedure: LAPAROSCOPY DIAGNOSTIC, PERITONEAL BIOPSY;  Surgeon: Emily Filbert, MD;  Location: [facility];  Service: Gynecology;  Laterality: N/A;    Social History:  reports that she has never smoked. She has never been exposed to tobacco smoke. She has never used smokeless tobacco. She reports that she does not drink  alcohol and does not use drugs. Family History: family history includes Hypertension in her mother; Sickle cell trait in her father.   HOME MEDICATIONS: Allergies as of 09/07/2022       Reactions   Latex Hives, Rash   Mushroom Extract Complex Anaphylaxis   Peanut Butter Flavor Hives        Medication List        Accurate as of September 07, 2022  7:11 AM. If you have any questions, ask your nurse or doctor.          cabergoline 0.5 MG tablet Commonly known as: DOSTINEX Take 0.5 tablets (0.25 mg total) by mouth 2 (two) times a week.   cetirizine 10 MG tablet Commonly known as: ZYRTEC SMARTSIG:1 Tablet(s) By Mouth Every Evening PRN   ELDERBERRY PO Take by mouth.   hydrocortisone 2.5 % cream Apply to affected skin of face/neck/groin 1-2 times daily as needed for itching/rash.   hydrOXYzine 25 MG tablet Commonly known as: ATARAX Take 25 mg by mouth 2 (two) times daily as needed.   ibuprofen 800 MG tablet Commonly known as: ADVIL Take 1 tablet (800 mg total) by mouth every 8 (eight) hours as needed.   levonorgestrel 20 MCG/DAY Iud Commonly known as: MIRENA by Intrauterine route.   LORazepam 1 MG tablet Commonly known as: ATIVAN Take by mouth.   medroxyPROGESTERone 10 MG tablet Commonly known as:  PROVERA Take 1 tablet (10 mg total) by mouth daily.   Melatonin 1 MG Caps Take by mouth.   norethindrone-ethinyl estradiol 1/35 tablet Commonly known as: ORTHO-NOVUM Take 1 tablet by mouth daily. 2 tablets daily until bleeding stops then one a day   norgestimate-ethinyl estradiol 0.25-35 MG-MCG tablet Commonly known as: ORTHO-CYCLEN Take 1 tablet by mouth daily.   omeprazole 20 MG capsule Commonly known as: PRILOSEC Take 1 capsule (20 mg total) by mouth daily.   ondansetron 4 MG disintegrating tablet Commonly known as: ZOFRAN-ODT '4mg'$  ODT q4 hours prn nausea/vomit   oxyCODONE-acetaminophen 5-325 MG tablet Commonly known as: PERCOCET/ROXICET Take 3  tablets by mouth every 6 (six) hours as needed for severe pain (gets this at the pain clinic).   polyethylene glycol 17 g packet Commonly known as: MiraLax Take 17 g by mouth daily.   PROzac 20 MG capsule Generic drug: FLUoxetine Take 20 mg by mouth every morning.   topiramate 50 MG tablet Commonly known as: TOPAMAX   triamcinolone cream 0.5 % Commonly known as: KENALOG Apply topically 2 (two) times daily as needed.          REVIEW OF SYSTEMS: A comprehensive ROS was conducted with the patient and is negative except as per HPI and below:  ROS     OBJECTIVE:  VS: There were no vitals taken for this visit.   Wt Readings from Last 3 Encounters:  08/14/22 198 lb (89.8 kg)  06/25/22 199 lb 4.8 oz (90.4 kg)  05/13/22 197 lb (89.4 kg)     EXAM: General: Pt appears well and is in NAD  Eyes: External eye exam normal without stare, lid lag or exophthalmos.  EOM intact.  PERRL.  Neck: General: Supple without adenopathy. Thyroid: Thyroid size normal.  No goiter or nodules appreciated. No thyroid bruit.  Lungs: Clear with good BS bilat with no rales, rhonchi, or wheezes  Heart: Auscultation: RRR.  Abdomen: Normoactive bowel sounds, soft, nontender, without masses or organomegaly palpable  Extremities:  BL LE: No pretibial edema normal ROM and strength.  Mental Status: Judgment, insight: Intact Orientation: Oriented to time, place, and person Mood and affect: No depression, anxiety, or agitation     DATA REVIEWED: ***   Brain MRI 02/09/2022  Brain: No acute infarction, hemorrhage, hydrocephalus or extra-axial collection.   Pituitary/Sella: The sella is enlarged and partially empty. Convex contour of the posterior aspect of the pituitary gland on the left with focal area of hypoenhancement measuring approximately 3 x 3 mm (series 13, image) may represent a pituitary adenoma. The infundibulum is mildly deviated to the left. The hypothalamus and mamillary bodies are  normal. There is no mass effect on the optic chiasm or optic nerves. The infundibular and chiasmatic recesses are clear. Normal cavernous sinus and cavernous internal carotid artery flow voids.   Vascular: Normal flow voids.   Skull and upper cervical spine: Normal marrow signal.   Sinuses/Orbits: Negative.   Other: None.   IMPRESSION: A 3 mm hypoenhancing focus within the left side of the pituitary gland posteriorly, may represent a microadenoma.   ASSESSMENT/PLAN/RECOMMENDATIONS:   Pituitary microadenoma    Medications :   2.  Hyperprolactinemia:  Signed electronically by: Mack Guise, MD  [facility] Endocrinology  [facility] [address] [place], [place] [address] Phone: [phone] FAX: [phone]   CC: [place] [address] [place] [address] Phone: [phone] Fax: [phone]   Return to Endocrinology clinic as below: Future  Appointments  Date Time Provider [place]  09/07/2022  9:30 AM Patty Leitzke, Melanie Crazier, MD [facility] None

## 2023-03-03 ENCOUNTER — Ambulatory Visit: Payer: Medicaid Other | Admitting: Obstetrics & Gynecology

## 2023-03-31 ENCOUNTER — Ambulatory Visit: Payer: Medicaid Other | Admitting: Advanced Practice Midwife

## 2023-04-06 ENCOUNTER — Other Ambulatory Visit: Payer: Self-pay | Admitting: Internal Medicine

## 2023-04-07 LAB — COMPLETE METABOLIC PANEL WITH GFR
AG Ratio: 1.5 (calc) (ref 1.0–2.5)
ALT: 9 U/L (ref 6–29)
AST: 12 U/L (ref 10–30)
Albumin: 4.1 g/dL (ref 3.6–5.1)
Alkaline phosphatase (APISO): 68 U/L (ref 31–125)
BUN: 9 mg/dL (ref 7–25)
CO2: 22 mmol/L (ref 20–32)
Calcium: 8.8 mg/dL (ref 8.6–10.2)
Chloride: 106 mmol/L (ref 98–110)
Creat: 0.85 mg/dL (ref 0.50–0.97)
Globulin: 2.7 g/dL (calc) (ref 1.9–3.7)
Glucose, Bld: 82 mg/dL (ref 65–99)
Potassium: 4 mmol/L (ref 3.5–5.3)
Sodium: 138 mmol/L (ref 135–146)
Total Bilirubin: 0.4 mg/dL (ref 0.2–1.2)
Total Protein: 6.8 g/dL (ref 6.1–8.1)
eGFR: 91 mL/min/{1.73_m2} (ref >=60)

## 2023-04-07 LAB — CBC
HCT: 36.3 % (ref 35.0–45.0)
Hemoglobin: 11.8 g/dL (ref 11.7–15.5)
MCH: 27.1 pg (ref 27.0–33.0)
MCHC: 32.5 g/dL (ref 32.0–36.0)
MCV: 83.4 fL (ref 80.0–100.0)
MPV: 10.9 fL (ref 7.5–12.5)
Platelets: 272 10*3/uL (ref 140–400)
RBC: 4.35 10*6/uL (ref 3.80–5.10)
RDW: 13.3 % (ref 11.0–15.0)
WBC: 7.6 10*3/uL (ref 3.8–10.8)

## 2023-04-07 LAB — URINE CULTURE
MICRO NUMBER:: 14862159
SPECIMEN QUALITY:: ADEQUATE

## 2023-04-07 LAB — VITAMIN D 25 HYDROXY (VIT D DEFICIENCY, FRACTURES): Vit D, 25-Hydroxy: 29 ng/mL — ABNORMAL LOW (ref 30–100)

## 2023-04-07 LAB — LIPID PANEL
Cholesterol: 150 mg/dL (ref <200)
HDL: 70 mg/dL (ref >=50)
LDL Cholesterol (Calc): 66 mg/dL (calc)
Non-HDL Cholesterol (Calc): 80 mg/dL (calc) (ref <130)
Total CHOL/HDL Ratio: 2.1 (calc) (ref <5.0)
Triglycerides: 61 mg/dL (ref <150)

## 2023-04-13 ENCOUNTER — Encounter: Payer: Self-pay | Admitting: Obstetrics & Gynecology

## 2023-04-14 ENCOUNTER — Other Ambulatory Visit: Payer: Self-pay

## 2023-04-14 ENCOUNTER — Emergency Department (HOSPITAL_COMMUNITY)
Admission: EM | Admit: 2023-04-14 | Discharge: 2023-04-15 | Disposition: A | Discharge status: Home / Self Care | Payer: Medicaid Other | Attending: Emergency Medicine | Admitting: Emergency Medicine

## 2023-04-14 ENCOUNTER — Encounter (HOSPITAL_COMMUNITY): Payer: Self-pay

## 2023-04-14 ENCOUNTER — Emergency Department (HOSPITAL_COMMUNITY): Payer: Medicaid Other

## 2023-04-14 DIAGNOSIS — Z91010 Allergy to peanuts: Secondary | ICD-10-CM | POA: Insufficient documentation

## 2023-04-14 DIAGNOSIS — M79601 Pain in right arm: Secondary | ICD-10-CM | POA: Diagnosis not present

## 2023-04-14 DIAGNOSIS — R0789 Other chest pain: Secondary | ICD-10-CM

## 2023-04-14 DIAGNOSIS — R0602 Shortness of breath: Secondary | ICD-10-CM | POA: Insufficient documentation

## 2023-04-14 DIAGNOSIS — Z91040 Latex allergy status: Secondary | ICD-10-CM | POA: Insufficient documentation

## 2023-04-14 LAB — CBC
HCT: 36.1 % (ref 36.0–46.0)
Hemoglobin: 11.7 g/dL — ABNORMAL LOW (ref 12.0–15.0)
MCH: 27.6 pg (ref 26.0–34.0)
MCHC: 32.4 g/dL (ref 30.0–36.0)
MCV: 85.1 fL (ref 80.0–100.0)
Platelets: 294 10*3/uL (ref 150–400)
RBC: 4.24 MIL/uL (ref 3.87–5.11)
RDW: 13.5 % (ref 11.5–15.5)
WBC: 8.6 10*3/uL (ref 4.0–10.5)
nRBC: 0 % (ref 0.0–0.2)

## 2023-04-14 LAB — BASIC METABOLIC PANEL
Anion gap: 9 (ref 5–15)
BUN: 9 mg/dL (ref 6–20)
CO2: 23 mmol/L (ref 22–32)
Calcium: 8.7 mg/dL — ABNORMAL LOW (ref 8.9–10.3)
Chloride: 106 mmol/L (ref 98–111)
Creatinine, Ser: 0.95 mg/dL (ref 0.44–1.00)
GFR, Estimated: >60 mL/min (ref >60)
Glucose, Bld: 99 mg/dL (ref 70–99)
Potassium: 3.7 mmol/L (ref 3.5–5.1)
Sodium: 138 mmol/L (ref 135–145)

## 2023-04-14 LAB — TROPONIN I (HIGH SENSITIVITY): Troponin I (High Sensitivity): <2 ng/L (ref <18)

## 2023-04-14 LAB — I-STAT BETA HCG BLOOD, ED (MC, WL, AP ONLY): I-stat hCG, quantitative: <5 m[IU]/mL (ref <5)

## 2023-04-14 LAB — PROTIME-INR
INR: 1.2 (ref 0.8–1.2)
Prothrombin Time: 14.8 seconds (ref 11.4–15.2)

## 2023-04-14 NOTE — ED Triage Notes (Addendum)
Pt arrives from home via POV c/o right sided chest pain that began 04/18 that wraps around upper right side and into right shoulder. Pt states that it hurts 6/10. Hurts more when she takes a deep breath and this makes her short of breath.

## 2023-04-15 ENCOUNTER — Emergency Department (HOSPITAL_COMMUNITY): Payer: Medicaid Other

## 2023-04-15 LAB — HEPATIC FUNCTION PANEL
ALT: 10 U/L (ref 0–44)
AST: 12 U/L — ABNORMAL LOW (ref 15–41)
Albumin: 3.3 g/dL — ABNORMAL LOW (ref 3.5–5.0)
Alkaline Phosphatase: 59 U/L (ref 38–126)
Bilirubin, Direct: <0.1 mg/dL (ref 0.0–0.2)
Total Bilirubin: 0.5 mg/dL (ref 0.3–1.2)
Total Protein: 6.5 g/dL (ref 6.5–8.1)

## 2023-04-15 LAB — TROPONIN I (HIGH SENSITIVITY): Troponin I (High Sensitivity): <2 ng/L (ref <18)

## 2023-04-15 MED ORDER — ALBUTEROL SULFATE HFA 108 (90 BASE) MCG/ACT IN AERS
2.0000 | INHALATION_SPRAY | Freq: Once | RESPIRATORY_TRACT | Status: AC
  Administered 2023-04-15: 2 via RESPIRATORY_TRACT
  Filled 2023-04-15: qty 6.7

## 2023-04-15 MED ORDER — KETOROLAC TROMETHAMINE 30 MG/ML IJ SOLN
15.0000 mg | Freq: Once | INTRAMUSCULAR | Status: AC
  Administered 2023-04-15: 15 mg via INTRAVENOUS
  Filled 2023-04-15: qty 1

## 2023-04-15 MED ORDER — CYCLOBENZAPRINE HCL 10 MG PO TABS: 10.0000 mg | ORAL_TABLET | Freq: Two times a day (BID) | ORAL | 0 refills | Status: AC | PRN

## 2023-04-15 MED ORDER — IOHEXOL 350 MG/ML SOLN
75.0000 mL | Freq: Once | INTRAVENOUS | Status: AC | PRN
Start: 2023-04-15 — End: 2023-04-15
  Administered 2023-04-15: 75 mL via INTRAVENOUS

## 2023-04-15 NOTE — ED Provider Notes (Signed)
[place] EMERGENCY DEPARTMENT AT [facility] Provider Note   CSN: [id number] Arrival date & time: 04/14/23  2153     History  Chief Complaint  Patient presents with   Chest Pain    Kathryn Suarez is a 37 y.o. female.  The history is provided by the patient.  Chest Pain Kathryn Suarez is a 37 y.o. female who presents to the Emergency Department complaining of chest pain.  She presents emergency department for evaluation of right-sided chest pain that started on April 19.  Pain is located in the right upper anterior chest and radiates to her back as well as her right shoulder.  Pain is described as a shortness that is worse with breathing.  She does have some shortness of breath, particularly on exertion.  No reported injuries.  She also has pain with movement of her chest and right arm.  No fevers, cough, abdominal pain, nausea, vomiting.  She is left-hand dominant.  She does have a Mirena IUD.  She is a non-smoker.  No recent surgeries.     Home Medications Prior to Admission medications   Medication Sig Start Date End Date Taking? Authorizing Provider  cyclobenzaprine (FLEXERIL) 10 MG tablet Take 1 tablet (10 mg total) by mouth 2 (two) times daily as needed for muscle spasms. 04/15/23  Yes Tilden Fossa, MD  cabergoline (DOSTINEX) 0.5 MG tablet Take 0.5 tablets (0.25 mg total) by mouth 2 (two) times a week. 05/14/22   Adam Phenix, MD  cetirizine (ZYRTEC) 10 MG tablet SMARTSIG:1 Tablet(s) By Mouth Every Evening PRN 04/03/21   [provider]  ELDERBERRY PO Take by mouth.    [provider]  hydrocortisone 2.5 % cream Apply to affected skin of face/neck/groin 1-2 times daily as needed for itching/rash. 08/04/21   [provider]  hydrOXYzine (ATARAX) 25 MG tablet Take 25 mg by mouth 2 (two) times daily as needed. 04/10/22   [provider]  ibuprofen (ADVIL) 800 MG tablet Take 1 tablet (800 mg total) by mouth every 8 (eight) hours as  needed. 01/04/20   Adam Phenix, MD  levonorgestrel (MIRENA) 20 MCG/DAY IUD by Intrauterine route. 06/23/21   [provider]  LORazepam (ATIVAN) 1 MG tablet Take by mouth. 02/09/22   [provider]  medroxyPROGESTERone (PROVERA) 10 MG tablet Take 1 tablet (10 mg total) by mouth daily. 06/11/21   Levie Heritage, DO  Melatonin 1 MG CAPS Take by mouth.    [provider]  norethindrone-ethinyl estradiol 1/35 (ORTHO-NOVUM) tablet Take 1 tablet by mouth daily. 2 tablets daily until bleeding stops then one a day 06/25/22   Adam Phenix, MD  norgestimate-ethinyl estradiol (ORTHO-CYCLEN) 0.25-35 MG-MCG tablet Take 1 tablet by mouth daily. 05/13/22   Adam Phenix, MD  omeprazole (PRILOSEC) 20 MG capsule Take 1 capsule (20 mg total) by mouth daily. 08/14/22   Rolan Bucco, MD  ondansetron (ZOFRAN-ODT) 4 MG disintegrating tablet 4mg  ODT q4 hours prn nausea/vomit 08/14/22   Rolan Bucco, MD  oxyCODONE-acetaminophen (PERCOCET/ROXICET) 5-325 MG tablet Take 3 tablets by mouth every 6 (six) hours as needed for severe pain (gets this at the pain clinic).    [provider]  polyethylene glycol (MIRALAX) 17 g packet Take 17 g by mouth daily. 03/22/20   Adam Phenix, MD  PROZAC 20 MG capsule Take 20 mg by mouth every morning. 05/28/22   [provider]  topiramate (TOPAMAX) 50 MG tablet  01/30/21  [provider]  triamcinolone cream (KENALOG) 0.5 % Apply topically 2 (two) times daily as needed. 04/03/21   [provider]      Allergies    Latex, Mushroom extract complex, and Peanut butter flavor    Review of Systems   Review of Systems  Cardiovascular:  Positive for chest pain.  All other systems reviewed and are negative.   Physical Exam Updated Vital Signs BP 125/77   Pulse 66   Temp 98 F (36.7 C) (Oral)   Resp 13   Ht 5\' 3"  (1.6 m)   Wt 81.6 kg   SpO2 99%   BMI 31.89 kg/m  Physical Exam Vitals and nursing note reviewed.   Constitutional:      Appearance: She is well-developed.  HENT:     Head: Normocephalic and atraumatic.  Cardiovascular:     Rate and Rhythm: Normal rate and regular rhythm.     Heart sounds: No murmur heard. Pulmonary:     Effort: Pulmonary effort is normal. No respiratory distress.     Breath sounds: Normal breath sounds.     Comments: Tender to palpation throughout the right chest wall, right upper back as well as right shoulder without any definite soft tissue swelling or edema. Abdominal:     Palpations: Abdomen is soft.     Tenderness: There is no abdominal tenderness. There is no guarding or rebound.  Musculoskeletal:        General: No tenderness.     Comments: 2+ radial pulses bilaterally.  Skin:    General: Skin is warm and dry.  Neurological:     Mental Status: She is alert and oriented to person, place, and time.     Comments: 5 out of 5 grip strength in bilateral upper extremities  Psychiatric:        Behavior: Behavior normal.     ED Results / Procedures / Treatments   Labs (all labs ordered are listed, but only abnormal results are displayed) Labs Reviewed  BASIC METABOLIC PANEL - Abnormal; Notable for the following components:      Result Value   Calcium 8.7 (*)    All other components within normal limits  CBC - Abnormal; Notable for the following components:   Hemoglobin 11.7 (*)    All other components within normal limits  HEPATIC FUNCTION PANEL - Abnormal; Notable for the following components:   Albumin 3.3 (*)    AST 12 (*)    All other components within normal limits  PROTIME-INR  I-STAT BETA HCG BLOOD, ED ([facility], [facility], [facility] ONLY)  TROPONIN I (HIGH SENSITIVITY)  TROPONIN I (HIGH SENSITIVITY)    EKG EKG Interpretation  Date/Time:  Wednesday Apr 14 2023 22:07:02 EDT Ventricular Rate:  80 PR Interval:  166 QRS Duration: 78 QT Interval:  382 QTC Calculation: 440 R Axis:   78 Text Interpretation: Normal sinus rhythm Low voltage QRS Borderline ECG  Confirmed by Tilden Fossa [phone]) on 04/15/2023 12:27:50 AM  Radiology CT Angio Chest PE W/Cm &/Or Wo Cm  Result Date: 04/15/2023 CLINICAL DATA:  Pulmonary embolism (PE) suspected, high prob, right chest pain EXAM: CT ANGIOGRAPHY CHEST WITH CONTRAST TECHNIQUE: Multidetector CT imaging of the chest was performed using the standard protocol during bolus administration of intravenous contrast. Multiplanar CT image reconstructions and MIPs were obtained to evaluate the vascular anatomy. RADIATION DOSE REDUCTION: This exam was performed according to the departmental dose-optimization program which includes automated exposure control, adjustment of the mA and/or kV according to  patient size and/or use of iterative reconstruction technique. CONTRAST:  75mL OMNIPAQUE IOHEXOL 350 MG/ML SOLN COMPARISON:  None Available. FINDINGS: Cardiovascular: Adequate opacification of the pulmonary arterial tree. No intraluminal filling defect identified to suggest acute pulmonary embolism. Central pulmonary arteries are of normal caliber. No significant coronary artery calcification. Cardiac size within normal limits. No pericardial effusion. No significant atherosclerotic calcification within the thoracic aorta. No aortic aneurysm. Mediastinum/Nodes: No enlarged mediastinal, hilar, or axillary lymph nodes. Thyroid gland, trachea, and esophagus demonstrate no significant findings. Lungs/Pleura: Lungs are clear. No pleural effusion or pneumothorax. Upper Abdomen: No acute abnormality. Musculoskeletal: No chest wall abnormality. No acute or significant osseous findings. Review of the MIP images confirms the above findings. IMPRESSION: 1. No pulmonary embolism. No acute intrathoracic pathology identified. Electronically Signed   By: Helyn Numbers M.D.   On: 04/15/2023 02:29   DG Chest 2 View  Result Date: 04/14/2023 CLINICAL DATA:  Right-sided chest pain radiating to the right arm, with arm weakness. EXAM: CHEST - 2 VIEW COMPARISON:   PA and lateral 10/22/2021 FINDINGS: The heart size and mediastinal contours are within normal limits. Both lungs are clear. The visualized skeletal structures are unremarkable. IMPRESSION: No active cardiopulmonary disease.  Stable chest. Electronically Signed   By: Almira Bar M.D.   On: 04/14/2023 22:41    Procedures Procedures    Medications Ordered in ED Medications  ketorolac (TORADOL) 30 MG/ML injection 15 mg (15 mg Intravenous Given 04/15/23 0147)  albuterol (VENTOLIN HFA) 108 (90 Base) MCG/ACT inhaler 2 puff (2 puffs Inhalation Given 04/15/23 0211)  iohexol (OMNIPAQUE) 350 MG/ML injection 75 mL (75 mLs Intravenous Contrast Given 04/15/23 0210)    ED Course/ Medical Decision Making/ A&P                             Medical Decision Making Amount and/or Complexity of Data Reviewed Labs: ordered. Radiology: ordered.  Risk Prescription drug management.   Patient here for evaluation of progressive pleuritic chest pain that started on April 18 or 19.  She does have tenderness on examination.  No evidence of soft tissue infection on examination.  Presentation is not consistent with ACS, troponins negative x 2.  Doubt acute referred biliary pathology-hepatic function is within normal limits and she has no reproducible abdominal tenderness on examination.  Given pleuritic nature of her symptoms a CTA was obtained, which is negative for PE, pneumonia or dissection.  Discussed with patient unclear source of her pain.  Will change her tizanidine to Flexeril for possible muscle spasm component.  She is on Percocet already at home.  Recommend adding naproxen.  Plan to discharge with PCP follow-up and return precautions.        Final Clinical Impression(s) / ED Diagnoses Final diagnoses:  Chest wall pain    Rx / DC Orders ED Discharge Orders          Ordered    cyclobenzaprine (FLEXERIL) 10 MG tablet  2 times daily PRN        04/15/23 0323              Tilden Fossa,  MD 04/15/23 [phone]

## 2023-06-08 ENCOUNTER — Ambulatory Visit: Payer: Medicaid Other | Admitting: Family Medicine

## 2023-07-06 ENCOUNTER — Ambulatory Visit: Payer: Self-pay | Admitting: Allergy & Immunology

## 2023-08-12 ENCOUNTER — Ambulatory Visit: Payer: MEDICAID | Admitting: Allergy & Immunology

## 2023-09-14 ENCOUNTER — Other Ambulatory Visit: Payer: Self-pay

## 2023-09-14 ENCOUNTER — Ambulatory Visit (INDEPENDENT_AMBULATORY_CARE_PROVIDER_SITE_OTHER): Payer: MEDICAID | Admitting: Allergy & Immunology

## 2023-09-14 ENCOUNTER — Encounter: Payer: Self-pay | Admitting: Allergy & Immunology

## 2023-09-14 VITALS — BP 120/84 | HR 77 | Temp 98.0°F | Resp 17 | Ht 63.75 in | Wt 199.7 lb

## 2023-09-14 DIAGNOSIS — J4530 Mild persistent asthma, uncomplicated: Secondary | ICD-10-CM | POA: Diagnosis not present

## 2023-09-14 DIAGNOSIS — J3089 Other allergic rhinitis: Secondary | ICD-10-CM

## 2023-09-14 DIAGNOSIS — J302 Other seasonal allergic rhinitis: Secondary | ICD-10-CM

## 2023-09-14 DIAGNOSIS — L500 Allergic urticaria: Secondary | ICD-10-CM | POA: Diagnosis not present

## 2023-09-14 DIAGNOSIS — K219 Gastro-esophageal reflux disease without esophagitis: Secondary | ICD-10-CM

## 2023-09-14 MED ORDER — LEVOCETIRIZINE DIHYDROCHLORIDE 5 MG PO TABS: 5.0000 mg | ORAL_TABLET | Freq: Two times a day (BID) | ORAL | 1 refills | Status: AC

## 2023-09-14 MED ORDER — FAMOTIDINE 20 MG PO TABS: 20.0000 mg | ORAL_TABLET | Freq: Two times a day (BID) | ORAL | 1 refills | Status: AC

## 2023-09-14 MED ORDER — MONTELUKAST SODIUM 10 MG PO TABS: 10.0000 mg | ORAL_TABLET | Freq: Every day | ORAL | 1 refills | Status: AC

## 2023-09-14 NOTE — Progress Notes (Signed)
NEW PATIENT  Date of Service/Encounter:  09/14/23  Consult requested by: Pa, [facility]   Assessment:   Mild persistent asthma, uncomplicated  Perennial and seasonal allergic rhinitis (ragweed, weeds, trees, indoor molds, and dust mites)  Allergic urticaria - with negative testing to the most common foods  Gastroesophageal reflux disease  Plan/Recommendations:   1. Mild persistent asthma, uncomplicated - Lung testing looked amazing today. - We are not going to make any medication changes. - Daily controller medication(s): NOTHING - Prior to physical activity: albuterol 2 puffs 10-15 minutes before physical activity. - Rescue medications: albuterol 4 puffs every 4-6 hours as needed - Asthma control goals:  * Full participation in all desired activities (may need albuterol before activity) * Albuterol use two time or less a week on average (not counting use with activity) * Cough interfering with sleep two time or less a month * Oral steroids no more than once a year * No hospitalizations  2. Seasonal and perennial allergic rhinitis - Testing today showed: ragweed, weeds, trees, indoor molds, and dust mites - Copy of test results provided.  - Avoidance measures provided. - Stop taking: current medications - Start taking: Xyzal (levocetirizine) 5mg  tablet TWICE daily and Singulair (montelukast) 10mg  daily - You can use an extra dose of the antihistamine, if needed, for breakthrough symptoms.  - Consider nasal saline rinses 1-2 times daily to remove allergens from the nasal cavities as well as help with mucous clearance (this is especially helpful to do before the nasal sprays are given) - Consider allergy shots as a means of long-term control. - Allergy shots "re-train" and "reset" the immune system to ignore environmental allergens and decrease the resulting immune response to those allergens (sneezing, itchy watery eyes, runny nose, nasal congestion, etc).    - Allergy  shots improve symptoms in 75-85% of patients.  - We can discuss more at the next appointment if the medications are not working for you.  3. Allergic urticaria - Testing was negative to the most common foods, but you did have some triggers on your environmental allergy testing. - Your history does not have any "red flags" such as fevers, joint pains, or permanent skin changes that would be concerning for a more serious cause of hives.  - We will get some labs to rule out serious causes of hives: alpha gal panel, complete blood count, tryptase level, chronic urticaria panel, CMP, ESR, and CRP. - Chronic hives are often times a self limited process and will "burn themselves out" over 6-12 months, although this is not always the case.  - In the meantime, start suppressive dosing of antihistamines:   - Morning: Xyzal (levocetirizine) 5mg  + Pepcid (famotidine) 20mg   - Evening: Xyzal (levocetirizine) 5mg  + Pepcid (famotidine) 20mg  + Singulair (montelukast) 10mg  - You can change this dosing at home, decreasing the dose as needed or increasing the dosing as needed.  - If you are not tolerating the medications or are tired of taking them every day, we can start treatment with a monthly injectable medication called Xolair.   4. Gastroesophageal reflux disease - Continue with omeprazole as needed. - We are not adding the Pepcid twice a day as well for the hives, which might also help with the reflux.  5. Return in about 2 months (around 11/14/2023). You can have the follow up appointment with Kathryn Suarez or a Nurse Practicioner (our Nurse Practitioners are excellent and always have Physician oversight!). You can try to coordinate this with Kathryn Suarez's  follow up visit if you want! Ok to [employer] on my schedule.    This note in its entirety was forwarded to the Provider who requested this consultation.  Subjective:   Kathryn Suarez is a 37 y.o. female presenting today for evaluation of  Chief  Complaint  Patient presents with   Urticaria    Kathryn Suarez has a history of the following: Patient Active Problem List   Diagnosis Date Noted   Seasonal and perennial allergic rhinitis 09/14/2023   Allergic urticaria 09/14/2023   Gastroesophageal reflux disease 09/14/2023   Mild persistent asthma, uncomplicated 09/14/2023   Pituitary microadenoma with hyperprolactinemia (HCC) 05/13/2022   Ovarian cyst 07/16/2021   Sickle cell trait (HCC) 12/01/2020   Endometriosis    Morbid obesity (HCC) 11/30/2017   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   Chronic pelvic pain in female 10/02/2011    History obtained from: chart review and patient.  Discussed the use of AI scribe software for clinical note transcription with the patient, who gave verbal consent to proceed.  Kathryn Suarez was referred by Pa, [facility].     Kathryn Suarez is a 37 y.o. female presenting for an evaluation of urticaria as well as asthma and allergies .The patient, with a history of asthma and allergies, presents with concerns about their asthma and a desire to identify their allergens.     Asthma/Respiratory Symptom History: They were diagnosed with asthma in their youth, which was associated with panic attacks and severe breathing difficulties. The condition improved over time, and they currently use an albuterol inhaler as needed. They report that their asthma symptoms have significantly improved since losing 92 pounds.  Allergic Rhinitis Symptom History: In addition to asthma and skin reactions, the patient has been experiencing environmental allergies, particularly during warmer months. These allergies present as sneezing and a runny nose, and are managed with cetirizine as needed.  Although winter might be better for her allergies, it is worse for her urticaria.  Food Allergy Symptom History: She tolerates all the major food allergens without adverse event including  peanuts, tree nuts, wheat, milk, and seafood.  There is no particular trigger for her symptoms.  Skin Symptom History: The patient also reports a history of skin reactions, including hives and white spots, which they have been documenting with photos and videos. These reactions have been occurring for years and seem to be triggered by certain foods and prolonged outdoor exposure. The reactions typically last for three to four days before disappearing. They also report associated symptoms of vomiting and coughing during these episodes.  There is no particularly good time of the year for her symptoms.  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  It also results and changes on her tongue on the left side of the body as well as some involvement of her throat.  She was offered lasering at some point to remove this, but she was nervous about it and never went through with it.  She does not remember the name of the [facility] dermatologist she saw since she was a child at that time.  GERD Symptom History: The patient also reports occasional reflux, managed with omeprazole as needed.   Otherwise, there is no history of other atopic diseases, including drug allergies, stinging insect allergies, or contact dermatitis. There is no significant infectious history. Vaccinations are up to date.    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 If you have any questions, ask your nurse or doctor.          cabergoline 0.5 MG tablet Commonly known as: DOSTINEX Take 0.5 tablets (0.25 mg total) by mouth 2 (two) times a week.   cetirizine 10 MG tablet Commonly known as: ZYRTEC SMARTSIG:1 Tablet(s) By Mouth Every Evening PRN   cyclobenzaprine 10 MG tablet Commonly known as: FLEXERIL Take 1 tablet (10 mg total) by mouth 2 (two) times daily as needed for muscle spasms.   ELDERBERRY PO Take by mouth.   hydrocortisone 2.5 % cream Apply to affected skin of face/neck/groin 1-2 times daily as needed for itching/rash.   hydrOXYzine 25 MG tablet Commonly known as: ATARAX Take 25 mg by mouth 2 (two) times daily as needed.   ibuprofen 800 MG tablet Commonly known as: ADVIL Take 1 tablet (800 mg total) by mouth every 8 (eight) hours as needed.   levonorgestrel 20 MCG/DAY Iud Commonly known as: MIRENA by Intrauterine route.   LORazepam 1 MG tablet Commonly known as: ATIVAN Take by mouth.   medroxyPROGESTERone 10 MG tablet Commonly known as: PROVERA Take 1 tablet (10 mg total) by mouth daily.   Melatonin 1 MG Caps Take by mouth.   norethindrone-ethinyl estradiol 1/35 tablet Commonly known as: ORTHO-NOVUM Take 1 tablet by mouth daily. 2 tablets daily until bleeding stops then one a day   norgestimate-ethinyl estradiol 0.25-35 MG-MCG tablet Commonly known as: ORTHO-CYCLEN Take 1 tablet by mouth daily.   omeprazole 20 MG capsule Commonly known as: PRILOSEC Take 1 capsule (20 mg total) by mouth daily.   ondansetron 4 MG disintegrating tablet Commonly known as: ZOFRAN-ODT 4mg  ODT q4 hours prn nausea/vomit   oxyCODONE-acetaminophen 5-325 MG tablet Commonly known as:  PERCOCET/ROXICET Take 3 tablets by mouth every 6 (six) hours as needed for severe pain (gets this at the pain clinic).   polyethylene glycol 17 g packet Commonly known as: MiraLax Take 17 g by mouth daily.   PROzac 20 MG capsule Generic drug: FLUoxetine Take 20 mg by mouth every morning.   topiramate 50 MG tablet Commonly known as: TOPAMAX   triamcinolone cream 0.5 % Commonly known as: KENALOG Apply topically 2 (two) times daily as needed.        Birth History: non-contributory  Developmental History: Jinna hnon-contributory  Past Surgical History: Past Surgical History:  Procedure Laterality Date   DILATION AND CURETTAGE OF UTERUS  03/23/2007   EXCISION OF TONGUE LESION     LAPAROSCOPIC OOPHERECTOMY Left 08/2019   in [place]   LAPAROSCOPY N/A 06/01/2018   Procedure: LAPAROSCOPY DIAGNOSTIC, PERITONEAL BIOPSY;  Surgeon: Allie Bossier, MD;  Location: [facility];  Service: Gynecology;  Laterality: N/A;     Family History: Family History  Problem Relation Age of Onset   Urticaria Mother    Eczema Mother    Asthma Mother    Hypertension Mother    Sickle cell trait Father    Asthma Brother    Eczema Brother      Social History: [place] lives at home with her family.  They live in a house that is 36 years old.  There is carpeting in the main living areas as well as the bedroom.  They have gas and electric heating with central cooling.  There are no animals inside or outside of the home.  There are no dust mite covers on the bedding.  There is no tobacco exposure.  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  She is exposed to fumes, chemicals, and dust in her work environment.  She does have a HEPA filter in her home.  She does not like going interstate or industrial area.   Review of systems otherwise negative other than that mentioned in the HPI.    Objective:   Blood pressure 120/84, pulse 77, temperature 98 F (36.7 C),  temperature source Temporal, resp. rate 17, height 5' 3.75" (1.619 m), weight 199 lb 11.2 oz (90.6 kg), SpO2 98%. Body mass index is 34.55 kg/m.     Physical Exam Vitals reviewed.  Constitutional:      Appearance: She is well-developed.     Comments: Lovely.  Talkative.  HENT:     Head: Normocephalic and atraumatic.     Right Ear: Tympanic membrane, ear canal and external ear normal. No drainage, swelling or tenderness. Tympanic membrane is not injected, scarred, erythematous, retracted or bulging.     Left Ear: Tympanic membrane, ear canal and external ear normal. No drainage, swelling or tenderness. Tympanic membrane is not injected, scarred, erythematous, retracted or bulging.     Nose: Mucosal edema and rhinorrhea present. No nasal deformity or septal deviation.     Right Turbinates: Enlarged, swollen and pale.     Left Turbinates: Enlarged, swollen and pale.     Right Sinus: No maxillary sinus tenderness or frontal sinus tenderness.     Left Sinus: No maxillary sinus tenderness or frontal sinus tenderness.     Mouth/Throat:     Mouth: Mucous membranes are not pale and not dry.     Pharynx: Uvula midline.  Eyes:     General: Lids are normal. Allergic shiner present.        Right eye: No discharge.        Left eye: No discharge.     Conjunctiva/sclera: Conjunctivae normal.     Right eye: Right conjunctiva is not injected. No chemosis.    Left eye: Left conjunctiva is not injected. No chemosis.    Pupils: Pupils are equal, round, and reactive to light.  Cardiovascular:     Rate and Rhythm: Normal rate and regular rhythm.     Heart sounds: Normal heart sounds.  Pulmonary:     Effort: Pulmonary effort is normal. No tachypnea, accessory muscle usage or respiratory distress.     Breath sounds: Normal breath sounds. No wheezing, rhonchi or rales.  Chest:     Chest wall: No tenderness.  Abdominal:     Tenderness: There is no abdominal tenderness. There is no guarding or rebound.   Lymphadenopathy:     Head:     Right side of head: No  submandibular, tonsillar or occipital adenopathy.     Left side of head: No submandibular, tonsillar or occipital adenopathy.     Cervical: No cervical adenopathy.  Skin:    General: Skin is warm.     Capillary Refill: Capillary refill takes less than 2 seconds.     Coloration: Skin is not pale.     Findings: No abrasion, erythema, petechiae or rash. Rash is not papular, urticarial or vesicular.     Comments: Birthmark present in the middle of the face extending to the nasal bridge and involving mostly the left side of the face.  It does extend onto the neck as well.  Neurological:     Mental Status: She is alert.  Psychiatric:        Behavior: Behavior is cooperative.      Diagnostic studies:    Spirometry: results normal (FEV1: 2.41/91%, FVC: 2.94/92%, FEV1/FVC: 82%).    Spirometry consistent with normal pattern.   Allergy Studies:     Airborne Adult Perc - 09/14/23 0950     Time Antigen Placed 0930    Allergen Manufacturer Waynette Buttery    Location Back    Number of Test 55    1. Control-Buffer 50% Glycerol Negative    2. Control-Histamine 2+    3. Bahia Negative    4. French Southern Territories Negative    5. Johnson Negative    6. Kentucky Blue Negative    7. Meadow Fescue Negative    8. Perennial Rye Negative    9. Timothy Negative    10. Ragweed Mix Negative    11. Cocklebur Negative    12. Plantain,  English Negative    13. Baccharis Negative    14. Dog Fennel Negative    15. Russian Thistle Negative    16. Lamb's Quarters Negative    17. Sheep Sorrell Negative    18. Rough Pigweed 2+    19. Marsh Elder, Rough 2+    20. Mugwort, Common Negative    21. Box, Elder Negative    22. Cedar, red Negative    23. Sweet Gum Negative    24. Pecan Pollen Negative    25. Pine Mix Negative    26. Walnut, Black Pollen Negative    27. Red Mulberry Negative    28. Ash Mix Negative    29. Birch Mix Negative    30. Beech American Negative     31. Cottonwood, Guinea-Bissau Negative    32. Hickory, White Negative    33. Maple Mix Negative    34. Oak, Guinea[place] Mix Negative    35. Sycamore Eastern Negative    36. Alternaria Alternata Negative    37. Cladosporium Herbarum Negative    38. Aspergillus Mix Negative    39. Penicillium Mix Negative    40. Bipolaris Sorokiniana (Helminthosporium) Negative    41. Drechslera Spicifera (Curvularia) Negative    42. Mucor Plumbeus Negative    43. Fusarium Moniliforme Negative    44. Aureobasidium Pullulans (pullulara) Negative    45. Rhizopus Oryzae Negative    46. Botrytis Cinera Negative    47. Epicoccum Nigrum Negative    48. Phoma Betae Negative    49. Dust Mite Mix 2+    50. Cat Hair 10,000 BAU/ml Negative    51.  Dog Epithelia Negative    52. Mixed Feathers Negative    53. Horse Epithelia Negative    54. Cockroach, German Negative    55. Tobacco Leaf Negative  Intradermal - 09/14/23 0900     Time Antigen Placed 1610    Allergen Manufacturer Waynette Buttery    Location Back    Number of Test 14    Control Negative    Bahia Negative    French Southern Territories Negative    Johnson Negative    7 Grass Negative    Ragweed Mix 4+    Tree Mix 1+    Mold 1 Negative    Mold 2 Negative    Mold 3 Negative    Mold 4 3+    Cat Negative    Dog Negative    Cockroach Negative             Food Adult Perc - 09/14/23 0900     Time Antigen Placed 0930    Allergen Manufacturer Waynette Buttery    Location Back    Number of allergen test 27     Control-buffer 50% Glycerol Negative    Control-Histamine 2+    1. Peanut Negative    2. Soybean Negative    3. Wheat Negative    4. Sesame Negative    5. Milk, Cow Negative    6. Casein Negative    7. Egg White, Chicken Negative    8. Shellfish Mix Negative    9. Fish Mix Negative    10. Cashew Negative    11. Walnut Food Negative    12. Almond Negative    13. Hazelnut Negative    14. Pecan Food Negative    15. Pistachio Negative    16. [place]  Nut Negative    17. Coconut Negative    18. Trout Negative    19. Tuna Negative    20. Salmon Negative    21. Flounder Negative    22. Codfish Negative    23. Shrimp Negative    24. Crab Negative    25. Lobster Negative    26. Oyster Negative    27. Scallops Negative             Allergy testing results were read and interpreted by myself, documented by clinical staff.         Malachi Bonds, MD [facility]

## 2023-09-14 NOTE — Patient Instructions (Addendum)
1. Mild persistent asthma, uncomplicated - Lung testing looked amazing today. - We are not going to make any medication changes. - Daily controller medication(s): NOTHING - Prior to physical activity: albuterol 2 puffs 10-15 minutes before physical activity. - Rescue medications: albuterol 4 puffs every 4-6 hours as needed - Asthma control goals:  * Full participation in all desired activities (may need albuterol before activity) * Albuterol use two time or less a week on average (not counting use with activity) * Cough interfering with sleep two time or less a month * Oral steroids no more than once a year * No hospitalizations  2. Seasonal and perennial allergic rhinitis - Testing today showed: ragweed, weeds, trees, indoor molds, and dust mites - Copy of test results provided.  - Avoidance measures provided. - Stop taking: current medications - Start taking: Xyzal (levocetirizine) 5mg  tablet TWICE daily and Singulair (montelukast) 10mg  daily - You can use an extra dose of the antihistamine, if needed, for breakthrough symptoms.  - Consider nasal saline rinses 1-2 times daily to remove allergens from the nasal cavities as well as help with mucous clearance (this is especially helpful to do before the nasal sprays are given) - Consider allergy shots as a means of long-term control. - Allergy shots "re-train" and "reset" the immune system to ignore environmental allergens and decrease the resulting immune response to those allergens (sneezing, itchy watery eyes, runny nose, nasal congestion, etc).    - Allergy shots improve symptoms in 75-85% of patients.  - We can discuss more at the next appointment if the medications are not working for you.  3. Allergic urticaria - Testing was negative to the most common foods, but you did have some triggers on your environmental allergy testing. - Your history does not have any "red flags" such as fevers, joint pains, or permanent skin changes that  would be concerning for a more serious cause of hives.  - We will get some labs to rule out serious causes of hives: alpha gal panel, complete blood count, tryptase level, chronic urticaria panel, CMP, ESR, and CRP. - Chronic hives are often times a self limited process and will "burn themselves out" over 6-12 months, although this is not always the case.  - In the meantime, start suppressive dosing of antihistamines:   - Morning: Xyzal (levocetirizine) 5mg  + Pepcid (famotidine) 20mg   - Evening: Xyzal (levocetirizine) 5mg  + Pepcid (famotidine) 20mg  + Singulair (montelukast) 10mg  - You can change this dosing at home, decreasing the dose as needed or increasing the dosing as needed.  - If you are not tolerating the medications or are tired of taking them every day, we can start treatment with a monthly injectable medication called Xolair.   4. Gastroesophageal reflux disease - Continue with omeprazole as needed. - We are not adding the Pepcid twice a day as well for the hives, which might also help with the reflux.  5. Return in about 2 months (around 11/14/2023). You can have the follow up appointment with Dr. Dellis Anes or a Nurse Practicioner (our Nurse Practitioners are excellent and always have Physician oversight!). You can try to coordinate this with [name]'s follow up visit if you want! Ok to [employer] on my schedule.    Please inform us of any Emergency Department visits, hospitalizations, or changes in symptoms. Call us before going to the ED for breathing or allergy symptoms since we might be able to fit you in for a sick visit. Feel free to contact us anytime  with any questions, problems, or concerns.  It was a pleasure to see you again today!  Websites that have reliable patient information: 1. American Academy of Asthma, Allergy, and Immunology: www.aaaai.org 2. Food Allergy Research and Education (FARE): foodallergy.org 3. Mothers of Asthmatics:  http://www.asthmacommunitynetwork.org 4. American College of Allergy, Asthma, and Immunology: www.acaai.org   COVID-19 Vaccine Information can be found at: [email or web address] For questions related to vaccine distribution or appointments, please email [email or web address] or call [phone].     "Like" [place] on Facebook and Instagram for our latest updates!      A healthy democracy works best when [employer] participate! Make sure you are registered to vote! If you have moved or changed any of your contact information, you will need to get this updated before voting! Scan the QR codes below to learn more!         Airborne Adult Perc - 09/14/23 0950     Time Antigen Placed 0930    Allergen Manufacturer Waynette Buttery    Location Back    Number of Test 55    1. Control-Buffer 50% Glycerol Negative    2. Control-Histamine 2+    3. Bahia Negative    4. French Southern Territories Negative    5. Johnson Negative    6. Kentucky Blue Negative    7. Meadow Fescue Negative    8. Perennial Rye Negative    9. Timothy Negative    10. Ragweed Mix Negative    11. Cocklebur Negative    12. Plantain,  English Negative    13. Baccharis Negative    14. Dog Fennel Negative    15. Russian Thistle Negative    16. Lamb's Quarters Negative    17. Sheep Sorrell Negative    18. Rough Pigweed 2+    19. Marsh Elder, Rough 2+    20. Mugwort, Common Negative    21. Box, Elder Negative    22. Cedar, red Negative    23. Sweet Gum Negative    24. Pecan Pollen Negative    25. Pine Mix Negative    26. Walnut, Black Pollen Negative    27. Red Mulberry Negative    28. Ash Mix Negative    29. Birch Mix Negative    30. Beech American Negative    31. Cottonwood, Guinea-Bissau Negative    32. Hickory, White Negative    33. Maple Mix Negative    34. Oak, [place] Mix Negative    35. Sycamore Eastern Negative    36. Alternaria Alternata Negative    37. Cladosporium Herbarum  Negative    38. Aspergillus Mix Negative    39. Penicillium Mix Negative    40. Bipolaris Sorokiniana (Helminthosporium) Negative    41. Drechslera Spicifera (Curvularia) Negative    42. Mucor Plumbeus Negative    43. Fusarium Moniliforme Negative    44. Aureobasidium Pullulans (pullulara) Negative    45. Rhizopus Oryzae Negative    46. Botrytis Cinera Negative    47. Epicoccum Nigrum Negative    48. Phoma Betae Negative    49. Dust Mite Mix 2+    50. Cat Hair 10,000 BAU/ml Negative    51.  Dog Epithelia Negative    52. Mixed Feathers Negative    53. Horse Epithelia Negative    54. Cockroach, German Negative    55. Tobacco Leaf Negative             Intradermal - 09/14/23 0900     Time Antigen Placed 0981  Allergen Manufacturer Greer    Location Back    Number of Test 14    Control Negative    Bahia Negative    French Southern Territories Negative    Johnson Negative    7 Grass Negative    Ragweed Mix 4+    Tree Mix 1+    Mold 1 Negative    Mold 2 Negative    Mold 3 Negative    Mold 4 3+    Cat Negative    Dog Negative    Cockroach Negative             Food Adult Perc - 09/14/23 0900     Time Antigen Placed 0930    Allergen Manufacturer Waynette Buttery    Location Back    Number of allergen test 27     Control-buffer 50% Glycerol Negative    Control-Histamine 2+    1. Peanut Negative    2. Soybean Negative    3. Wheat Negative    4. Sesame Negative    5. Milk, Cow Negative    6. Casein Negative    7. Egg White, Chicken Negative    8. Shellfish Mix Negative    9. Fish Mix Negative    10. Cashew Negative    11. Walnut Food Negative    12. Almond Negative    13. Hazelnut Negative    14. Pecan Food Negative    15. Pistachio Negative    16. [place] Nut Negative    17. Coconut Negative    18. Trout Negative    19. Tuna Negative    20. Salmon Negative    21. Flounder Negative    22. Codfish Negative    23. Shrimp Negative    24. Crab Negative    25. Lobster Negative     26. Oyster Negative    27. Scallops Negative             Reducing Pollen Exposure  The American Academy of Allergy, Asthma and Immunology suggests the following steps to reduce your exposure to pollen during allergy seasons.    Do not hang sheets or clothing out to dry; pollen may collect on these items. Do not mow lawns or spend time around freshly cut grass; mowing stirs up pollen. Keep windows closed at night.  Keep car windows closed while driving. Minimize morning activities outdoors, a time when pollen counts are usually at their highest. Stay indoors as much as possible when pollen counts or humidity is high and on windy days when pollen tends to remain in the air longer. Use air conditioning when possible.  Many air conditioners have filters that trap the pollen spores. Use a HEPA room air filter to remove pollen form the indoor air you breathe.  Control of Mold Allergen   Mold and fungi can grow on a variety of surfaces provided certain temperature and moisture conditions exist.  Outdoor molds grow on plants, decaying vegetation and soil.  The major outdoor mold, Alternaria and Cladosporium, are found in very high numbers during hot and dry conditions.  Generally, a late Summer - Fall peak is seen for common outdoor fungal spores.  Rain will temporarily lower outdoor mold spore count, but counts rise rapidly when the rainy period ends.  The most important indoor molds are Aspergillus and Penicillium.  Dark, humid and poorly ventilated basements are ideal sites for mold growth.  The next most common sites of mold growth are the bathroom and the kitchen.  Indoor (Perennial) Mold Control   Positive indoor molds via skin testing: Fusarium, Aureobasidium (Pullulara), and Rhizopus  Maintain humidity below 50%. Clean washable surfaces with 5% bleach solution. Remove sources e.g. contaminated carpets.    Control of Dust Mite Allergen    Dust mites play a major role in  allergic asthma and rhinitis.  They occur in environments with high humidity wherever human skin is found.  Dust mites absorb humidity from the atmosphere (ie, they do not drink) and feed on organic matter (including shed human and animal skin).  Dust mites are a microscopic type of insect that you cannot see with the naked eye.  High levels of dust mites have been detected from mattresses, pillows, carpets, upholstered furniture, bed covers, clothes, soft toys and any woven material.  The principal allergen of the dust mite is found in its feces.  A gram of dust may contain 1,000 mites and 250,000 fecal particles.  Mite antigen is easily measured in the air during house cleaning activities.  Dust mites do not bite and do not cause harm to humans, other than by triggering allergies/asthma.    Ways to decrease your exposure to dust mites in your home:  Encase mattresses, box springs and pillows with a mite-impermeable barrier or cover   Wash sheets, blankets and drapes weekly in hot water (130 F) with detergent and dry them in a dryer on the hot setting.  Have the room cleaned frequently with a vacuum cleaner and a damp dust-mop.  For carpeting or rugs, vacuuming with a vacuum cleaner equipped with a high-efficiency particulate air (HEPA) filter.  The dust mite allergic individual should not be in a room which is being cleaned and should wait 1 hour after cleaning before going into the room. Do not sleep on upholstered furniture (eg, couches).   If possible removing carpeting, upholstered furniture and drapery from the home is ideal.  Horizontal blinds should be eliminated in the rooms where the person spends the most time (bedroom, study, television room).  Washable vinyl, roller-type shades are optimal. Remove all non-washable stuffed toys from the bedroom.  Wash stuffed toys weekly like sheets and blankets above.   Reduce indoor humidity to less than 50%.  Inexpensive humidity monitors can be purchased  at most hardware stores.  Do not use a humidifier as can make the problem worse and are not recommended.   Allergy Shots  Allergies are the result of a chain reaction that starts in the immune system. Your immune system controls how your body defends itself. For instance, if you have an allergy to pollen, your immune system identifies pollen as an invader or allergen. Your immune system overreacts by producing antibodies called Immunoglobulin E (IgE). These antibodies travel to cells that release chemicals, causing an allergic reaction.  The concept behind allergy immunotherapy, whether it is received in the form of shots or tablets, is that the immune system can be desensitized to specific allergens that trigger allergy symptoms. Although it requires time and patience, the payback can be long-term relief. Allergy injections contain a dilute solution of those substances that you are allergic to based upon your skin testing and allergy history.   How Do Allergy Shots Work?  Allergy shots work much like a vaccine. Your body responds to injected amounts of a particular allergen given in increasing doses, eventually developing a resistance and tolerance to it. Allergy shots can lead to decreased, minimal or no allergy symptoms.  There generally are two phases: build-up  and maintenance. Build-up often ranges from three to six months and involves receiving injections with increasing amounts of the allergens. The shots are typically given once or twice a week, though more rapid build-up schedules are sometimes used.  The maintenance phase begins when the most effective dose is reached. This dose is different for each person, depending on how allergic you are and your response to the build-up injections. Once the maintenance dose is reached, there are longer periods between injections, typically two to four weeks.  Occasionally doctors give cortisone-type shots that can temporarily reduce allergy symptoms.  These types of shots are different and should not be confused with allergy immunotherapy shots.  Who Can Be Treated with Allergy Shots?  Allergy shots may be a good treatment approach for people with allergic rhinitis (hay fever), allergic asthma, conjunctivitis (eye allergy) or stinging insect allergy.   Before deciding to begin allergy shots, you should consider:   The length of allergy season and the severity of your symptoms  Whether medications and/or changes to your environment can control your symptoms  Your desire to avoid long-term medication use  Time: allergy immunotherapy requires a major time commitment  Cost: may vary depending on your insurance coverage  Allergy shots for children age 59 and older are effective and often well tolerated. They might prevent the onset of new allergen sensitivities or the progression to asthma.  Allergy shots are not started on patients who are pregnant but can be continued on patients who become pregnant while receiving them. In some patients with other medical conditions or who take certain common medications, allergy shots may be of risk. It is important to mention other medications you talk to your allergist.   What are the two types of build-ups offered:   RUSH or Rapid Desensitization -- one day of injections lasting from 8:30-4:30pm, injections every 1 hour.  Approximately half of the build-up process is completed in that one day.  The following week, normal build-up is resumed, and this entails ~16 visits either weekly or twice weekly, until reaching your "maintenance dose" which is continued weekly until eventually getting spaced out to every month for a duration of 3 to 5 years. The regular build-up appointments are nurse visits where the injections are administered, followed by required monitoring for 30 minutes.    Traditional build-up -- weekly visits for 6 -12 months until reaching "maintenance dose", then continue weekly until  eventually spacing out to every 4 weeks as above. At these appointments, the injections are administered, followed by required monitoring for 30 minutes.     Either way is acceptable, and both are equally effective. With the rush protocol, the advantage is that less time is spent here for injections overall AND you would also reach maintenance dosing faster (which is when the clinical benefit starts to become more apparent). Not everyone is a candidate for rapid desensitization.   IF we proceed with the RUSH protocol, there are premedications which must be taken the day before and the day after the rush only (this includes antihistamines, steroids, and Singulair).  After the rush day, no prednisone or Singulair is required, and we just recommend antihistamines taken on your injection day.  What Is An Estimate of the Costs?  If you are interested in starting allergy injections, please check with your insurance company about your coverage for both allergy vial sets and allergy injections.  Please do so prior to making the appointment to start injections.  The following are CPT  codes to give to your insurance company. These are the amounts we BILL to the insurance company, but the amount YOU WILL PAY and WE RECEIVE IS SUBSTANTIALLY LESS and depends on the contracts we have with different insurance companies.   Amount Billed to Insurance One allergy vial set  CPT 95165   $ 1200     Two allergy vial set  CPT 95165   $ 2400     Three allergy vial set  CPT 95165   $ 3600     One injection   CPT 95115   $ 35  Two injections   CPT 95117   $ 40 RUSH (Rapid Desensitization) CPT 95180 x 8 hours $500/hour  Regarding the allergy injections, your co-pay may or may not apply with each injection, so please confirm this with your insurance company. When you start allergy injections, 1 or 2 sets of vials are made based on your allergies.  Not all patients can be on one set of vials. A set of vials lasts 6 months to  a year depending on how quickly you can proceed with your build-up of your allergy injections. Vials are personalized for each patient depending on their specific allergens.  How often are allergy injection given during the build-up period?   Injections are given at least weekly during the build-up period until your maintenance dose is achieved. Per the doctor's discretion, you may have the option of getting allergy injections two times per week during the build-up period. However, there must be at least 48 hours between injections. The build-up period is usually completed within 6-12 months depending on your ability to schedule injections and for adjustments for reactions. When maintenance dose is reached, your injection schedule is gradually changed to every two weeks and later to every three weeks. Injections will then continue every 4 weeks. Usually, injections are continued for a total of 3-5 years.   When Will I Feel Better?  Some may experience decreased allergy symptoms during the build-up phase. For others, it may take as long as 12 months on the maintenance dose. If there is no improvement after a year of maintenance, your allergist will discuss other treatment options with you.  If you aren't responding to allergy shots, it may be because there is not enough dose of the allergen in your vaccine or there are missing allergens that were not identified during your allergy testing. Other reasons could be that there are high levels of the allergen in your environment or major exposure to non-allergic triggers like tobacco smoke.  What Is the Length of Treatment?  Once the maintenance dose is reached, allergy shots are generally continued for three to five years. The decision to stop should be discussed with your allergist at that time. Some people may experience a permanent reduction of allergy symptoms. Others may relapse and a longer course of allergy shots can be considered.  What Are the  Possible Reactions?  The two types of adverse reactions that can occur with allergy shots are local and systemic. Common local reactions include very mild redness and swelling at the injection site, which can happen immediately or several hours after. Report a delayed reaction from your last injection. These include arm swelling or runny nose, watery eyes or cough that occurs within 12-24 hours after injection. A systemic reaction, which is less common, affects the entire body or a particular body system. They are usually mild and typically respond quickly to medications. Signs include increased allergy symptoms  such as sneezing, a stuffy nose or hives.   Rarely, a serious systemic reaction called anaphylaxis can develop. Symptoms include swelling in the throat, wheezing, a feeling of tightness in the chest, nausea or dizziness. Most serious systemic reactions develop within 30 minutes of allergy shots. This is why it is strongly recommended you wait in your doctor's office for 30 minutes after your injections. Your allergist is trained to watch for reactions, and his or her staff is trained and equipped with the proper medications to identify and treat them.   Report to the nurse immediately if you experience any of the following symptoms: swelling, itching or redness of the skin, hives, watery eyes/nose, breathing difficulty, excessive sneezing, coughing, stomach pain, diarrhea, or light headedness. These symptoms may occur within 15-20 minutes after injection and may require medication.   Who Should Administer Allergy Shots?  The preferred location for receiving shots is your prescribing allergist's office. Injections can sometimes be given at another facility where the physician and staff are trained to recognize and treat reactions, and have received instructions by your prescribing allergist.  What if I am late for an injection?   Injection dose will be adjusted depending upon how many days or  weeks you are late for your injection.   What if I am sick?   Please report any illness to the nurse before receiving injections. She may adjust your dose or postpone injections depending on your symptoms. If you have fever, flu, sinus infection or chest congestion it is best to postpone allergy injections until you are better. Never get an allergy injection if your asthma is causing you problems. If your symptoms persist, seek out medical care to get your health problem under control.  What If I am or Become Pregnant:  Women that become pregnant should schedule an appointment with [facility] before receiving any further allergy injections.

## 2023-09-17 LAB — ALPHA-GAL PANEL
Allergen Lamb IgE: <0.1 kU/L
Beef IgE: <0.1 kU/L
IgE (Immunoglobulin E), Serum: 65 [IU]/mL (ref 6–495)
O215-IgE Alpha-Gal: <0.1 kU/L
Pork IgE: <0.1 kU/L

## 2023-09-21 LAB — SEDIMENTATION RATE: Sed Rate: 3 mm/h (ref 0–32)

## 2023-09-21 LAB — THYROID ANTIBODIES
Thyroglobulin Antibody: <1 [IU]/mL (ref 0.0–0.9)
Thyroperoxidase Ab SerPl-aCnc: 10 [IU]/mL (ref 0–34)

## 2023-09-21 LAB — CBC WITH DIFFERENTIAL/PLATELET
Basophils Absolute: 0 10*3/uL (ref 0.0–0.2)
Basos: 1 %
EOS (ABSOLUTE): 0.2 10*3/uL (ref 0.0–0.4)
Eos: 3 %
Hematocrit: 41.3 % (ref 34.0–46.6)
Hemoglobin: 13.2 g/dL (ref 11.1–15.9)
Immature Grans (Abs): 0 10*3/uL (ref 0.0–0.1)
Immature Granulocytes: 0 %
Lymphocytes Absolute: 1.7 10*3/uL (ref 0.7–3.1)
Lymphs: 27 %
MCH: 27.4 pg (ref 26.6–33.0)
MCHC: 32 g/dL (ref 31.5–35.7)
MCV: 86 fL (ref 79–97)
Monocytes Absolute: 0.4 10*3/uL (ref 0.1–0.9)
Monocytes: 6 %
Neutrophils Absolute: 4 10*3/uL (ref 1.4–7.0)
Neutrophils: 63 %
Platelets: 254 10*3/uL (ref 150–450)
RBC: 4.82 x10E6/uL (ref 3.77–5.28)
RDW: 12.8 % (ref 11.7–15.4)
WBC: 6.3 10*3/uL (ref 3.4–10.8)

## 2023-09-21 LAB — TRYPTASE: Tryptase: 3.3 ug/L (ref 2.2–13.2)

## 2023-09-21 LAB — CMP14+EGFR
ALT: 11 [IU]/L (ref 0–32)
AST: 15 [IU]/L (ref 0–40)
Albumin: 4.4 g/dL (ref 3.9–4.9)
Alkaline Phosphatase: 89 [IU]/L (ref 44–121)
BUN/Creatinine Ratio: 12 (ref 9–23)
BUN: 11 mg/dL (ref 6–20)
Bilirubin Total: 0.4 mg/dL (ref 0.0–1.2)
CO2: 21 mmol/L (ref 20–29)
Calcium: 9.5 mg/dL (ref 8.7–10.2)
Chloride: 101 mmol/L (ref 96–106)
Creatinine, Ser: 0.9 mg/dL (ref 0.57–1.00)
Globulin, Total: 3.4 g/dL (ref 1.5–4.5)
Glucose: 79 mg/dL (ref 70–99)
Potassium: 4.1 mmol/L (ref 3.5–5.2)
Sodium: 139 mmol/L (ref 134–144)
Total Protein: 7.8 g/dL (ref 6.0–8.5)
eGFR: 85 mL/min/{1.73_m2} (ref >59)

## 2023-09-21 LAB — ANTINUCLEAR ANTIBODIES, IFA: ANA Titer 1: NEGATIVE

## 2023-09-21 LAB — CHRONIC URTICARIA

## 2023-09-21 LAB — C-REACTIVE PROTEIN: CRP: 7 mg/L (ref 0–10)

## 2023-09-24 NOTE — Addendum Note (Signed)
Addended by: Alfonse Spruce on: 09/24/2023 04:39 PM   Modules accepted: Orders

## 2023-11-16 ENCOUNTER — Ambulatory Visit: Payer: MEDICAID | Admitting: Allergy & Immunology

## 2023-12-17 ENCOUNTER — Other Ambulatory Visit: Payer: Self-pay

## 2023-12-17 ENCOUNTER — Encounter: Payer: Self-pay | Admitting: Obstetrics & Gynecology

## 2023-12-17 ENCOUNTER — Ambulatory Visit: Payer: MEDICAID | Admitting: Obstetrics & Gynecology

## 2023-12-17 ENCOUNTER — Other Ambulatory Visit (HOSPITAL_COMMUNITY)
Admission: RE | Admit: 2023-12-17 | Discharge: 2023-12-17 | Disposition: A | Discharge status: Home / Self Care | Payer: MEDICAID | Source: Ambulatory Visit | Attending: Obstetrics & Gynecology | Admitting: Obstetrics & Gynecology

## 2023-12-17 VITALS — BP 119/83 | HR 94 | Wt 193.5 lb

## 2023-12-17 DIAGNOSIS — D352 Benign neoplasm of pituitary gland: Secondary | ICD-10-CM

## 2023-12-17 DIAGNOSIS — D369 Benign neoplasm, unspecified site: Secondary | ICD-10-CM | POA: Diagnosis not present

## 2023-12-17 DIAGNOSIS — N921 Excessive and frequent menstruation with irregular cycle: Secondary | ICD-10-CM | POA: Diagnosis present

## 2023-12-17 DIAGNOSIS — N760 Acute vaginitis: Secondary | ICD-10-CM

## 2023-12-17 DIAGNOSIS — B9689 Other specified bacterial agents as the cause of diseases classified elsewhere: Secondary | ICD-10-CM

## 2023-12-17 DIAGNOSIS — Z1331 Encounter for screening for depression: Secondary | ICD-10-CM

## 2023-12-17 DIAGNOSIS — Z975 Presence of (intrauterine) contraceptive device: Secondary | ICD-10-CM | POA: Diagnosis present

## 2023-12-17 DIAGNOSIS — E229 Hyperfunction of pituitary gland, unspecified: Secondary | ICD-10-CM

## 2023-12-17 NOTE — Progress Notes (Signed)
 GYNECOLOGY OFFICE VISIT NOTE  History:   Kathryn Suarez is a 38 y.o. G1P0010 here today for evaluation of breakthrough bleeding with Liletta  in place. Liletta  placed in 2018.  Had some spotting/irregular spotting off and on for a few weeks.  Had this in the past, was treated with OCPs.  No bleeding in the last four days. History of laparoscopy proven endometriosis. Also has a history of pituitary microadenoma with hyperprolactinemia; no recent labs or endocrinology follow up.  She denies any abnormal vaginal discharge, pelvic pain or other concerns.    Past Medical History:  Diagnosis Date   Asthma    Chronic pelvic pain in female    Endometriosis of the cul-de-sac    stage 1, biopsy proven with laparoscopy 05/2018   History of anemia    Hypertension    not currently being treated at this time- weight loss    Kidney stones    Ovarian torsion 2020   h/o LSO   Pituitary microadenoma with hyperprolactinemia (HCC)    Sickle cell trait (HCC)     Past Surgical History:  Procedure Laterality Date   DILATION AND CURETTAGE OF UTERUS  03/23/2007   EXCISION OF TONGUE LESION     LAPAROSCOPIC OOPHERECTOMY Left 08/2019   in [place]    LAPAROSCOPY N/A 06/01/2018   Procedure: LAPAROSCOPY DIAGNOSTIC, PERITONEAL BIOPSY;  Surgeon: Starla Harland BROCKS, MD;  Location: [facility];  Service: Gynecology;  Laterality: N/A;    The following portions of the patient's history were reviewed and updated as appropriate: allergies, current medications, past family history, past medical history, past social history, past surgical history and problem list.   Health Maintenance:  Normal pap and negative HRHPV on 06/11/2021.    Review of Systems:  Pertinent items noted in HPI and remainder of comprehensive ROS otherwise negative.  Physical Exam:  BP 119/83   Pulse 94   Wt 193 lb 8 oz (87.8 kg)   LMP 12/15/2023 Comment: spotting period ended 2 days before  BMI 33.48 kg/m  CONSTITUTIONAL:  Well-developed, well-nourished female in no acute distress.  HEENT:  Normocephalic, atraumatic. External right and left ear normal. No scleral icterus.  NECK: Normal range of motion, supple, no masses noted on observation SKIN: No rash noted. Not diaphoretic. No erythema. No pallor. MUSCULOSKELETAL: Normal range of motion. No edema noted. NEUROLOGIC: Alert and oriented to person, place, and time. Normal muscle tone coordination. No cranial nerve deficit noted. PSYCHIATRIC: Normal mood and affect. Normal behavior. Normal judgment and thought content. CARDIOVASCULAR: Normal heart rate noted RESPIRATORY: Effort and breath sounds normal, no problems with respiration noted ABDOMEN: No masses noted. No other overt distention noted.   PELVIC: Deferred      Assessment and Plan:    1.  Pituitary microadenoma with hyperprolactinemia (HCC) Will check labs. If abnormal, will refer to Endocrinologist. Patient is aware that these labs can cause bleeding irregularities. - Prolactin; Future - TSH Rfx on Abnormal to Free T4; Future  3. Breakthrough bleeding with IUD (Primary) Self-swab done, will also pelvic ultrasound. Offered OCPs again, she declined for now. Will follow up studies and manage accordingly. - Cervicovaginal ancillary only - US  PELVIC COMPLETE WITH TRANSVAGINAL; Future  Routine preventative health maintenance measures emphasized. Please refer to After Visit Summary for other counseling recommendations.   Return for Lab Appointment (fasting) sometime next week.    I spent 30 minutes dedicated to the care of this patient including pre-visit review of records, face to face time with  the patient discussing her conditions and treatments and post visit orders.    GLORIS HUGGER, MD, FACOG Obstetrician & Gynecologist, [facility] for [employer], [facility]

## 2023-12-21 LAB — CERVICOVAGINAL ANCILLARY ONLY
Bacterial Vaginitis (gardnerella): POSITIVE — AB
Candida Glabrata: NEGATIVE
Candida Vaginitis: NEGATIVE
Chlamydia: NEGATIVE
Comment: NEGATIVE
Comment: NEGATIVE
Comment: NEGATIVE
Comment: NEGATIVE
Comment: NEGATIVE
Comment: NORMAL
Neisseria Gonorrhea: NEGATIVE
Trichomonas: NEGATIVE

## 2023-12-21 MED ORDER — METRONIDAZOLE 500 MG PO TABS: 500.0000 mg | ORAL_TABLET | Freq: Two times a day (BID) | ORAL | 0 refills | Status: AC

## 2023-12-21 NOTE — Addendum Note (Signed)
 Addended by: Jaynie Collins A on: 12/21/2023 12:12 PM   Modules accepted: Orders

## 2023-12-23 ENCOUNTER — Ambulatory Visit (HOSPITAL_COMMUNITY)
Admission: RE | Admit: 2023-12-23 | Discharge: 2023-12-23 | Disposition: A | Discharge status: Home / Self Care | Payer: MEDICAID | Source: Ambulatory Visit | Attending: Obstetrics & Gynecology | Admitting: Obstetrics & Gynecology

## 2023-12-23 DIAGNOSIS — N921 Excessive and frequent menstruation with irregular cycle: Secondary | ICD-10-CM | POA: Insufficient documentation

## 2023-12-23 DIAGNOSIS — Z975 Presence of (intrauterine) contraceptive device: Secondary | ICD-10-CM | POA: Insufficient documentation

## 2023-12-25 ENCOUNTER — Encounter: Payer: Self-pay | Admitting: Obstetrics & Gynecology

## 2023-12-28 ENCOUNTER — Other Ambulatory Visit: Payer: Self-pay | Admitting: Lactation Services

## 2023-12-28 MED ORDER — CLINDAMYCIN PHOSPHATE 2 % VA CREA: 1.0000 | TOPICAL_CREAM | Freq: Every day | VAGINAL | 0 refills | Status: DC

## 2023-12-28 NOTE — Progress Notes (Signed)
 Cleocin gel sent to pharmacy per standing order for treatment for BV and patient intolerant to Flagyl orally and was not able to complete treatment.

## 2023-12-31 ENCOUNTER — Telehealth: Payer: Self-pay | Admitting: Lactation Services

## 2023-12-31 NOTE — Telephone Encounter (Signed)
Kathryn Suarez (Key: [id number]) Need Help? Call us at [phone] Status sent iconSent to Plan today Drug Clindamycin Phosphate 2% cream ePA cloud logo Form [employer] Electronic Prior Authorization Form  Clindamycin Gel PA sent through Covermymeds, awaiting determination.

## 2024-01-03 ENCOUNTER — Encounter: Payer: Self-pay | Admitting: Lactation Services

## 2024-01-03 MED ORDER — METRONIDAZOLE 0.75 % VA GEL: 1.0000 | Freq: Every day | VAGINAL | 0 refills | Status: DC

## 2024-01-03 NOTE — Addendum Note (Signed)
Addended by: Ed Blalock on: 01/03/2024 10:50 AM   Modules accepted: Orders

## 2024-01-03 NOTE — Telephone Encounter (Signed)
Kathryn Suarez (KeyOtila Kluver) PA Case ID #: [id number] Need Help? Call us at [phone] Outcome Denied on January 17 by [employer] 2017 Denied Drug Clindamycin Phosphate 2% cream ePA cloud logo Form [employer] Electronic Prior Authorization Form  Ordered Metronidazole gel per approved medications list for Medicaid.

## 2024-01-31 ENCOUNTER — Encounter: Payer: Self-pay | Admitting: Obstetrics & Gynecology

## 2024-01-31 ENCOUNTER — Ambulatory Visit (INDEPENDENT_AMBULATORY_CARE_PROVIDER_SITE_OTHER): Payer: MEDICAID | Admitting: Obstetrics & Gynecology

## 2024-01-31 ENCOUNTER — Other Ambulatory Visit: Payer: Self-pay

## 2024-01-31 VITALS — BP 120/85 | HR 92 | Wt 192.0 lb

## 2024-01-31 DIAGNOSIS — N938 Other specified abnormal uterine and vaginal bleeding: Secondary | ICD-10-CM | POA: Diagnosis not present

## 2024-01-31 DIAGNOSIS — D352 Benign neoplasm of pituitary gland: Secondary | ICD-10-CM | POA: Diagnosis not present

## 2024-01-31 DIAGNOSIS — N809 Endometriosis, unspecified: Secondary | ICD-10-CM | POA: Diagnosis not present

## 2024-01-31 DIAGNOSIS — E229 Hyperfunction of pituitary gland, unspecified: Secondary | ICD-10-CM

## 2024-01-31 MED ORDER — MEDROXYPROGESTERONE ACETATE 10 MG PO TABS: 10.0000 mg | ORAL_TABLET | Freq: Every day | ORAL | 2 refills | Status: DC

## 2024-01-31 NOTE — Progress Notes (Signed)
Patient ID: Kathryn Suarez, female   DOB: 10-22-1986, 38 y.o.   MRN: [id number]  Chief Complaint  Patient presents with   Abdominal Pain    HPI Kathryn Suarez is a 38 y.o. female.  G1P0010 No LMP recorded. (Menstrual status: IUD). Patient has frequent irregular bleeding with IUD in place for 2.5 years. She has taken OCP in the past for DUB. She notes dysmenorrhea as well HPI  Past Medical History:  Diagnosis Date   Asthma    Chronic pelvic pain in female    Endometriosis of the cul-de-sac    stage 1, biopsy proven with laparoscopy 05/2018   History of anemia    Hypertension    not currently being treated at this time- weight loss    Kidney stones    Ovarian torsion 2020   h/o LSO   Pituitary microadenoma with hyperprolactinemia (HCC)    Sickle cell trait (HCC)     Past Surgical History:  Procedure Laterality Date   DILATION AND CURETTAGE OF UTERUS  03/23/2007   EXCISION OF TONGUE LESION     LAPAROSCOPIC OOPHERECTOMY Left 08/2019   in [place]   LAPAROSCOPY N/A 06/01/2018   Procedure: LAPAROSCOPY DIAGNOSTIC, PERITONEAL BIOPSY;  Surgeon: Allie Bossier, MD;  Location:  [facility];  Service: Gynecology;  Laterality: N/A;    Family History  Problem Relation Age of Onset   Urticaria Mother    Eczema Mother    Asthma Mother    Hypertension Mother    Sickle cell trait Father    Asthma Brother    Eczema Brother     Social History Social History   Tobacco Use   Smoking status: Never    Passive exposure: Never   Smokeless tobacco: Never  Vaping Use   Vaping status: Never Used  Substance Use Topics   Alcohol use: No    Comment: occas   Drug use: No    Allergies  Allergen Reactions   Latex Hives, Rash and Other (See Comments)   Mushroom Anaphylaxis   Mushroom Extract Complex (Obsolete) Anaphylaxis   Peanut Butter Flavoring Agent (Non-Screening) Hives    Current Outpatient Medications  Medication Sig Dispense Refill   cabergoline (DOSTINEX) 0.5  MG tablet Take 0.5 tablets (0.25 mg total) by mouth 2 (two) times a week. 10 tablet 1   cetirizine (ZYRTEC) 10 MG tablet SMARTSIG:1 Tablet(s) By Mouth Every Evening PRN     cyclobenzaprine (FLEXERIL) 10 MG tablet Take 1 tablet (10 mg total) by mouth 2 (two) times daily as needed for muscle spasms. 20 tablet 0   ELDERBERRY PO Take by mouth.     famotidine (PEPCID) 20 MG tablet Take 1 tablet (20 mg total) by mouth 2 (two) times daily. 180 tablet 1   hydrocortisone 2.5 % cream Apply to affected skin of face/neck/groin 1-2 times daily as needed for itching/rash.     hydrOXYzine (ATARAX) 25 MG tablet Take 25 mg by mouth 2 (two) times daily as needed.     ibuprofen (ADVIL) 800 MG tablet Take 1 tablet (800 mg total) by mouth every 8 (eight) hours as needed. 30 tablet 0   levocetirizine (XYZAL) 5 MG tablet Take 1 tablet (5 mg total) by mouth in the morning and at bedtime. 180 tablet 1   levonorgestrel (MIRENA) 20 MCG/DAY IUD by Intrauterine route.     LORazepam (ATIVAN) 1 MG tablet Take by mouth.     medroxyPROGESTERone (PROVERA) 10 MG tablet Take 1 tablet (10 mg total)  by mouth daily. 30 tablet 2   Melatonin 1 MG CAPS Take by mouth.     omeprazole (PRILOSEC) 20 MG capsule Take 1 capsule (20 mg total) by mouth daily. 30 capsule 0   oxyCODONE-acetaminophen (PERCOCET) 10-325 MG tablet Take 1 tablet by mouth every 6 (six) hours.     phentermine (ADIPEX-P) 37.5 MG tablet TAKE 1 TABLET(37.5 MG) BY MOUTH 30 MINUTES BEFORE BREAKFAST     polyethylene glycol (MIRALAX) 17 g packet Take 17 g by mouth daily. 14 each 0   PROZAC 20 MG capsule Take 20 mg by mouth every morning.     topiramate (TOPAMAX) 50 MG tablet      triamcinolone cream (KENALOG) 0.5 % Apply topically 2 (two) times daily as needed.     VENTOLIN HFA 108 (90 Base) MCG/ACT inhaler SMARTSIG:2 Puff(s) By Mouth 4 Times Daily PRN     clindamycin (CLEOCIN) 2 % vaginal cream Place 1 Applicatorful vaginally at bedtime. 40 g 0   medroxyPROGESTERone (PROVERA)  10 MG tablet Take 1 tablet (10 mg total) by mouth daily. (Patient not taking: Reported on 01/31/2024) 90 tablet 3   montelukast (SINGULAIR) 10 MG tablet Take 1 tablet (10 mg total) by mouth at bedtime. 90 tablet 1   mupirocin ointment (BACTROBAN) 2 % Apply topically 2 (two) times daily. (Patient not taking: Reported on 01/31/2024)     norethindrone-ethinyl estradiol 1/35 (ORTHO-NOVUM) tablet Take 1 tablet by mouth daily. 2 tablets daily until bleeding stops then one a day (Patient not taking: Reported on 01/31/2024) 28 tablet 11   norgestimate-ethinyl estradiol (ORTHO-CYCLEN) 0.25-35 MG-MCG tablet Take 1 tablet by mouth daily. (Patient not taking: Reported on 01/31/2024) 28 tablet 11   ondansetron (ZOFRAN-ODT) 4 MG disintegrating tablet 4mg  ODT q4 hours prn nausea/vomit (Patient not taking: Reported on 01/31/2024) 8 tablet 0   oxyCODONE-acetaminophen (PERCOCET/ROXICET) 5-325 MG tablet Take 3 tablets by mouth every 6 (six) hours as needed for severe pain (gets this at the pain clinic). (Patient not taking: Reported on 01/31/2024)     No current facility-administered medications for this visit.    Review of Systems Review of Systems  Constitutional: Negative.   Respiratory: Negative.    Cardiovascular: Negative.   Gastrointestinal: Negative.   Genitourinary:  Positive for menstrual problem, pelvic pain and vaginal bleeding. Negative for vaginal discharge.    Blood pressure 120/85, pulse 92, weight 192 lb (87.1 kg).  Physical Exam Physical Exam Vitals and nursing note reviewed.  Constitutional:      Appearance: She is well-developed. She is not ill-appearing.  HENT:     Head: Normocephalic and atraumatic.  Abdominal:     General: Abdomen is flat.  Neurological:     Mental Status: She is alert.  Psychiatric:        Mood and Affect: Mood normal.        Behavior: Behavior normal.     Data Reviewed Pelvic US  Assessment Endometriosis - Plan: medroxyPROGESTERone (PROVERA) 10 MG tablet,  CBC  Pituitary microadenoma with hyperprolactinemia (HCC) - Plan: TSH Rfx on Abnormal to Free T4, Prolactin  DUB Plan Orders Placed This Encounter  Procedures   CBC   RTC 3 mo Meds ordered this encounter  Medications   medroxyPROGESTERone (PROVERA) 10 MG tablet    Sig: Take 1 tablet (10 mg total) by mouth daily.    Dispense:  30 tablet    Refill:  2       Scheryl Darter 01/31/2024, 5:03 PM

## 2024-02-01 ENCOUNTER — Encounter: Payer: Self-pay | Admitting: Obstetrics & Gynecology

## 2024-02-01 LAB — CBC
Hematocrit: 38.1 % (ref 34.0–46.6)
Hemoglobin: 12.3 g/dL (ref 11.1–15.9)
MCH: 27.6 pg (ref 26.6–33.0)
MCHC: 32.3 g/dL (ref 31.5–35.7)
MCV: 86 fL (ref 79–97)
Platelets: 287 10*3/uL (ref 150–450)
RBC: 4.45 x10E6/uL (ref 3.77–5.28)
RDW: 13 % (ref 11.7–15.4)
WBC: 7.5 10*3/uL (ref 3.4–10.8)

## 2024-02-01 LAB — PROLACTIN: Prolactin: 21.2 ng/mL (ref 4.8–33.4)

## 2024-02-01 LAB — TSH RFX ON ABNORMAL TO FREE T4: TSH: 1.84 u[IU]/mL (ref 0.450–4.500)

## 2024-04-30 ENCOUNTER — Encounter: Payer: Self-pay | Admitting: Obstetrics & Gynecology

## 2024-05-01 ENCOUNTER — Other Ambulatory Visit: Payer: Self-pay

## 2024-05-01 DIAGNOSIS — N809 Endometriosis, unspecified: Secondary | ICD-10-CM

## 2024-05-01 MED ORDER — MEDROXYPROGESTERONE ACETATE 10 MG PO TABS: 10.0000 mg | ORAL_TABLET | Freq: Every day | ORAL | 2 refills | Status: DC

## 2024-05-01 NOTE — Telephone Encounter (Cosign Needed)
 Refill for Provera  filled to pt pharmacy per Dr. Elester Grim. See MyChart messages.   Arnell Lange, RN

## 2024-06-08 ENCOUNTER — Encounter: Payer: Self-pay | Admitting: Obstetrics & Gynecology

## 2024-06-09 NOTE — Telephone Encounter (Signed)
 Can we schedule her with Dr. Jeralyn for discuss about surgery for endometriosis?

## 2024-06-09 NOTE — Telephone Encounter (Signed)
 Can we schedule her an appointment with Dr. Eveline for discussion for Endometriosis surgery?  Thank you! Ermalinda GRADE CMA

## 2024-07-21 ENCOUNTER — Ambulatory Visit: Payer: MEDICAID | Admitting: Obstetrics & Gynecology

## 2024-08-15 ENCOUNTER — Ambulatory Visit (INDEPENDENT_AMBULATORY_CARE_PROVIDER_SITE_OTHER): Payer: MEDICAID

## 2024-08-15 ENCOUNTER — Encounter: Payer: Self-pay | Admitting: Podiatry

## 2024-08-15 ENCOUNTER — Ambulatory Visit: Payer: MEDICAID | Admitting: Podiatry

## 2024-08-15 DIAGNOSIS — S9031XA Contusion of right foot, initial encounter: Secondary | ICD-10-CM | POA: Diagnosis not present

## 2024-08-15 DIAGNOSIS — M7751 Other enthesopathy of right foot: Secondary | ICD-10-CM | POA: Diagnosis not present

## 2024-08-15 DIAGNOSIS — R262 Difficulty in walking, not elsewhere classified: Secondary | ICD-10-CM | POA: Diagnosis not present

## 2024-08-15 DIAGNOSIS — M778 Other enthesopathies, not elsewhere classified: Secondary | ICD-10-CM

## 2024-08-15 MED ORDER — PREDNISONE 20 MG PO TABS: ORAL_TABLET | ORAL | 0 refills | Status: AC

## 2024-08-15 NOTE — Progress Notes (Unsigned)
 Chief Complaint  Patient presents with   Foot Pain    R foot pain dorsal meta  4 moths ago stepped wrong when playing with kids.   Non Diabetic no anticoag   HPI: 38 y.o. female presents today with concern of chronic pain to the dorsal aspect of the right midfoot and forefoot.  She states that approximately 5 months ago she was playing kickball with some children and ran into one of them, noting sudden pain to the top of her foot and has had pain in that area ever since.  She states has been getting difficult to walk and work.  Past Medical History:  Diagnosis Date   Asthma    Chronic pelvic pain in female    Endometriosis of the cul-de-sac    stage 1, biopsy proven with laparoscopy 05/2018   History of anemia    Hypertension    not currently being treated at this time- weight loss    Kidney stones    Ovarian torsion 2020   h/o LSO   Pituitary microadenoma with hyperprolactinemia (HCC)    Sickle cell trait (HCC)    Past Surgical History:  Procedure Laterality Date   DILATION AND CURETTAGE OF UTERUS  03/23/2007   EXCISION OF TONGUE LESION     LAPAROSCOPIC OOPHERECTOMY Left 08/2019   in [place]    LAPAROSCOPY N/A 06/01/2018   Procedure: LAPAROSCOPY DIAGNOSTIC, PERITONEAL BIOPSY;  Surgeon: Starla Harland BROCKS, MD;  Location: [facility];  Service: Gynecology;  Laterality: N/A;   Allergies  Allergen Reactions   Latex Hives, Rash and Other (See Comments)   Mushroom Anaphylaxis   Mushroom Extract Complex (Obsolete) Anaphylaxis   Peanut Butter Flavoring Agent (Non-Screening) Hives    Physical Exam: Palpable pedal pulses.  There is significant diffuse pain on palpation to the dorsal aspect of the right foot.  There is weakness and pain with resisted extension of the foot and toes along the long extensor tendons.  Some minimal pain with range of motion of the right MPJs.  Pain on palpation at the right tarsometatarsal joints.  No ecchymosis noted.  Minimal diffuse edema.   Antalgic gait with weightbearing secondary to pain.  Epicritic sensation is intact.  No open lesions are noted  Radiographic Exam (right foot, 3 weightbearing views, 08-15-24):  Normal osseous mineralization. No fractures noted.  Mild increase in first intermetatarsal angle.  Tibial sesamoid position is 4.  Assessment/Plan of Care: 1. Contusion of right foot, initial encounter   2. Capsulitis of metatarsophalangeal (MTP) joint of right foot   3. Difficulty walking   4. Extensor tendinitis of foot     Meds ordered this encounter  Medications   predniSONE  (DELTASONE ) 20 MG tablet    Sig: Take 2 tablets (40 mg total) by mouth daily with breakfast for 3 days, THEN 1 tablet (20 mg total) daily with breakfast for 3 days, THEN 0.5 tablets (10 mg total) daily with breakfast for 4 days.    Dispense:  11 tablet    Refill:  0   PR PNEUMAT WALKING BOOT PRE CST DG FOOT COMPLETE RIGHT  Reviewed clinical and radiographic findings with the patient today.  Informed her that she has extensor tendinitis to the long extensor tendons in the right foot.  Due to the diffuse area of pain, I did not recommend a cortisone injection.  Will start her on a tapered prednisone  pack to improve pain and inflammation and restore function to the foot.  Also, due to her difficulty  walking and weakness with resisted dorsiflexion secondary to pain, we will place her in a pneumatic cam walker to wear at all times weightbearing (remove for driving) for the next 2 to 3 weeks.  Will recheck in approximately 3 weeks.  She was given a note at checkout to be able to wear her cam walker at work.  Proof of delivery form and insurance waiver signed via motion MD upon dispensing the pneumatic cam walker.  If there is no improvement at next visit, will order MRI   Awanda CHARM Imperial, DPM, FACFAS [facility]     [address] [place], [place] [address]                Office [phone]  Fax [phone]

## 2024-08-29 ENCOUNTER — Ambulatory Visit (INDEPENDENT_AMBULATORY_CARE_PROVIDER_SITE_OTHER): Payer: MEDICAID | Admitting: Dermatology

## 2024-08-29 ENCOUNTER — Encounter: Payer: Self-pay | Admitting: Dermatology

## 2024-08-29 VITALS — BP 127/85

## 2024-08-29 DIAGNOSIS — Q825 Congenital non-neoplastic nevus: Secondary | ICD-10-CM

## 2024-08-29 DIAGNOSIS — L309 Dermatitis, unspecified: Secondary | ICD-10-CM

## 2024-08-29 DIAGNOSIS — L564 Polymorphous light eruption: Secondary | ICD-10-CM

## 2024-08-29 MED ORDER — TACROLIMUS 0.1 % EX OINT: TOPICAL_OINTMENT | Freq: Two times a day (BID) | CUTANEOUS | 2 refills | Status: AC

## 2024-08-29 MED ORDER — CLOBETASOL PROPIONATE 0.05 % EX CREA: 1.0000 | TOPICAL_CREAM | Freq: Two times a day (BID) | CUTANEOUS | 2 refills | Status: AC

## 2024-08-29 NOTE — Progress Notes (Signed)
   New Patient Visit   Subjective  Kathryn Suarez is a 38 y.o. female who presents for the following: Hives x ~ 2 years - they breakout on  left face (within her birthmark), back, arms and legs. She takes hydroxyzine as needed for itch and TMC 0.5% cream for the affected areas of body. She doesn't use anything on her face because the sun irritates it. It seems to be worse with sun exposure. It gets less intense toward the end of the summer. [facility] and she is allergic to dust. She does not notice any triggers other than the sun.She is taking Pepcid , Xyzal  and Singulair .     The following portions of the chart were reviewed this encounter and updated as appropriate: medications, allergies, medical history  Review of Systems:  No other skin or systemic complaints except as noted in HPI or Assessment and Plan.  Objective  Well appearing patient in no apparent distress; mood and affect are within normal limits.   A focused examination was performed of the following areas: Face, arms, legs  Relevant exam findings are noted in the Assessment and Plan.      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 Symptoms include burning and irritation, with 2-3 lesions typically appearing on arms, legs, back, shoulder, and face. Sun exposure exacerbates symptoms, particularly on one side of the face, with increased severity at the beginning of summer. Previous treatment with Xyzal , Pepcid , and Singulair  has been inconsistent and only used as needed during flare-ups. - Plan:    Prescribe clobetasol  cream for body lesions twice daily for up to 2 weeks during flares    Discontinue use in the same area after 2 weeks    Prescribe tacrolimus  ointment for facial lesions     Recommend mineral sunscreens containing zinc oxide or titanium dioxide with reapplication every 4 hours    For persistent body lesions use clobetasol  for 2 weeks, if not resolved switch to tacrolimus  for 2 weeks, repeat cycle until resolution    Advise daily moisturizer application after showering to prevent flares    Consider initiating Dupixent if new flares occur  2. Congenital Nevus - Assessment: Patient has pigmented lesion on the left side of her face involving blashkoid lines  with occasional changes in sensation. No new growths or visible changes noted. - Plan:    Take photograph of birthmark for monitoring purposes  Follow-up in 3 months to assess response to treatment modifications.     Return in about 3 months (around 11/28/2024) for Atopic Dermatitis.  I, Roseline Hutchinson, CMA, am acting as scribe for [employer], DO .   Documentation: I have reviewed the above documentation for accuracy and completeness, and I agree with the above.  Delon Lenis, DO   .

## 2024-08-29 NOTE — Patient Instructions (Addendum)
 Date: Tue Aug 29 2024  Hello Kathryn Suarez,  Thank you for visiting today. Here is a summary of the key instructions:  - Medications:   - Continue taking Xyzal , Pepcid , and Singulair  as needed for flare-ups   - Use clobetasol  cream twice a day for up to 2 weeks during a flare on body areas   - Use tacrolimus  ointment on face during flares  - Skin Care:   - Use moisturizer daily after showering to prevent flares   - For body flares: Use clobetasol  cream for 2 weeks. If not resolved, switch to tacrolimus  for 2 weeks. Repeat until flare is gone   - Apply mineral sunscreen with zinc oxide or titanium dioxide every 4 hours  - Follow-up:   - Return for follow-up appointment in 3 months  Please reach out if you have any questions or concerns.  Warm regards,  Dr. Delon Lenis Dermatology      Important Information  Due to recent changes in healthcare laws, you may see results of your pathology and/or laboratory studies on MyChart before the doctors have had a chance to review them. We understand that in some cases there may be results that are confusing or concerning to you. Please understand that not all results are received at the same time and often the doctors may need to interpret multiple results in order to provide you with the best plan of care or course of treatment. Therefore, we ask that you please give us  2 business days to thoroughly review all your results before contacting the office for clarification. Should we see a critical lab result, you will be contacted sooner.   If You Need Anything After Your Visit  If you have any questions or concerns for your doctor, please call our main line at [phone] If no one answers, please leave a voicemail as directed and we will return your call as soon as possible. Messages left after 4 pm will be answered the following business day.   You may also send us  a message via MyChart. We typically respond to MyChart messages within 1-2  business days.  For prescription refills, please ask your pharmacy to contact our office. Our fax number is [phone].  If you have an urgent issue when the clinic is closed that cannot wait until the next business day, you can page your doctor at the number below.    Please note that while we do our best to be available for urgent issues outside of office hours, we are not available 24/7.   If you have an urgent issue and are unable to reach us , you may choose to seek medical care at your doctor's office, retail clinic, urgent care center, or emergency room.  If you have a medical emergency, please immediately call 911 or go to the emergency department. In the event of inclement weather, please call our main line at [phone] for an update on the status of any delays or closures.  Dermatology Medication Tips: Please keep the boxes that topical medications come in in order to help keep track of the instructions about where and how to use these. Pharmacies typically print the medication instructions only on the boxes and not directly on the medication tubes.   If your medication is too expensive, please contact our office at [phone] or send us  a message through MyChart.   We are unable to tell what your co-pay for medications will be in advance as this is different depending on your insurance  coverage. However, we may be able to find a substitute medication at lower cost or fill out paperwork to get insurance to cover a needed medication.   If a prior authorization is required to get your medication covered by your insurance company, please allow us  1-2 business days to complete this process.  Drug prices often vary depending on where the prescription is filled and some pharmacies may offer cheaper prices.  The website www.goodrx.com contains coupons for medications through different pharmacies. The prices here do not account for what the cost may be with help from insurance (it may  be cheaper with your insurance), but the website can give you the price if you did not use any insurance.  - You can print the associated coupon and take it with your prescription to the pharmacy.  - You may also stop by our office during regular business hours and pick up a GoodRx coupon card.  - If you need your prescription sent electronically to a different pharmacy, notify our office through [facility] or by phone at [phone]

## 2024-09-05 ENCOUNTER — Ambulatory Visit: Payer: MEDICAID | Admitting: Podiatry

## 2024-09-18 ENCOUNTER — Ambulatory Visit: Payer: MEDICAID | Admitting: Podiatry

## 2024-09-18 ENCOUNTER — Encounter: Payer: Self-pay | Admitting: Podiatry

## 2024-09-18 DIAGNOSIS — M778 Other enthesopathies, not elsewhere classified: Secondary | ICD-10-CM

## 2024-09-18 DIAGNOSIS — S9031XD Contusion of right foot, subsequent encounter: Secondary | ICD-10-CM | POA: Diagnosis not present

## 2024-09-18 DIAGNOSIS — M7751 Other enthesopathy of right foot: Secondary | ICD-10-CM | POA: Diagnosis not present

## 2024-09-18 MED ORDER — GABAPENTIN 100 MG PO CAPS: 100.0000 mg | ORAL_CAPSULE | Freq: Every day | ORAL | 1 refills | Status: AC

## 2024-09-18 MED ORDER — MELOXICAM 15 MG PO TABS: 15.0000 mg | ORAL_TABLET | Freq: Every day | ORAL | 1 refills | Status: DC

## 2024-09-18 NOTE — Progress Notes (Signed)
 Chief Complaint  Patient presents with   Foot Pain    F/U extensor tendinitis to the long extensor tendons in the right foot. 6 pain walking and driving. Pain at night when sleeping. Non diabetic. Wearing pneumatic cast and given prednisone .   HPI: 38 y.o. female presents today for follow-up of tendinitis to the right foot.  She is not having as provide of discomfort along the entire dorsal foot as she did on her last visit.  She is pointing mostly around the great toe joint of the right foot as the area of most discomfort today.  She is also having some burning pain to the area that is present at night.  She noted her cam walker was in her car and is in a regular shoe gear for her appointment today.  She thinks the cam walker is no longer securely fastening in the foot area and feels loose.  She is not sure if it has stretched out over time.  She does not feel she has any significant improvement at this time.  Past Medical History:  Diagnosis Date   Asthma    Chronic pelvic pain in female    Endometriosis of the cul-de-sac    stage 1, biopsy proven with laparoscopy 05/2018   History of anemia    Hypertension    not currently being treated at this time- weight loss    Kidney stones    Ovarian torsion 2020   h/o LSO   Pituitary microadenoma with hyperprolactinemia (HCC)    Sickle cell trait    Past Surgical History:  Procedure Laterality Date   DILATION AND CURETTAGE OF UTERUS  03/23/2007   EXCISION OF TONGUE LESION     LAPAROSCOPIC OOPHERECTOMY Left 08/2019   in [place]    LAPAROSCOPY N/A 06/01/2018   Procedure: LAPAROSCOPY DIAGNOSTIC, PERITONEAL BIOPSY;  Surgeon: Starla Harland BROCKS, MD;  Location: [facility];  Service: Gynecology;  Laterality: N/A;   Allergies  Allergen Reactions   Latex Hives, Rash and Other (See Comments)   Mushroom Anaphylaxis   Mushroom Extract Complex (Obsolete) Anaphylaxis   Peanut Butter Flavoring Agent (Non-Screening) Hives   Physical  Exam: Palpable pedal pulses.  There is continued pain with light touch to the dorsal, medial, and plantar aspect of the first metatarsal.  No gaps or nodules are noted within the extensor tendons.  Discomfort noted proximal to the sesamoids.  The medial plantar fascial band is also painful on palpation.  No ecchymosis is appreciated.  Negative Tinel's sign of the deep peroneal nerve today.  Antalgic gait noted on weightbearing.  Assessment/Plan of Care: 1. Capsulitis of metatarsophalangeal (MTP) joint of right foot   2. Extensor tendinitis of foot   3. Contusion of right foot, subsequent encounter     Meds ordered this encounter  Medications   meloxicam (MOBIC) 15 MG tablet    Sig: Take 1 tablet (15 mg total) by mouth daily.    Dispense:  30 tablet    Refill:  1   gabapentin (NEURONTIN) 100 MG capsule    Sig: Take 1 capsule (100 mg total) by mouth at bedtime.    Dispense:  30 capsule    Refill:  1   MR FOOT RIGHT WO CONTRAST -placed for MRI without contrast of the right foot to evaluate FHL and EHL tendon for any pathology or injury as well as ligamentous structures around the first MPJ and capsule.  Will start the patient on meloxicam 15 mg 1  tab p.o. daily.  She needs to avoid any other NSAID if needed.  Also prescribed gabapentin 100 mg nightly for the nerve pain that been occurring at night.  Offered a cortisone injection to the area but patient declined today.  Will have her continue with the pneumatic cam walker.  She was given the name of our Lead Assistant in Clinic to show the damaged camwalker to, to see about an exchange.  We would need to contact the supplier and show them the damage in order to get a replacement at possibly no charge to the patient, if it is indeed defective.    Follow-up after the MRI.  Will keep her with the same work restrictions for now and extended additional 4 weeks from today.  She was informed that it has been taking a few weeks for the radiologist to  read the MRI so that we can see the final report and discuss treatment options   Pegah Segel DSABRA Imperial, DPM, FACFAS [facility]     [address] [place], [place] [address]                Office [phone]  Fax [phone]

## 2024-09-20 ENCOUNTER — Encounter: Payer: Self-pay | Admitting: Podiatry

## 2024-10-01 ENCOUNTER — Other Ambulatory Visit: Payer: MEDICAID

## 2024-10-11 ENCOUNTER — Other Ambulatory Visit: Payer: Self-pay

## 2024-10-11 ENCOUNTER — Encounter (HOSPITAL_COMMUNITY): Payer: Self-pay | Admitting: *Deleted

## 2024-10-11 ENCOUNTER — Emergency Department (HOSPITAL_COMMUNITY)
Admission: EM | Admit: 2024-10-11 | Discharge: 2024-10-12 | Disposition: A | Discharge status: Home / Self Care | Payer: MEDICAID | Attending: Emergency Medicine | Admitting: Emergency Medicine

## 2024-10-11 DIAGNOSIS — Z5321 Procedure and treatment not carried out due to patient leaving prior to being seen by health care provider: Secondary | ICD-10-CM | POA: Diagnosis not present

## 2024-10-11 DIAGNOSIS — R3915 Urgency of urination: Secondary | ICD-10-CM | POA: Insufficient documentation

## 2024-10-11 DIAGNOSIS — R339 Retention of urine, unspecified: Secondary | ICD-10-CM | POA: Insufficient documentation

## 2024-10-11 LAB — LIPASE, BLOOD: Lipase: 57 U/L — ABNORMAL HIGH (ref 11–51)

## 2024-10-11 LAB — CBC
HCT: 36 % (ref 36.0–46.0)
Hemoglobin: 11.9 g/dL — ABNORMAL LOW (ref 12.0–15.0)
MCH: 27.6 pg (ref 26.0–34.0)
MCHC: 33.1 g/dL (ref 30.0–36.0)
MCV: 83.5 fL (ref 80.0–100.0)
Platelets: 283 K/uL (ref 150–400)
RBC: 4.31 MIL/uL (ref 3.87–5.11)
RDW: 12.9 % (ref 11.5–15.5)
WBC: 8.8 K/uL (ref 4.0–10.5)
nRBC: 0 % (ref 0.0–0.2)

## 2024-10-11 LAB — COMPREHENSIVE METABOLIC PANEL WITH GFR
ALT: 13 U/L (ref 0–44)
AST: 16 U/L (ref 15–41)
Albumin: 3.5 g/dL (ref 3.5–5.0)
Alkaline Phosphatase: 74 U/L (ref 38–126)
Anion gap: 10 (ref 5–15)
BUN: 11 mg/dL (ref 6–20)
CO2: 23 mmol/L (ref 22–32)
Calcium: 8.4 mg/dL — ABNORMAL LOW (ref 8.9–10.3)
Chloride: 106 mmol/L (ref 98–111)
Creatinine, Ser: 0.95 mg/dL (ref 0.44–1.00)
GFR, Estimated: >60 mL/min (ref >60)
Glucose, Bld: 108 mg/dL — ABNORMAL HIGH (ref 70–99)
Potassium: 3.6 mmol/L (ref 3.5–5.1)
Sodium: 139 mmol/L (ref 135–145)
Total Bilirubin: 0.3 mg/dL (ref 0.0–1.2)
Total Protein: 6.7 g/dL (ref 6.5–8.1)

## 2024-10-11 LAB — HCG, SERUM, QUALITATIVE: Preg, Serum: NEGATIVE

## 2024-10-11 MED ORDER — OXYCODONE-ACETAMINOPHEN 5-325 MG PO TABS
1.0000 | ORAL_TABLET | Freq: Once | ORAL | Status: AC
  Administered 2024-10-11: 1 via ORAL
  Filled 2024-10-11: qty 1

## 2024-10-11 MED ORDER — ONDANSETRON 4 MG PO TBDP
8.0000 mg | ORAL_TABLET | Freq: Once | ORAL | Status: AC
Start: 2024-10-11 — End: 2024-10-11
  Administered 2024-10-11: 8 mg via ORAL
  Filled 2024-10-11: qty 2

## 2024-10-11 NOTE — ED Triage Notes (Signed)
 Pt report urinary retention, denies previous episode. Last time voided:Last night, pt endorses urgency to urinate.

## 2024-10-11 NOTE — ED Triage Notes (Signed)
 The pt was seen at urgent care on Monday  she was  diagnosed with a urinary tract infection  she has only a small amount of urine in her bladder and she has had vaginal bleeding when she attempts to urinate   lmp always

## 2024-10-12 NOTE — ED Notes (Signed)
 Pt. Did not want to wait. She checked out.

## 2024-10-14 ENCOUNTER — Other Ambulatory Visit: Payer: MEDICAID

## 2024-10-21 NOTE — Progress Notes (Unsigned)
 GYNECOLOGY OFFICE VISIT NOTE  History:  Kathryn Suarez is a 38 y.o. G1P0010 here today for a few concerns.   She was diagnosed with ?UTI on 10/29. She presented with symptoms of urinary retention and dysuria. Given ABX for vaginal infection but stopped taking them as the ABX were making her feel sick. She has not noticed improvement in her symptoms. Notices vaginal soreness.    Normal pelvic US  12/2023.   2019 surgery for endometriosis - confirmed on pathology as well. No scarring noted at that time - reviewed op note and photos in media tab from Dr. Starla.   Has IUD - placed in 2018. She has BTB regularly with this. She has tried OCPs to help with BTB in the past. Periods less heavy and less painful. Has tried OCPs in the past. Did Depo as a teen but tolerated it poorly.   Has microadenoma. Presumed PRL as occasionally elevated. Last check in February 2025 was normal.   She had an ovary removed L/S in 2020 due to ovarian torsion in [place] .   Pap/HPV wnl 05/2021.   Would like to keep options open for child-bearing.   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 Adopted son with her today.   Past Medical History:  Diagnosis Date   Asthma    Chronic pelvic pain in female    Endometriosis of the cul-de-sac    stage 1, biopsy proven with laparoscopy 05/2018   History of anemia    Hypertension    not currently being treated at this time- weight loss    Kidney stones    Ovarian torsion 2020   h/o LSO   Pituitary microadenoma with hyperprolactinemia (HCC)    Sickle cell trait     Past Surgical History:  Procedure Laterality Date   DILATION AND CURETTAGE OF UTERUS  03/23/2007   EXCISION OF TONGUE LESION     LAPAROSCOPIC OOPHERECTOMY Left 08/2019   in [place]    LAPAROSCOPY N/A 06/01/2018   Procedure: LAPAROSCOPY DIAGNOSTIC, PERITONEAL BIOPSY;  Surgeon: Starla Harland BROCKS, MD;  Location: [facility];  Service: Gynecology;  Laterality: N/A;    The following portions of the patient's  history were reviewed and updated as appropriate: allergies, current medications, past family history, past medical history, past social history, past surgical history and problem list.   Health Maintenance:   Normal pap and negative HRHPV: Diagnosis  Date Value Ref Range Status  06/11/2021   Final   - Negative for intraepithelial lesion or malignancy (NILM)    Review of Systems:  Pertinent items noted in HPI and remainder of comprehensive ROS otherwise negative.  Physical Exam:  BP 135/87   Pulse 88   Wt 202 lb 14.4 oz (92 kg)   BMI 35.94 kg/m  CONSTITUTIONAL: Well-developed, well-nourished female in no acute distress.  HEENT:  Normocephalic, atraumatic. External right and left ear normal. No scleral icterus.  NECK: Normal range of motion, supple, no masses noted on observation SKIN: No rash noted. Not diaphoretic. No erythema. No pallor. MUSCULOSKELETAL: Normal range of motion. No edema noted. NEUROLOGIC: Alert and oriented to person, place, and time. Normal muscle tone coordination. No cranial nerve deficit noted. PSYCHIATRIC: Normal mood and affect. Normal behavior. Normal judgment and thought content.  PELVIC: Normal appearing external genitalia; normal urethral meatus; normal appearing vaginal mucosa and cervix.  No abnormal discharge noted. Performed in the presence of a chaperone Assessment and Plan:  1. Acute cystitis without hematuria (Primary) Recheck Ucx given continued urinary symptoms.  -  Urine Culture  2. Endometriosis - IUD is working but BTB. Would like refills of provera  to take for when BTB starts up.  - Prefers to maintain fertility.  - medroxyPROGESTERone  (PROVERA ) 10 MG tablet; Take 1 tablet (10 mg total) by mouth daily.  Dispense: 30 tablet; Refill: 6  3. Breakthrough bleeding with IUD Provera  prn.   4. Chronic vaginitis Will do cultures. Has h/o BV and reports vaginal soreness.  - Cervicovaginal ancillary only( [facility])  5. Cervical cancer  screening Pap done today Patient also requested routine health maintenance blood work.  - Cytology - PAP - CBC - Comprehensive metabolic panel with GFR - TSH - Hemoglobin A1c  6. Pituitary microadenoma with hyperprolactinemia (HCC) Pt with symptoms of HA, visual loss, and nausea. I recommend follow up again with neuro and endocrine. Referrals placed. Recheck PRL.  - Ambulatory referral to Endocrinology - Ambulatory referral to Neurology - Prolactin   Lab gone from the office today. Will bring back for lab visit.    Meds ordered this encounter  Medications   medroxyPROGESTERone  (PROVERA ) 10 MG tablet    Sig: Take 1 tablet (10 mg total) by mouth daily.    Dispense:  30 tablet    Refill:  6     Routine preventative health maintenance measures emphasized. Please refer to After Visit Summary for other counseling recommendations.   No follow-ups on file.  Vina Solian, MD, FACOG Obstetrician & Gynecologist, [facility], [facility]

## 2024-10-23 ENCOUNTER — Other Ambulatory Visit (HOSPITAL_COMMUNITY)
Admission: RE | Admit: 2024-10-23 | Discharge: 2024-10-23 | Disposition: A | Discharge status: Home / Self Care | Payer: MEDICAID | Source: Ambulatory Visit | Attending: Obstetrics and Gynecology | Admitting: Obstetrics and Gynecology

## 2024-10-23 ENCOUNTER — Ambulatory Visit: Payer: MEDICAID | Admitting: Obstetrics and Gynecology

## 2024-10-23 ENCOUNTER — Encounter: Payer: Self-pay | Admitting: Obstetrics and Gynecology

## 2024-10-23 ENCOUNTER — Encounter: Payer: Self-pay | Admitting: Family Medicine

## 2024-10-23 VITALS — BP 135/87 | HR 88 | Wt 202.9 lb

## 2024-10-23 DIAGNOSIS — N3000 Acute cystitis without hematuria: Secondary | ICD-10-CM | POA: Diagnosis not present

## 2024-10-23 DIAGNOSIS — Z124 Encounter for screening for malignant neoplasm of cervix: Secondary | ICD-10-CM | POA: Diagnosis present

## 2024-10-23 DIAGNOSIS — N761 Subacute and chronic vaginitis: Secondary | ICD-10-CM | POA: Diagnosis not present

## 2024-10-23 DIAGNOSIS — N809 Endometriosis, unspecified: Secondary | ICD-10-CM

## 2024-10-23 DIAGNOSIS — D352 Benign neoplasm of pituitary gland: Secondary | ICD-10-CM

## 2024-10-23 DIAGNOSIS — Z975 Presence of (intrauterine) contraceptive device: Secondary | ICD-10-CM

## 2024-10-23 DIAGNOSIS — N921 Excessive and frequent menstruation with irregular cycle: Secondary | ICD-10-CM | POA: Diagnosis not present

## 2024-10-23 DIAGNOSIS — E229 Hyperfunction of pituitary gland, unspecified: Secondary | ICD-10-CM

## 2024-10-23 MED ORDER — MEDROXYPROGESTERONE ACETATE 10 MG PO TABS: 10.0000 mg | ORAL_TABLET | Freq: Every day | ORAL | 6 refills | Status: AC

## 2024-10-24 LAB — CERVICOVAGINAL ANCILLARY ONLY
Bacterial Vaginitis (gardnerella): NEGATIVE
Candida Glabrata: NEGATIVE
Candida Vaginitis: NEGATIVE
Comment: NEGATIVE
Comment: NEGATIVE
Comment: NEGATIVE
Comment: NEGATIVE
Trichomonas: NEGATIVE

## 2024-10-25 ENCOUNTER — Other Ambulatory Visit (INDEPENDENT_AMBULATORY_CARE_PROVIDER_SITE_OTHER): Payer: MEDICAID

## 2024-10-25 ENCOUNTER — Ambulatory Visit: Payer: Self-pay | Admitting: Obstetrics and Gynecology

## 2024-10-25 DIAGNOSIS — Z124 Encounter for screening for malignant neoplasm of cervix: Secondary | ICD-10-CM

## 2024-10-25 DIAGNOSIS — D352 Benign neoplasm of pituitary gland: Secondary | ICD-10-CM

## 2024-10-25 LAB — CYTOLOGY - PAP
Adequacy: ABSENT
Comment: NEGATIVE
Diagnosis: NEGATIVE
High risk HPV: NEGATIVE

## 2024-10-25 LAB — URINE CULTURE

## 2024-10-26 LAB — CBC
Hematocrit: 36.9 % (ref 34.0–46.6)
Hemoglobin: 11.8 g/dL (ref 11.1–15.9)
MCH: 27.8 pg (ref 26.6–33.0)
MCHC: 32 g/dL (ref 31.5–35.7)
MCV: 87 fL (ref 79–97)
Platelets: 250 x10E3/uL (ref 150–450)
RBC: 4.24 x10E6/uL (ref 3.77–5.28)
RDW: 13 % (ref 11.7–15.4)
WBC: 6.4 x10E3/uL (ref 3.4–10.8)

## 2024-10-26 LAB — COMPREHENSIVE METABOLIC PANEL WITH GFR
ALT: 20 IU/L (ref 0–32)
AST: 19 IU/L (ref 0–40)
Albumin: 4.2 g/dL (ref 3.9–4.9)
Alkaline Phosphatase: 75 IU/L (ref 41–116)
BUN/Creatinine Ratio: 14 (ref 9–23)
BUN: 11 mg/dL (ref 6–20)
Bilirubin Total: 0.4 mg/dL (ref 0.0–1.2)
CO2: 21 mmol/L (ref 20–29)
Calcium: 9 mg/dL (ref 8.7–10.2)
Chloride: 103 mmol/L (ref 96–106)
Creatinine, Ser: 0.81 mg/dL (ref 0.57–1.00)
Globulin, Total: 2.4 g/dL (ref 1.5–4.5)
Glucose: 84 mg/dL (ref 70–99)
Potassium: 4 mmol/L (ref 3.5–5.2)
Sodium: 138 mmol/L (ref 134–144)
Total Protein: 6.6 g/dL (ref 6.0–8.5)
eGFR: 96 mL/min/1.73 (ref >59)

## 2024-10-26 LAB — PROLACTIN: Prolactin: 28.7 ng/mL (ref 4.8–33.4)

## 2024-10-26 LAB — HEMOGLOBIN A1C
Est. average glucose Bld gHb Est-mCnc: 103 mg/dL
Hgb A1c MFr Bld: 5.2 % (ref 4.8–5.6)

## 2024-10-26 LAB — TSH: TSH: 2.34 u[IU]/mL (ref 0.450–4.500)

## 2024-11-22 ENCOUNTER — Ambulatory Visit: Payer: MEDICAID | Admitting: Obstetrics and Gynecology

## 2024-12-21 ENCOUNTER — Emergency Department (EMERGENCY_DEPARTMENT_HOSPITAL): Payer: MEDICAID

## 2024-12-21 ENCOUNTER — Encounter (HOSPITAL_COMMUNITY): Payer: Self-pay

## 2024-12-21 ENCOUNTER — Other Ambulatory Visit: Payer: Self-pay

## 2024-12-21 ENCOUNTER — Emergency Department (HOSPITAL_COMMUNITY)
Admission: EM | Admit: 2024-12-21 | Discharge: 2024-12-21 | Disposition: A | Discharge status: Home / Self Care | Payer: MEDICAID | Attending: Emergency Medicine | Admitting: Emergency Medicine

## 2024-12-21 DIAGNOSIS — M79601 Pain in right arm: Secondary | ICD-10-CM | POA: Diagnosis present

## 2024-12-21 DIAGNOSIS — Z91010 Allergy to peanuts: Secondary | ICD-10-CM | POA: Diagnosis not present

## 2024-12-21 DIAGNOSIS — Z91040 Latex allergy status: Secondary | ICD-10-CM | POA: Insufficient documentation

## 2024-12-21 LAB — CBC WITH DIFFERENTIAL/PLATELET
Abs Immature Granulocytes: 0.01 K/uL (ref 0.00–0.07)
Basophils Absolute: 0 K/uL (ref 0.0–0.1)
Basophils Relative: 0 %
Eosinophils Absolute: 0.1 K/uL (ref 0.0–0.5)
Eosinophils Relative: 2 %
HCT: 40.1 % (ref 36.0–46.0)
Hemoglobin: 13 g/dL (ref 12.0–15.0)
Immature Granulocytes: 0 %
Lymphocytes Relative: 25 %
Lymphs Abs: 1.6 K/uL (ref 0.7–4.0)
MCH: 27.3 pg (ref 26.0–34.0)
MCHC: 32.4 g/dL (ref 30.0–36.0)
MCV: 84.1 fL (ref 80.0–100.0)
Monocytes Absolute: 0.3 K/uL (ref 0.1–1.0)
Monocytes Relative: 4 %
Neutro Abs: 4.5 K/uL (ref 1.7–7.7)
Neutrophils Relative %: 69 %
Platelets: 239 K/uL (ref 150–400)
RBC: 4.77 MIL/uL (ref 3.87–5.11)
RDW: 12.5 % (ref 11.5–15.5)
WBC: 6.5 K/uL (ref 4.0–10.5)
nRBC: 0 % (ref 0.0–0.2)

## 2024-12-21 LAB — BASIC METABOLIC PANEL WITH GFR
Anion gap: 10 (ref 5–15)
BUN: 10 mg/dL (ref 6–20)
CO2: 25 mmol/L (ref 22–32)
Calcium: 9.3 mg/dL (ref 8.9–10.3)
Chloride: 101 mmol/L (ref 98–111)
Creatinine, Ser: 0.92 mg/dL (ref 0.44–1.00)
GFR, Estimated: >60 mL/min
Glucose, Bld: 102 mg/dL — ABNORMAL HIGH (ref 70–99)
Potassium: 4.1 mmol/L (ref 3.5–5.1)
Sodium: 136 mmol/L (ref 135–145)

## 2024-12-21 NOTE — Progress Notes (Signed)
 RUE venous duplex has been completed.  Preliminary results given to Aanchal Lal, PA-C.  Results can be found under chart review under CV PROC. 12/21/2024 4:12 PM Milbern Doescher RVT, RDMS

## 2024-12-21 NOTE — ED Triage Notes (Signed)
 Pt referred to ED from UC to r/o DVT in R arm. Pt reports swelling and bruising that started two days ago. Pt denies recent travel. Pt reports hx of DVT, not anticoagulated. Pt denies SOB.

## 2024-12-21 NOTE — Discharge Instructions (Signed)
 As discussed, your evaluation today has been largely reassuring.  But, it is important that you monitor your condition carefully, and do not hesitate to return to the ED if you develop new, or concerning changes in your condition. ? ?Otherwise, please follow-up with your physician for appropriate ongoing care. ? ?

## 2024-12-21 NOTE — ED Provider Notes (Signed)
 " [place] EMERGENCY DEPARTMENT AT [facility] Provider Note   CSN: [id number] Arrival date & time: 12/21/24  1145     Patient presents with: Arm Swelling   Kathryn Suarez is a 39 y.o. female.   HPI Patient presents with right arm swelling at the behest of her physician and urgent care. Patient notes a history of orthopedic injuries, with episodic arthrocentesis.  With past days she has developed increasing arm swelling, which is actually improved currently without specific interventions. However, given her history of orthopedic injuries as well as prior right femoral DVT though she is not currently anticoagulated, after seeing her primary care physician she was sent here for evaluation.    Prior to Admission medications  Medication Sig Start Date End Date Taking? Authorizing Provider  cabergoline  (DOSTINEX ) 0.5 MG tablet Take 0.5 tablets (0.25 mg total) by mouth 2 (two) times a week. 05/14/22   Arnold, James G, MD  cetirizine (ZYRTEC) 10 MG tablet SMARTSIG:1 Tablet(s) By Mouth Every Evening PRN 04/03/21   [provider]  clobetasol  cream (TEMOVATE ) 0.05 % Apply 1 Application topically 2 (two) times daily. Apply to affected areas of flares of body twice daily x 2 weeks. Alternate every 2 weeks with Tacrolimus  ointment until clear. Do not apply to face. 08/29/24   Alm Delon SAILOR, DO  cyclobenzaprine  (FLEXERIL ) 10 MG tablet Take 1 tablet (10 mg total) by mouth 2 (two) times daily as needed for muscle spasms. 04/15/23   Griselda Norris, MD  ELDERBERRY PO Take by mouth.    [provider]  famotidine  (PEPCID ) 20 MG tablet Take 1 tablet (20 mg total) by mouth 2 (two) times daily. 09/14/23   Iva Marty Saltness, MD  gabapentin  (NEURONTIN ) 100 MG capsule Take 1 capsule (100 mg total) by mouth at bedtime. 09/18/24   McCaughan, Dia D, DPM  hydrocortisone 2.5 % cream Apply to affected skin of face/neck/groin 1-2 times daily as needed for itching/rash. 08/04/21   [provider]  hydrOXYzine (ATARAX) 25 MG tablet Take 25 mg by mouth 2 (two) times daily as needed. 04/10/22   [provider]  ibuprofen  (ADVIL ) 800 MG tablet Take 1 tablet (800 mg total) by mouth every 8 (eight) hours as needed. 01/04/20   Arnold, James G, MD  levocetirizine (XYZAL ) 5 MG tablet Take 1 tablet (5 mg total) by mouth in the morning and at bedtime. 09/14/23   Iva Marty Saltness, MD  levonorgestrel  (MIRENA ) 20 MCG/DAY IUD by Intrauterine route. 06/23/21   [provider]  LORazepam  (ATIVAN ) 1 MG tablet Take by mouth. 02/09/22   [provider]  medroxyPROGESTERone  (PROVERA ) 10 MG tablet Take 1 tablet (10 mg total) by mouth daily. 10/23/24   Cleatus Moccasin, MD  Melatonin 1 MG CAPS Take by mouth.    [provider]  montelukast  (SINGULAIR ) 10 MG tablet Take 1 tablet (10 mg total) by mouth at bedtime. 09/14/23 09/18/24  Iva Marty Saltness, MD  ondansetron  (ZOFRAN -ODT) 4 MG disintegrating tablet 4mg  ODT q4 hours prn nausea/vomit 08/14/22   Lenor Hollering, MD  oxyCODONE -acetaminophen  (PERCOCET) 10-325 MG tablet Take 1 tablet by mouth every 6 (six) hours. 12/13/23   [provider]  phentermine (ADIPEX-P) 37.5 MG tablet TAKE 1 TABLET(37.5 MG) BY MOUTH 30 MINUTES BEFORE BREAKFAST 12/07/23   [provider]  PROZAC 20 MG capsule Take 20 mg by mouth every morning. 05/28/22   [provider]  tacrolimus  (PROTOPIC ) 0.1 % ointment Apply topically 2 (two) times daily.  Apply to affected areas of flares of face and body twice daily x 2 weeks. Alternate every 2 weeks with clobetasol  cream  to affected areas of body until clear. 08/29/24   Alm Delon SAILOR, DO  topiramate (TOPAMAX) 50 MG tablet  01/30/21   [provider]  VENTOLIN  HFA 108 (90 Base) MCG/ACT inhaler SMARTSIG:2 Puff(s) By Mouth 4 Times Daily PRN    [provider]    Allergies: Latex, Mushroom, Mushroom extract complex (obsolete), and Peanut butter flavoring  agent (non-screening)    Review of Systems  Updated Vital Signs BP (!) 143/79 (BP Location: Right Arm)   Pulse 93   Temp 98.8 F (37.1 C) (Oral)   Resp 16   Ht 1.6 m (5' 3)   Wt 88.9 kg   SpO2 100%   BMI 34.72 kg/m   Physical Exam Vitals and nursing note reviewed.  Constitutional:      General: She is not in acute distress.    Appearance: She is well-developed.  HENT:     Head: Normocephalic and atraumatic.  Eyes:     Conjunctiva/sclera: Conjunctivae normal.  Cardiovascular:     Rate and Rhythm: Normal rate and regular rhythm.     Pulses: Normal pulses.  Pulmonary:     Effort: Pulmonary effort is normal. No respiratory distress.     Breath sounds: Normal breath sounds. No stridor.  Abdominal:     General: There is no distension.  Musculoskeletal:     Comments: Arms roughly symmetric in size, strength symmetric bilaterally, no discoloration.  Skin:    General: Skin is warm and dry.  Neurological:     Mental Status: She is alert and oriented to person, place, and time.     Cranial Nerves: No cranial nerve deficit.  Psychiatric:        Mood and Affect: Mood normal.     (all labs ordered are listed, but only abnormal results are displayed) Labs Reviewed  BASIC METABOLIC PANEL WITH GFR - Abnormal; Notable for the following components:      Result Value   Glucose, Bld 102 (*)    All other components within normal limits  CBC WITH DIFFERENTIAL/PLATELET    EKG: None  Radiology: UE VENOUS DUPLEX (7am - 5pm) Result Date: 12/21/2024 UPPER VENOUS STUDY  Patient Name:  Kathryn Suarez  Date of Exam:   12/21/2024 Medical Rec #: [id number]         Accession #:    [id number] Date of Birth: 1986/03/09         Patient Gender: F Patient Age:   25 years Exam Location:  [facility] Procedure:      VAS US  UPPER EXTREMITY VENOUS DUPLEX Referring Phys: AANCHAL LAL --------------------------------------------------------------------------------  Indications: Pain, and Swelling  Risk Factors: Per patient - history of LE DVTs. Limitations: Patient movement, holding breath due to pain. Comparison Study: No previous exams Performing Technologist: Jody Hill RVT, RDMS  Examination Guidelines: A complete evaluation includes B-mode imaging, spectral Doppler, color Doppler, and power Doppler as needed of all accessible portions of each vessel. Bilateral testing is considered an integral part of a complete examination. Limited examinations for reoccurring indications may be performed as noted.  Right Findings: +----------+------------+---------+-----------+----------+---------------------+ RIGHT     CompressiblePhasicitySpontaneousProperties       Summary        +----------+------------+---------+-----------+----------+---------------------+ IJV           Full       Yes  Yes                                    +----------+------------+---------+-----------+----------+---------------------+ Subclavian    Full       No        Yes    pulsatile                       +----------+------------+---------+-----------+----------+---------------------+ Axillary      Full       Yes       Yes                                    +----------+------------+---------+-----------+----------+---------------------+ Brachial      Full       Yes       Yes               only one of paired                                                            visualized       +----------+------------+---------+-----------+----------+---------------------+ Radial        Full                                                        +----------+------------+---------+-----------+----------+---------------------+ Ulnar         Full                                                        +----------+------------+---------+-----------+----------+---------------------+ Cephalic      Full                                                         +----------+------------+---------+-----------+----------+---------------------+ Basilic       Full                                                        +----------+------------+---------+-----------+----------+---------------------+  Left Findings: +----------+------------+---------+-----------+----------+-------+ LEFT      CompressiblePhasicitySpontaneousPropertiesSummary +----------+------------+---------+-----------+----------+-------+ Subclavian    Full       Yes                                +----------+------------+---------+-----------+----------+-------+  Summary:  Right: No evidence of deep vein thrombosis in the upper extremity. No evidence of superficial vein thrombosis in the upper extremity.  Left: No evidence of thrombosis in the subclavian.  *See table(s) above for  measurements and observations.     Preliminary      Procedures   Medications Ordered in the ED - No data to display                                  Medical Decision Making Adult female with history of DVT presents with right arm pain and swelling. Patient's neuroexam is reassuring distal pulses are appropriate, no evidence for arterial occlusion.  With risk profile is elevated ultrasound ordered from triage, and I reviewed that exam which is reassuring, no DVT, labs unremarkable, she is afebrile, no cutaneous changes suggesting cellulitis some suspicion for musculoskeletal strain versus strain patient will follow-up with primary care.  Amount and/or Complexity of Data Reviewed External Data Reviewed: notes.    Details: Urgent care note Labs:  Decision-making details documented in ED Course. Radiology: independent interpretation performed. Decision-making details documented in ED Course.  Risk Prescription drug management. Decision regarding hospitalization.    Final diagnoses:  Right arm pain    ED Discharge Orders     None          Garrick Charleston, MD 12/21/24 1711  "

## 2024-12-21 NOTE — ED Provider Triage Note (Signed)
 Emergency Medicine Provider Triage Evaluation Note  Kathryn Suarez , a 39 y.o. female  was evaluated in triage.  Pt complains of right arm swelling and pain.  Was seen by urgent care and sent here to rule out DVT.  History of 2 blood clots.  Not currently on blood thinner but has been in the past.  States she easily bruises.  No injuries or falls. no recent travel.  IUD in place.  No history of cancer.  No leg swelling  Review of Systems  Positive: Right arm pain and swelling Negative: Numbness, weakness  Physical Exam  BP (!) 127/90 (BP Location: Left Arm)   Pulse 99   Temp 98.6 F (37 C) (Oral)   Resp 18   Ht 5' 3 (1.6 m)   Wt 88.9 kg   SpO2 99%   BMI 34.72 kg/m  Gen:   Awake, no distress   Resp:  Normal effort  MSK:   Moves extremities without difficulty  Other:  Radial pulse 2+ bilateral, tender to palpation to right forearm with mild swelling.  Not erythematous, no signs of compartment syndrome.  Full range of motion.  No signs of pallor.  Normal skin perfusion.  Medical Decision Making  Medically screening exam initiated at 1:42 PM.  Appropriate orders placed.  Kathryn Suarez was informed that the remainder of the evaluation will be completed by another provider, this initial triage assessment does not replace that evaluation, and the importance of remaining in the ED until their evaluation is complete.  CBC, BMP, ultrasound of right arm for DVT   Braxton Dubois, PA-C 12/21/24 1356

## 2024-12-26 ENCOUNTER — Ambulatory Visit: Payer: MEDICAID | Admitting: Dermatology

## 2025-03-27 ENCOUNTER — Ambulatory Visit: Payer: MEDICAID | Admitting: Diagnostic Neuroimaging

## 2025-04-19 ENCOUNTER — Ambulatory Visit: Payer: MEDICAID | Admitting: "Endocrinology
